# Patient Record
Sex: Female | Born: 1960 | Race: White | Hispanic: No | Marital: Married | Smoking: Never smoker
Health system: Southern US, Community
[De-identification: ages and names within clinical notes are randomized; demographics above are authoritative.]

## PROBLEM LIST (undated history)

## (undated) DIAGNOSIS — Z9889 Other specified postprocedural states: Secondary | ICD-10-CM

## (undated) DIAGNOSIS — M1990 Unspecified osteoarthritis, unspecified site: Secondary | ICD-10-CM

## (undated) DIAGNOSIS — E079 Disorder of thyroid, unspecified: Secondary | ICD-10-CM

## (undated) DIAGNOSIS — I10 Essential (primary) hypertension: Secondary | ICD-10-CM

## (undated) DIAGNOSIS — R51 Headache: Secondary | ICD-10-CM

## (undated) DIAGNOSIS — R112 Nausea with vomiting, unspecified: Secondary | ICD-10-CM

## (undated) DIAGNOSIS — R002 Palpitations: Secondary | ICD-10-CM

## (undated) HISTORY — PX: KNEE SURGERY: SHX244

## (undated) HISTORY — PX: COLONOSCOPY W/ POLYPECTOMY: SHX1380

## (undated) HISTORY — DX: Disorder of thyroid, unspecified: E07.9

## (undated) HISTORY — PX: APPENDECTOMY: SHX54

## (undated) HISTORY — PX: OTHER SURGICAL HISTORY: SHX169

---

## 2011-10-16 DIAGNOSIS — R3129 Other microscopic hematuria: Secondary | ICD-10-CM | POA: Insufficient documentation

## 2012-08-05 ENCOUNTER — Other Ambulatory Visit: Payer: Self-pay

## 2012-08-05 ENCOUNTER — Encounter (HOSPITAL_COMMUNITY): Payer: Self-pay | Admitting: Emergency Medicine

## 2012-08-05 ENCOUNTER — Encounter (HOSPITAL_COMMUNITY): Payer: Self-pay | Admitting: Anesthesiology

## 2012-08-05 ENCOUNTER — Emergency Department (HOSPITAL_COMMUNITY): Payer: Managed Care, Other (non HMO)

## 2012-08-05 ENCOUNTER — Encounter (HOSPITAL_COMMUNITY): Admission: EM | Disposition: A | Discharge status: Home / Self Care | Payer: Self-pay | Source: Home / Self Care

## 2012-08-05 ENCOUNTER — Inpatient Hospital Stay (HOSPITAL_COMMUNITY)
Admission: EM | Admit: 2012-08-05 | Discharge: 2012-08-08 | DRG: 340 | Disposition: A | Discharge status: Home / Self Care | Payer: Managed Care, Other (non HMO) | Attending: General Surgery | Admitting: General Surgery

## 2012-08-05 ENCOUNTER — Emergency Department (HOSPITAL_COMMUNITY): Payer: Managed Care, Other (non HMO) | Admitting: Anesthesiology

## 2012-08-05 DIAGNOSIS — I10 Essential (primary) hypertension: Secondary | ICD-10-CM | POA: Diagnosis present

## 2012-08-05 DIAGNOSIS — K3533 Acute appendicitis with perforation and localized peritonitis, with abscess: Principal | ICD-10-CM | POA: Diagnosis present

## 2012-08-05 DIAGNOSIS — K352 Acute appendicitis with generalized peritonitis, without abscess: Secondary | ICD-10-CM

## 2012-08-05 DIAGNOSIS — K3580 Unspecified acute appendicitis: Secondary | ICD-10-CM

## 2012-08-05 HISTORY — DX: Other specified postprocedural states: Z98.890

## 2012-08-05 HISTORY — PX: LAPAROSCOPIC APPENDECTOMY: SHX408

## 2012-08-05 HISTORY — DX: Essential (primary) hypertension: I10

## 2012-08-05 HISTORY — DX: Other specified postprocedural states: R11.2

## 2012-08-05 LAB — URINALYSIS, ROUTINE W REFLEX MICROSCOPIC
Glucose, UA: NEGATIVE mg/dL
Leukocytes, UA: NEGATIVE
Protein, ur: NEGATIVE mg/dL
Urobilinogen, UA: 0.2 mg/dL (ref 0.0–1.0)

## 2012-08-05 LAB — COMPREHENSIVE METABOLIC PANEL
ALT: 13 U/L (ref 0–35)
BUN: 9 mg/dL (ref 6–23)
CO2: 23 mEq/L (ref 19–32)
Calcium: 9.7 mg/dL (ref 8.4–10.5)
Creatinine, Ser: 0.64 mg/dL (ref 0.50–1.10)
GFR calc Af Amer: >90 mL/min (ref >90)
GFR calc non Af Amer: >90 mL/min (ref >90)
Glucose, Bld: 97 mg/dL (ref 70–99)

## 2012-08-05 LAB — CBC WITH DIFFERENTIAL/PLATELET
Basophils Absolute: 0 10*3/uL (ref 0.0–0.1)
Basophils Relative: 0 % (ref 0–1)
Hemoglobin: 14.6 g/dL (ref 12.0–15.0)
Lymphocytes Relative: 13 % (ref 12–46)
MCHC: 35.3 g/dL (ref 30.0–36.0)
Monocytes Relative: 7 % (ref 3–12)
Neutro Abs: 10.5 10*3/uL — ABNORMAL HIGH (ref 1.7–7.7)
Neutrophils Relative %: 79 % — ABNORMAL HIGH (ref 43–77)
RBC: 4.57 MIL/uL (ref 3.87–5.11)
WBC: 13.3 10*3/uL — ABNORMAL HIGH (ref 4.0–10.5)

## 2012-08-05 LAB — URINE MICROSCOPIC-ADD ON

## 2012-08-05 SURGERY — APPENDECTOMY, LAPAROSCOPIC
Anesthesia: General | Wound class: Clean Contaminated

## 2012-08-05 MED ORDER — CIPROFLOXACIN IN D5W 400 MG/200ML IV SOLN
400.0000 mg | Freq: Two times a day (BID) | INTRAVENOUS | Status: DC
  Administered 2012-08-06 – 2012-08-08 (×5): 400 mg via INTRAVENOUS
  Filled 2012-08-05 (×6): qty 200

## 2012-08-05 MED ORDER — ENOXAPARIN SODIUM 40 MG/0.4ML ~~LOC~~ SOLN
40.0000 mg | Freq: Every day | SUBCUTANEOUS | Status: DC
  Administered 2012-08-06 – 2012-08-08 (×3): 40 mg via SUBCUTANEOUS
  Filled 2012-08-05 (×3): qty 0.4

## 2012-08-05 MED ORDER — HYDROMORPHONE HCL PF 1 MG/ML IJ SOLN
1.0000 mg | INTRAMUSCULAR | Status: DC | PRN
  Administered 2012-08-05 – 2012-08-08 (×8): 1 mg via INTRAVENOUS
  Filled 2012-08-05 (×8): qty 1

## 2012-08-05 MED ORDER — METRONIDAZOLE IN NACL 5-0.79 MG/ML-% IV SOLN
500.0000 mg | Freq: Three times a day (TID) | INTRAVENOUS | Status: DC
  Administered 2012-08-06 (×2): 500 mg via INTRAVENOUS
  Filled 2012-08-05 (×3): qty 100

## 2012-08-05 MED ORDER — IOHEXOL 300 MG/ML  SOLN
50.0000 mL | Freq: Once | INTRAMUSCULAR | Status: AC | PRN
  Administered 2012-08-05: 50 mL via ORAL

## 2012-08-05 MED ORDER — LACTATED RINGERS IV SOLN: INTRAVENOUS | Status: DC

## 2012-08-05 MED ORDER — NEOSTIGMINE METHYLSULFATE 1 MG/ML IJ SOLN
INTRAMUSCULAR | Status: DC | PRN
  Administered 2012-08-05: 3 mg via INTRAVENOUS

## 2012-08-05 MED ORDER — LACTATED RINGERS IV SOLN
INTRAVENOUS | Status: DC | PRN
  Administered 2012-08-05: 1000 mL via INTRAVENOUS

## 2012-08-05 MED ORDER — CIPROFLOXACIN IN D5W 400 MG/200ML IV SOLN
INTRAVENOUS | Status: AC
  Filled 2012-08-05: qty 200

## 2012-08-05 MED ORDER — HYDROMORPHONE HCL PF 1 MG/ML IJ SOLN
INTRAMUSCULAR | Status: AC
  Filled 2012-08-05: qty 1

## 2012-08-05 MED ORDER — FENTANYL CITRATE 0.05 MG/ML IJ SOLN
INTRAMUSCULAR | Status: DC | PRN
  Administered 2012-08-05 (×2): 50 ug via INTRAVENOUS
  Administered 2012-08-05: 100 ug via INTRAVENOUS
  Administered 2012-08-05: 50 ug via INTRAVENOUS

## 2012-08-05 MED ORDER — METRONIDAZOLE IN NACL 5-0.79 MG/ML-% IV SOLN
500.0000 mg | Freq: Once | INTRAVENOUS | Status: AC
  Administered 2012-08-05: 500 mg via INTRAVENOUS
  Filled 2012-08-05: qty 100

## 2012-08-05 MED ORDER — BUPIVACAINE-EPINEPHRINE 0.25% -1:200000 IJ SOLN
INTRAMUSCULAR | Status: DC | PRN
  Administered 2012-08-05: 20 mL

## 2012-08-05 MED ORDER — FENTANYL CITRATE 0.05 MG/ML IJ SOLN: 100.0000 ug | Freq: Once | INTRAMUSCULAR | Status: DC

## 2012-08-05 MED ORDER — PROMETHAZINE HCL 25 MG/ML IJ SOLN
INTRAMUSCULAR | Status: AC
  Filled 2012-08-05: qty 1

## 2012-08-05 MED ORDER — 0.9 % SODIUM CHLORIDE (POUR BTL) OPTIME
TOPICAL | Status: DC | PRN
  Administered 2012-08-05: 1000 mL

## 2012-08-05 MED ORDER — HYDROMORPHONE HCL PF 1 MG/ML IJ SOLN
0.2500 mg | INTRAMUSCULAR | Status: DC | PRN
  Administered 2012-08-05 (×3): 0.25 mg via INTRAVENOUS

## 2012-08-05 MED ORDER — CIPROFLOXACIN IN D5W 400 MG/200ML IV SOLN
400.0000 mg | INTRAVENOUS | Status: AC
  Administered 2012-08-05: 400 mg via INTRAVENOUS

## 2012-08-05 MED ORDER — SODIUM CHLORIDE 0.9 % IV SOLN
INTRAVENOUS | Status: DC
  Administered 2012-08-05 – 2012-08-06 (×3): via INTRAVENOUS

## 2012-08-05 MED ORDER — DEXAMETHASONE SODIUM PHOSPHATE 4 MG/ML IJ SOLN
INTRAMUSCULAR | Status: DC | PRN
  Administered 2012-08-05: 8 mg via INTRAVENOUS

## 2012-08-05 MED ORDER — METOPROLOL SUCCINATE ER 25 MG PO TB24
25.0000 mg | ORAL_TABLET | Freq: Every morning | ORAL | Status: DC
  Administered 2012-08-06 – 2012-08-08 (×3): 25 mg via ORAL
  Filled 2012-08-05 (×3): qty 1

## 2012-08-05 MED ORDER — PROPOFOL 10 MG/ML IV BOLUS
INTRAVENOUS | Status: DC | PRN
  Administered 2012-08-05: 200 mg via INTRAVENOUS

## 2012-08-05 MED ORDER — ONDANSETRON HCL 4 MG/2ML IJ SOLN: 4.0000 mg | Freq: Four times a day (QID) | INTRAMUSCULAR | Status: DC | PRN

## 2012-08-05 MED ORDER — LIDOCAINE HCL (CARDIAC) 20 MG/ML IV SOLN
INTRAVENOUS | Status: DC | PRN
  Administered 2012-08-05: 80 mg via INTRAVENOUS

## 2012-08-05 MED ORDER — SUCCINYLCHOLINE CHLORIDE 20 MG/ML IJ SOLN
INTRAMUSCULAR | Status: DC | PRN
  Administered 2012-08-05: 120 mg via INTRAVENOUS

## 2012-08-05 MED ORDER — SODIUM CHLORIDE 0.9 % IV SOLN
INTRAVENOUS | Status: DC | PRN
  Administered 2012-08-05: 19:00:00 via INTRAVENOUS

## 2012-08-05 MED ORDER — ONDANSETRON HCL 4 MG PO TABS: 4.0000 mg | ORAL_TABLET | Freq: Four times a day (QID) | ORAL | Status: DC | PRN

## 2012-08-05 MED ORDER — ONDANSETRON HCL 4 MG/2ML IJ SOLN: 4.0000 mg | Freq: Once | INTRAMUSCULAR | Status: DC

## 2012-08-05 MED ORDER — OXYCODONE-ACETAMINOPHEN 5-325 MG PO TABS
1.0000 | ORAL_TABLET | ORAL | Status: DC | PRN
  Filled 2012-08-05: qty 1
  Filled 2012-08-05: qty 2

## 2012-08-05 MED ORDER — ACETAMINOPHEN 10 MG/ML IV SOLN
INTRAVENOUS | Status: AC
  Filled 2012-08-05: qty 100

## 2012-08-05 MED ORDER — ROCURONIUM BROMIDE 100 MG/10ML IV SOLN
INTRAVENOUS | Status: DC | PRN
  Administered 2012-08-05: 5 mg via INTRAVENOUS
  Administered 2012-08-05: 30 mg via INTRAVENOUS

## 2012-08-05 MED ORDER — GLYCOPYRROLATE 0.2 MG/ML IJ SOLN
INTRAMUSCULAR | Status: DC | PRN
  Administered 2012-08-05: 0.4 mg via INTRAVENOUS

## 2012-08-05 MED ORDER — BUPIVACAINE-EPINEPHRINE PF 0.25-1:200000 % IJ SOLN
INTRAMUSCULAR | Status: AC
  Filled 2012-08-05: qty 30

## 2012-08-05 MED ORDER — IOHEXOL 300 MG/ML  SOLN
100.0000 mL | Freq: Once | INTRAMUSCULAR | Status: AC | PRN
  Administered 2012-08-05: 100 mL via INTRAVENOUS

## 2012-08-05 MED ORDER — PROMETHAZINE HCL 25 MG/ML IJ SOLN
6.2500 mg | INTRAMUSCULAR | Status: DC | PRN
  Administered 2012-08-05: 12.5 mg via INTRAVENOUS

## 2012-08-05 MED ORDER — ACETAMINOPHEN 10 MG/ML IV SOLN
INTRAVENOUS | Status: DC | PRN
  Administered 2012-08-05: 1000 mg via INTRAVENOUS

## 2012-08-05 MED ORDER — ONDANSETRON HCL 4 MG/2ML IJ SOLN
INTRAMUSCULAR | Status: DC | PRN
  Administered 2012-08-05: 4 mg via INTRAVENOUS

## 2012-08-05 SURGICAL SUPPLY — 40 items
APPLIER CLIP ROT 10 11.4 M/L (STAPLE)
CANISTER SUCTION 2500CC (MISCELLANEOUS) ×2 IMPLANT
CLIP APPLIE ROT 10 11.4 M/L (STAPLE) IMPLANT
CLOTH BEACON ORANGE TIMEOUT ST (SAFETY) ×2 IMPLANT
CUTTER FLEX LINEAR 45M (STAPLE) ×2 IMPLANT
DECANTER SPIKE VIAL GLASS SM (MISCELLANEOUS) IMPLANT
DERMABOND ADVANCED (GAUZE/BANDAGES/DRESSINGS) ×1
DERMABOND ADVANCED .7 DNX12 (GAUZE/BANDAGES/DRESSINGS) ×1 IMPLANT
DRAIN CHANNEL 19F RND (DRAIN) ×2 IMPLANT
DRAPE LAPAROSCOPIC ABDOMINAL (DRAPES) ×2 IMPLANT
DRAPE WARM FLUID 44X44 (DRAPE) IMPLANT
ELECT REM PT RETURN 9FT ADLT (ELECTROSURGICAL) ×2
ELECTRODE REM PT RTRN 9FT ADLT (ELECTROSURGICAL) ×1 IMPLANT
ENDOLOOP SUT PDS II  0 18 (SUTURE)
ENDOLOOP SUT PDS II 0 18 (SUTURE) IMPLANT
EVACUATOR DRAINAGE 10X20 100CC (DRAIN) ×1 IMPLANT
EVACUATOR SILICONE 100CC (DRAIN) ×1
GLOVE BIOGEL PI IND STRL 7.0 (GLOVE) ×1 IMPLANT
GLOVE BIOGEL PI INDICATOR 7.0 (GLOVE) ×1
GLOVE INDICATOR 8.0 STRL GRN (GLOVE) ×4 IMPLANT
GLOVE SS BIOGEL STRL SZ 8 (GLOVE) ×1 IMPLANT
GLOVE SUPERSENSE BIOGEL SZ 8 (GLOVE) ×1
GOWN STRL NON-REIN LRG LVL3 (GOWN DISPOSABLE) ×2 IMPLANT
GOWN STRL REIN XL XLG (GOWN DISPOSABLE) ×4 IMPLANT
HAND ACTIVATED (MISCELLANEOUS) IMPLANT
KIT BASIN OR (CUSTOM PROCEDURE TRAY) ×2 IMPLANT
PENCIL BUTTON HOLSTER BLD 10FT (ELECTRODE) ×2 IMPLANT
POUCH SPECIMEN RETRIEVAL 10MM (ENDOMECHANICALS) ×2 IMPLANT
RELOAD 45 VASCULAR/THIN (ENDOMECHANICALS) IMPLANT
RELOAD STAPLE TA45 3.5 REG BLU (ENDOMECHANICALS) ×2 IMPLANT
SET IRRIG TUBING LAPAROSCOPIC (IRRIGATION / IRRIGATOR) ×2 IMPLANT
SUT ETHILON 2 0 PS N (SUTURE) ×2 IMPLANT
SUT MNCRL AB 4-0 PS2 18 (SUTURE) ×2 IMPLANT
TOWEL OR 17X26 10 PK STRL BLUE (TOWEL DISPOSABLE) ×2 IMPLANT
TRAY FOLEY CATH 14FRSI W/METER (CATHETERS) ×2 IMPLANT
TRAY LAP CHOLE (CUSTOM PROCEDURE TRAY) ×2 IMPLANT
TROCAR BLADELESS OPT 5 75 (ENDOMECHANICALS) ×2 IMPLANT
TROCAR XCEL 12X100 BLDLESS (ENDOMECHANICALS) ×2 IMPLANT
TROCAR XCEL BLUNT TIP 100MML (ENDOMECHANICALS) ×2 IMPLANT
TUBING INSUFFLATION 10FT LAP (TUBING) ×2 IMPLANT

## 2012-08-05 NOTE — ED Provider Notes (Signed)
History     CSN: [id number]  Arrival date & time 08/05/12  1453   First MD Initiated Contact with Patient 08/05/12 1512      Chief Complaint  Patient presents with  . Abdominal Pain     HPI Patient presents to the emergency department with right lower quadrant abdominal pain which started on Tuesday has progressively worsened.  Patient's had no documented fever.  Has had some anorexia but no nausea or vomiting.  Had a bowel movement this morning that was hard.  Patient was seen in urgent care and sent here for evaluation and workup for appendicitis.  No lab work was done.  Patient has no previous surgical history or significant medical history. Past Medical History  Diagnosis Date  . Hypertension   . PONV (postoperative nausea and vomiting)     Past Surgical History  Procedure Laterality Date  . Cesarean section    . Knee surgery      Left knee - meniscus tear  . Wisdom teeth    . Laparoscopic appendectomy N/A 08/05/2012    Procedure: APPENDECTOMY LAPAROSCOPIC;  Surgeon: Clovis Pu. Cornett, MD;  Location: [facility];  Service: General;  Laterality: N/A;    History reviewed. No pertinent family history.  History  Substance Use Topics  . Smoking status: Never Smoker   . Smokeless tobacco: Never Used  . Alcohol Use: Yes     Comment: occasional    OB History   Grav Para Term Preterm Abortions TAB SAB Ect Mult Living                  Review of Systems All other systems reviewed and are negative Allergies  Penicillins  Home Medications   Current Outpatient Rx  Name  Route  Sig  Dispense  Refill  . metoprolol succinate (TOPROL-XL) 25 MG 24 hr tablet   Oral   Take 25 mg by mouth every morning.         . Sennosides (EX-LAX) 15 MG CHEW   Oral   Chew 30 mg by mouth once.         Marland Kitchen acetaminophen (TYLENOL) 325 MG tablet   Oral   Take 1-2 tablets (325-650 mg total) by mouth every 6 (six) hours as needed.         Marland Kitchen HYDROcodone-acetaminophen (NORCO/VICODIN) 5-325  MG per tablet   Oral   Take 1-2 tablets by mouth every 4 (four) hours as needed.   30 tablet   0   . ondansetron (ZOFRAN) 4 MG tablet   Oral   Take 1 tablet (4 mg total) by mouth every 6 (six) hours as needed for nausea.   15 tablet   0     BP 115/75  Pulse 63  Temp(Src) 97.6 F (36.4 C) (Oral)  Resp 16  Ht 6' (1.829 m)  Wt 215 lb (97.523 kg)  BMI 29.15 kg/m2  SpO2 98%  Physical Exam  Nursing note and vitals reviewed. Constitutional: She is oriented to person, place, and time. She appears well-developed and well-nourished. No distress.  HENT:  Head: Normocephalic and atraumatic.  Eyes: Pupils are equal, round, and reactive to light.  Neck: Normal range of motion.  Cardiovascular: Normal rate and intact distal pulses.   Pulmonary/Chest: No respiratory distress.  Abdominal: Normal appearance. She exhibits no distension. There is tenderness in the right lower quadrant. There is no rebound and no guarding.  Musculoskeletal: Normal range of motion.  Neurological: She is alert and oriented to  person, place, and time. No cranial nerve deficit.  Skin: Skin is warm and dry. No rash noted.  Psychiatric: She has a normal mood and affect. Her behavior is normal.    ED Course  Procedures (including critical care time) Patient did not want any pain medication at the present time. Labs Reviewed    Results for orders placed during the hospital encounter of 08/05/12  COMPREHENSIVE METABOLIC PANEL      Result Value Range   Sodium 139  135 - 145 mEq/L   Potassium 3.5  3.5 - 5.1 mEq/L   Chloride 103  96 - 112 mEq/L   CO2 23  19 - 32 mEq/L   Glucose, Bld 97  70 - 99 mg/dL   BUN 9  6 - 23 mg/dL   Creatinine, Ser 5.78  0.50 - 1.10 mg/dL   Calcium 9.7  8.4 - 46.9 mg/dL   Total Protein 8.0  6.0 - 8.3 g/dL   Albumin 4.2  3.5 - 5.2 g/dL   AST 13  0 - 37 U/L   ALT 13  0 - 35 U/L   Alkaline Phosphatase 78  39 - 117 U/L   Total Bilirubin 0.5  0.3 - 1.2 mg/dL   GFR calc non Af Amer  >90  >90 mL/min   GFR calc Af Amer >90  >90 mL/min  CBC WITH DIFFERENTIAL      Result Value Range   WBC 13.3 (*) 4.0 - 10.5 K/uL   RBC 4.57  3.87 - 5.11 MIL/uL   Hemoglobin 14.6  12.0 - 15.0 g/dL   HCT 62.9  52.8 - 41.3 %   MCV 90.6  78.0 - 100.0 fL   MCH 31.9  26.0 - 34.0 pg   MCHC 35.3  30.0 - 36.0 g/dL   RDW 24.4  01.0 - 27.2 %   Platelets 323  150 - 400 K/uL   Neutrophils Relative % 79 (*) 43 - 77 %   Neutro Abs 10.5 (*) 1.7 - 7.7 K/uL   Lymphocytes Relative 13  12 - 46 %   Lymphs Abs 1.7  0.7 - 4.0 K/uL   Monocytes Relative 7  3 - 12 %   Monocytes Absolute 0.9  0.1 - 1.0 K/uL   Eosinophils Relative 1  0 - 5 %   Eosinophils Absolute 0.2  0.0 - 0.7 K/uL   Basophils Relative 0  0 - 1 %   Basophils Absolute 0.0  0.0 - 0.1 K/uL  LIPASE, BLOOD      Result Value Range   Lipase 32  11 - 59 U/L  URINALYSIS, ROUTINE W REFLEX MICROSCOPIC      Result Value Range   Color, Urine YELLOW  YELLOW   APPearance CLOUDY (*) CLEAR   Specific Gravity, Urine 1.016  1.005 - 1.030   pH 5.0  5.0 - 8.0   Glucose, UA NEGATIVE  NEGATIVE mg/dL   Hgb urine dipstick TRACE (*) NEGATIVE   Bilirubin Urine NEGATIVE  NEGATIVE   Ketones, ur NEGATIVE  NEGATIVE mg/dL   Protein, ur NEGATIVE  NEGATIVE mg/dL   Urobilinogen, UA 0.2  0.0 - 1.0 mg/dL   Nitrite NEGATIVE  NEGATIVE   Leukocytes, UA NEGATIVE  NEGATIVE                Ct Abdomen Pelvis W Contrast  08/05/2012   *RADIOLOGY REPORT*  Clinical Data: Right lower quadrant abdominal pain for 6 days.  The [facility]  CT ABDOMEN AND PELVIS WITH CONTRAST  Technique:  Multidetector CT imaging of the abdomen and pelvis was performed following the standard protocol during bolus administration of intravenous contrast.  Contrast: OMNIPAQUE IOHEXOL 300 MG/ML  SOLN Oral contrast was also administered.  Comparison: None.  Findings: Large appendicolith at the base of the appendix measuring approximately  10 mm.  This causes obstruction of the appendix, with appendiceal dilation up to approximately 15 mm.  Periappendiceal inflammation/edema.  No abnormal fluid collection to suggest abscess.  Normal appearing liver with anatomic variant of the left lobe extends well across midline in the left upper quadrant.  Normal spleen, pancreas, adrenal glands, and kidneys.  Calcified approximate 9 mm gallstone in the otherwise normal-appearing gallbladder.  No biliary ductal dilation.  Mild distal abdominal aortic atherosclerosis.  No significant lymphadenopathy.  Normal-appearing uterus.  Mild enlargement of the right ovary measuring approximately 4.8 x 3.6 cm without a discrete mass. Normal-appearing left ovary.  Several Nabothian cysts on the uterine cervix.  Phleboliths low both sides of the pelvis.  Urinary bladder unremarkable.  Bone window images demonstrate mild lower thoracic spondylosis and degenerative disc disease at L4-5 and L5-S1. Visualized lung bases clear apart from the expected dependent atelectasis posteriorly in the lower lobes and minimal lingular scar.  Heart size normal.  IMPRESSION:  1.  Acute appendicitis.  No evidence of periappendiceal abscess. Large appendicolith at the base of the appendix. 2.  Mildly enlarged right ovary without a discrete mass.  Non- emergent pelvic ultrasound after treatment of the acute appendicitis may be helpful in further evaluation, if the ovary cannot be adequately visualized at the time of surgery. 3.  Cholelithiasis without CT evidence for acute cholecystitis.  These results were called by telephone on 08/05/2012 at 1645 hours to Dr. Radford Pax of the emergency department, who verbally acknowledged these results.   Original Report Authenticated By: Hulan Saas, M.D.      1. Acute appendicitis       MDM  Case discussed with surgery who will come to ED and evaluate the patient.        Nelia Shi, MD 08/16/12 1100

## 2012-08-05 NOTE — ED Notes (Signed)
[id number]<[id number]> Expected date:<BR> Expected time:<BR> Means of arrival:<BR> Comments:<BR> Nicole Campos, sent from urgent care. 52 yo F, appendicitis. 4 days RLQ guarding

## 2012-08-05 NOTE — Anesthesia Preprocedure Evaluation (Addendum)
Anesthesia Evaluation  Patient identified by MRN, date of birth, ID band Patient awake    Reviewed: Allergy & Precautions, H&P , NPO status , Patient's Chart, lab work & pertinent test results  History of Anesthesia Complications (+) PONV  Airway Mallampati: II TM Distance: >3 FB Neck ROM: Full    Dental  (+) Teeth Intact and Dental Advisory Given   Pulmonary neg pulmonary ROS,  breath sounds clear to auscultation  Pulmonary exam normal       Cardiovascular hypertension, Pt. on medications negative cardio ROS  Rhythm:Regular Rate:Normal     Neuro/Psych negative neurological ROS  negative psych ROS   GI/Hepatic negative GI ROS, Neg liver ROS,   Endo/Other  negative endocrine ROS  Renal/GU negative Renal ROS  negative genitourinary   Musculoskeletal negative musculoskeletal ROS (+)   Abdominal   Peds  Hematology negative hematology ROS (+)   Anesthesia Other Findings   Reproductive/Obstetrics                           Anesthesia Physical Anesthesia Plan  ASA: II  Anesthesia Plan: General   Post-op Pain Management:    Induction: Intravenous, Rapid sequence and Cricoid pressure planned  Airway Management Planned: Oral ETT  Additional Equipment:   Intra-op Plan:   Post-operative Plan: Extubation in OR  Informed Consent: I have reviewed the patients History and Physical, chart, labs and discussed the procedure including the risks, benefits and alternatives for the proposed anesthesia with the patient or authorized representative who has indicated his/her understanding and acceptance.   Dental advisory given  Plan Discussed with: CRNA  Anesthesia Plan Comments:         Anesthesia Quick Evaluation

## 2012-08-05 NOTE — ED Notes (Signed)
Pt sent [facility] from [facility].  Pt told to come here for possible appendicitis. Pt states she has had pain since Tuesday. Pt states she has pain in RLQ.  Denies n/v/d.

## 2012-08-05 NOTE — Interval H&P Note (Signed)
History and Physical Interval Note:  08/05/2012 6:20 PM  Nicole Campos  has presented today for surgery, with the diagnosis of acute appendicitis  The various methods of treatment have been discussed with the patient and family. After consideration of risks, benefits and other options for treatment, the patient has consented to  Procedure(s): APPENDECTOMY LAPAROSCOPIC (N/A) as a surgical intervention .  The patient's history has been reviewed, patient examined, no change in status, stable for surgery.  I have reviewed the patient's chart and labs.  Questions were answered to the patient's satisfaction.     [name] A.

## 2012-08-05 NOTE — Op Note (Signed)
Appendectomy, Lap, Procedure Note  Indications: The patient presented with a history of right-sided abdominal pain. A CT revealed findings consistent with acute appendicitis. The procedure has been discussed with the patient.  Alternative therapies have been discussed with the patient.  Operative risks include bleeding,  Infection,  Organ injury,  Nerve injury,  Blood vessel injury,  DVT,  Pulmonary embolism,  Death,  And possible reoperation.  Medical management risks include worsening of present situation.  The success of the procedure is 50 -90 % at treating patients symptoms.  The patient understands and agrees to proceed.  Pre-operative Diagnosis: Acute appendicitis with peritoneal abscess  Post-operative Diagnosis: Acute appendicitis with peritoneal abscess  Surgeon: Harriette Bouillon A.   Assistants: OR  Anesthesia: General endotracheal anesthesia and Local anesthesia 0.25.% bupivacaine, with epinephrine  ASA Class: 2  Procedure Details  The patient was seen again in the Holding Room. The risks, benefits, complications, treatment options, and expected outcomes were discussed with the patient and/or family. The possibilities of reaction to medication, pulmonary aspiration, perforation of viscus, bleeding, recurrent infection, finding a normal appendix, the need for additional procedures, failure to diagnose a condition, and creating a complication requiring transfusion or operation were discussed. There was concurrence with the proposed plan and informed consent was obtained. The site of surgery was properly noted/marked. The patient was taken to Operating Room, identified as Nicole Campos and the procedure verified as Appendectomy. A Time Out was held and the above information confirmed.  The patient was placed in the supine position and general anesthesia was induced, along with placement of orogastric tube, Venodyne boots, and a Foley catheter. The abdomen was prepped and draped in a sterile  fashion. A one centimeter infraumbilical incision was made and the peritoneal cavity was accessed using the OPEN  technique. The pneumoperitoneum was then established to steady pressure of 12 mmHg. A 12 mm port was placed through the umbilical incision. Additional 5 mm cannulas then placed in the left lower quadrant of the abdomen and half way between the umbilicus and pubic symphysis under direct vision. A careful evaluation of the entire abdomen was carried out. The patient was placed in Trendelenburg and left lateral decubitus position. The small intestines were retracted in the cephalad and left lateral direction away from the pelvis and right lower quadrant. The patient was found to have an enlarged and inflamed appendix that was extending into the pelvis. There was  evidence of CONTAINED  Perforation with abscess..  The appendix was carefully dissected. A window was made in the mesoappendix at the base of the appendix. A harmonic scalpel was used across the mesoappendix. The appendix was divided at its base using an endo-GIA stapler. The stump was tenuous but held staples and did not leak. Minimal appendiceal stump was left in place. The appendix was the center of the abscess cavity.   There was no evidence of bleeding, leakage, or complication after division of the appendix.  19 F drain placed in the abscess cavity RLQ. Irrigation was also performed and irrigate suctioned from the abdomen as well.  The umbilical port site was closed using 0 vicryl pursestring sutures fashion at the level of the fascia. The trocar site skin wounds were closed using skin staples.  Instrument, sponge, and needle counts were correct at the conclusion of the case.   Findings: The appendix was found to be inflamed. There were signs of necrosis.  There was perforation. There was abscess formation.  Estimated Blood Loss:  less than 50  mL         Drains: 19 F         Total IV Fluids: 1200 mL         Specimens:  APPENDIX         Complications:  None; patient tolerated the procedure well.         Disposition: PACU - hemodynamically stable.         Condition: stable

## 2012-08-05 NOTE — H&P (Signed)
Nicole Campos is an 52 y.o. female.   Chief Complaint: abdominal pain HPI: 6 day hx of diffuse now RLQ abdominal pain 7/10 located RLQ made worse with movement.  CT shows acute appendicitis without rupture.  Gallstone   Noted without inflammation and right ovary enlarged.  Some dyspepsia.   Past Medical History  Diagnosis Date  . Hypertension   . PONV (postoperative nausea and vomiting)     Past Surgical History  Procedure Laterality Date  . Cesarean section    . Knee surgery      Left knee - meniscus tear  . Wisdom teeth      History reviewed. No pertinent family history. Social History:  reports that she has never smoked. She has never used smokeless tobacco. She reports that  drinks alcohol. She reports that she does not use illicit drugs.  Allergies:  Allergies  Allergen Reactions  . Penicillins Itching     (Not in a hospital admission)  Results for orders placed during the hospital encounter of 08/05/12 (from the past 48 hour(s))  COMPREHENSIVE METABOLIC PANEL     Status: None   Collection Time    08/05/12  3:20 PM      Result Value Range   Sodium 139  135 - 145 mEq/L   Potassium 3.5  3.5 - 5.1 mEq/L   Chloride 103  96 - 112 mEq/L   CO2 23  19 - 32 mEq/L   Glucose, Bld 97  70 - 99 mg/dL   BUN 9  6 - 23 mg/dL   Creatinine, Ser 1.61  0.50 - 1.10 mg/dL   Calcium 9.7  8.4 - 09.6 mg/dL   Total Protein 8.0  6.0 - 8.3 g/dL   Albumin 4.2  3.5 - 5.2 g/dL   AST 13  0 - 37 U/L   ALT 13  0 - 35 U/L   Alkaline Phosphatase 78  39 - 117 U/L   Total Bilirubin 0.5  0.3 - 1.2 mg/dL   GFR calc non Af Amer >90  >90 mL/min   GFR calc Af Amer >90  >90 mL/min   Comment:            The eGFR has been calculated     using the CKD EPI equation.     This calculation has not been     validated in all clinical     situations.     eGFR's persistently     <90 mL/min signify     possible Chronic Kidney Disease.  CBC WITH DIFFERENTIAL     Status: Abnormal   Collection Time    08/05/12   3:20 PM      Result Value Range   WBC 13.3 (*) 4.0 - 10.5 K/uL   RBC 4.57  3.87 - 5.11 MIL/uL   Hemoglobin 14.6  12.0 - 15.0 g/dL   HCT 04.5  40.9 - 81.1 %   MCV 90.6  78.0 - 100.0 fL   MCH 31.9  26.0 - 34.0 pg   MCHC 35.3  30.0 - 36.0 g/dL   RDW 91.4  78.2 - 95.6 %   Platelets 323  150 - 400 K/uL   Neutrophils Relative % 79 (*) 43 - 77 %   Neutro Abs 10.5 (*) 1.7 - 7.7 K/uL   Lymphocytes Relative 13  12 - 46 %   Lymphs Abs 1.7  0.7 - 4.0 K/uL   Monocytes Relative 7  3 - 12 %   Monocytes Absolute  0.9  0.1 - 1.0 K/uL   Eosinophils Relative 1  0 - 5 %   Eosinophils Absolute 0.2  0.0 - 0.7 K/uL   Basophils Relative 0  0 - 1 %   Basophils Absolute 0.0  0.0 - 0.1 K/uL  LIPASE, BLOOD     Status: None   Collection Time    08/05/12  3:20 PM      Result Value Range   Lipase 32  11 - 59 U/L  URINALYSIS, ROUTINE W REFLEX MICROSCOPIC     Status: Abnormal   Collection Time    08/05/12  4:18 PM      Result Value Range   Color, Urine YELLOW  YELLOW   APPearance CLOUDY (*) CLEAR   Specific Gravity, Urine 1.016  1.005 - 1.030   pH 5.0  5.0 - 8.0   Glucose, UA NEGATIVE  NEGATIVE mg/dL   Hgb urine dipstick TRACE (*) NEGATIVE   Bilirubin Urine NEGATIVE  NEGATIVE   Ketones, ur NEGATIVE  NEGATIVE mg/dL   Protein, ur NEGATIVE  NEGATIVE mg/dL   Urobilinogen, UA 0.2  0.0 - 1.0 mg/dL   Nitrite NEGATIVE  NEGATIVE   Leukocytes, UA NEGATIVE  NEGATIVE  URINE MICROSCOPIC-ADD ON     Status: None   Collection Time    08/05/12  4:18 PM      Result Value Range   WBC, UA 0-2  <3 WBC/hpf   Ct Abdomen Pelvis W Contrast  08/05/2012   *RADIOLOGY REPORT*  Clinical Data: Right lower quadrant abdominal pain for 6 days.  The [facility]  CT ABDOMEN AND PELVIS WITH CONTRAST  Technique:  Multidetector CT imaging of the abdomen and pelvis was performed following the standard protocol during bolus administration of intravenous contrast.  Contrast:  OMNIPAQUE IOHEXOL 300 MG/ML  SOLN Oral contrast was also administered.  Comparison: None.  Findings: Large appendicolith at the base of the appendix measuring approximately 10 mm.  This causes obstruction of the appendix, with appendiceal dilation up to approximately 15 mm.  Periappendiceal inflammation/edema.  No abnormal fluid collection to suggest abscess.  Normal appearing liver with anatomic variant of the left lobe extends well across midline in the left upper quadrant.  Normal spleen, pancreas, adrenal glands, and kidneys.  Calcified approximate 9 mm gallstone in the otherwise normal-appearing gallbladder.  No biliary ductal dilation.  Mild distal abdominal aortic atherosclerosis.  No significant lymphadenopathy.  Normal-appearing uterus.  Mild enlargement of the right ovary measuring approximately 4.8 x 3.6 cm without a discrete mass. Normal-appearing left ovary.  Several Nabothian cysts on the uterine cervix.  Phleboliths low both sides of the pelvis.  Urinary bladder unremarkable.  Bone window images demonstrate mild lower thoracic spondylosis and degenerative disc disease at L4-5 and L5-S1. Visualized lung bases clear apart from the expected dependent atelectasis posteriorly in the lower lobes and minimal lingular scar.  Heart size normal.  IMPRESSION:  1.  Acute appendicitis.  No evidence of periappendiceal abscess. Large appendicolith at the base of the appendix. 2.  Mildly enlarged right ovary without a discrete mass.  Non- emergent pelvic ultrasound after treatment of the acute appendicitis may be helpful in further evaluation, if the ovary cannot be adequately visualized at the time of surgery. 3.  Cholelithiasis without CT evidence for acute cholecystitis.  These results were called by telephone on 08/05/2012 at 1645 hours to Dr. Radford Pax of the emergency department, who verbally acknowledged these results.  Original Report Authenticated By: Hulan Saas, M.D.    Review of Systems   Constitutional: Negative for fever and chills.  HENT: Negative.   Eyes: Negative.   Respiratory: Negative.   Gastrointestinal: Positive for abdominal pain.  Genitourinary: Negative.   Musculoskeletal: Negative.   Skin: Negative.   Neurological: Negative.   Endo/Heme/Allergies: Negative.   Psychiatric/Behavioral: Negative.     Blood pressure 144/78, pulse 111, temperature 98.2 F (36.8 C), temperature source Oral, resp. rate 18, SpO2 96.00%. Physical Exam  Constitutional: She is oriented to person, place, and time. She appears well-developed and well-nourished.  HENT:  Head: Normocephalic and atraumatic.  Eyes: EOM are normal. Pupils are equal, round, and reactive to light.  Neck: Normal range of motion. Neck supple.  Cardiovascular: Normal rate and regular rhythm.   Respiratory: Effort normal and breath sounds normal.  GI: There is tenderness in the right lower quadrant. There is rebound and guarding. There is no rigidity.  Musculoskeletal: Normal range of motion.  Neurological: She is alert and oriented to person, place, and time.  Skin: Skin is warm and dry.  Psychiatric: She has a normal mood and affect. Her behavior is normal. Judgment and thought content normal.     Assessment/Plan Acute appendicitis Discussed the disease process,  Treatment options both operative and non operative,  Long term expectations and complications of each.  She would like to proceed with laparoscopic appendectomy.  Risks  Include but not exclusive of bleeding,  Infection,  Organ injury,  Open procedure,  Abscess formation wpound complications  and need for other procedures. She agrees to proceed.  Takiyah Bohnsack A. 08/05/2012, 5:56 PM

## 2012-08-05 NOTE — Transfer of Care (Signed)
Immediate Anesthesia Transfer of Care Note  Patient: Nicole Campos  Procedure(s) Performed: Procedure(s): APPENDECTOMY LAPAROSCOPIC (N/A)  Patient Location: PACU  Anesthesia Type:General  Level of Consciousness: awake, alert , oriented and patient cooperative  Airway & Oxygen Therapy: Patient Spontanous Breathing and Patient connected to face mask oxygen  Post-op Assessment: Report given to PACU RN, Post -op Vital signs reviewed and stable and Patient moving all extremities X 4  Post vital signs: Reviewed and stable  Complications: No apparent anesthesia complications

## 2012-08-05 NOTE — Preoperative (Signed)
Beta Blockers   Reason not to administer Beta Blockers:Not Applicable 

## 2012-08-05 NOTE — Anesthesia Postprocedure Evaluation (Signed)
  Anesthesia Post-op Note  Patient: Nicole Campos  Procedure(s) Performed: Procedure(s): APPENDECTOMY LAPAROSCOPIC (N/A)  Patient Location: PACU  Anesthesia Type:General  Level of Consciousness: awake, alert , oriented and patient cooperative  Airway and Oxygen Therapy: Patient Spontanous Breathing and Patient connected to face mask oxygen  Post-op Pain: mild  Post-op Assessment: Post-op Vital signs reviewed, Patient's Cardiovascular Status Stable, Respiratory Function Stable and Patent Airway  Post-op Vital Signs: Reviewed and stable  Complications: No apparent anesthesia complications

## 2012-08-06 ENCOUNTER — Encounter (HOSPITAL_COMMUNITY): Payer: Self-pay | Admitting: Surgery

## 2012-08-06 LAB — CBC
MCH: 31.7 pg (ref 26.0–34.0)
MCV: 92.1 fL (ref 78.0–100.0)
Platelets: 276 10*3/uL (ref 150–400)
RDW: 12.2 % (ref 11.5–15.5)

## 2012-08-06 LAB — BASIC METABOLIC PANEL
CO2: 27 mEq/L (ref 19–32)
Calcium: 9.1 mg/dL (ref 8.4–10.5)
Creatinine, Ser: 0.66 mg/dL (ref 0.50–1.10)
GFR calc Af Amer: >90 mL/min (ref >90)

## 2012-08-06 MED ORDER — CLINDAMYCIN PHOSPHATE 600 MG/50ML IV SOLN
600.0000 mg | Freq: Four times a day (QID) | INTRAVENOUS | Status: DC
  Administered 2012-08-06 – 2012-08-08 (×8): 600 mg via INTRAVENOUS
  Filled 2012-08-06 (×10): qty 50

## 2012-08-06 NOTE — Progress Notes (Signed)
General Surgery [facility]  Patient seen and examined.  Discussed with Dr. Luisa Hart.  Agree with above note.  Velora Heckler, MD, [facility] Office: [phone]

## 2012-08-06 NOTE — Progress Notes (Signed)
Patient ID: Nicole Campos, female   DOB: [birth year], 52 y.o.   MRN: [id number]  Subjective: Pt feeling a little better than yesterday, up in the bathroom washing up.  Start to pass gas, no bm.  Feels bloated and burping.  No dysuria.  Objective:  Vital signs:  Filed Vitals:   08/05/12 2328 08/06/12 0036 08/06/12 0130 08/06/12 0521  BP: 111/71 106/68 115/68 127/72  Pulse: 73 80 57 63  Temp: 98.1 F (36.7 C) 98 F (36.7 C) 98 F (36.7 C) 97.6 F (36.4 C)  TempSrc: Oral Oral Oral Oral  Resp: 14 16 16 16   Height: 6' (1.829 m)     Weight: 215 lb (97.523 kg)     SpO2: 92% 94% 96% 98%      Intake/Output   Yesterday:  05/26 0701 - 05/27 0700 In: 2762.1 [P.O.:360; I.V.:2302.1; IV Piggyback:100] Out: 925 [Urine:825; Drains:100] This shift:     Physical Exam:  General: Pt awake/alert/oriented x3 in no acute distress Chest: CTA No chest wall pain w good excursion CV:  Pulses intact.  Regular rhythm MS: Normal AROM mjr joints.  No obvious deformity Abdomen: Soft.  Nondistended.  Mildly tender at incisions only.  No evidence of peritonitis.  No incarcerated hernias. Ext:  SCDs BLE.  No mjr edema.  No cyanosis Skin: No petechiae / purpura   Problem List:   Active Problems:   Acute appendicitis with peritoneal abscess    Results:   Labs: Results for orders placed during the hospital encounter of 08/05/12 (from the past 48 hour(s))  COMPREHENSIVE METABOLIC PANEL     Status: None   Collection Time    08/05/12  3:20 PM      Result Value Range   Sodium 139  135 - 145 mEq/L   Potassium 3.5  3.5 - 5.1 mEq/L   Chloride 103  96 - 112 mEq/L   CO2 23  19 - 32 mEq/L   Glucose, Bld 97  70 - 99 mg/dL   BUN 9  6 - 23 mg/dL   Creatinine, Ser 4.09  0.50 - 1.10 mg/dL   Calcium 9.7  8.4 - 81.1 mg/dL   Total Protein 8.0  6.0 - 8.3 g/dL   Albumin 4.2  3.5 - 5.2 g/dL   AST 13  0 - 37 U/L   ALT 13  0 - 35 U/L   Alkaline Phosphatase 78  39 - 117 U/L   Total Bilirubin 0.5  0.3 - 1.2  mg/dL   GFR calc non Af Amer >90  >90 mL/min   GFR calc Af Amer >90  >90 mL/min   Comment:            The eGFR has been calculated     using the CKD EPI equation.     This calculation has not been     validated in all clinical     situations.     eGFR's persistently     <90 mL/min signify     possible Chronic Kidney Disease.  CBC WITH DIFFERENTIAL     Status: Abnormal   Collection Time    08/05/12  3:20 PM      Result Value Range   WBC 13.3 (*) 4.0 - 10.5 K/uL   RBC 4.57  3.87 - 5.11 MIL/uL   Hemoglobin 14.6  12.0 - 15.0 g/dL   HCT 91.4  78.2 - 95.6 %   MCV 90.6  78.0 - 100.0 fL   MCH 31.9  26.0 -  34.0 pg   MCHC 35.3  30.0 - 36.0 g/dL   RDW 16.1  09.6 - 04.5 %   Platelets 323  150 - 400 K/uL   Neutrophils Relative % 79 (*) 43 - 77 %   Neutro Abs 10.5 (*) 1.7 - 7.7 K/uL   Lymphocytes Relative 13  12 - 46 %   Lymphs Abs 1.7  0.7 - 4.0 K/uL   Monocytes Relative 7  3 - 12 %   Monocytes Absolute 0.9  0.1 - 1.0 K/uL   Eosinophils Relative 1  0 - 5 %   Eosinophils Absolute 0.2  0.0 - 0.7 K/uL   Basophils Relative 0  0 - 1 %   Basophils Absolute 0.0  0.0 - 0.1 K/uL  LIPASE, BLOOD     Status: None   Collection Time    08/05/12  3:20 PM      Result Value Range   Lipase 32  11 - 59 U/L  URINALYSIS, ROUTINE W REFLEX MICROSCOPIC     Status: Abnormal   Collection Time    08/05/12  4:18 PM      Result Value Range   Color, Urine YELLOW  YELLOW   APPearance CLOUDY (*) CLEAR   Specific Gravity, Urine 1.016  1.005 - 1.030   pH 5.0  5.0 - 8.0   Glucose, UA NEGATIVE  NEGATIVE mg/dL   Hgb urine dipstick TRACE (*) NEGATIVE   Bilirubin Urine NEGATIVE  NEGATIVE   Ketones, ur NEGATIVE  NEGATIVE mg/dL   Protein, ur NEGATIVE  NEGATIVE mg/dL   Urobilinogen, UA 0.2  0.0 - 1.0 mg/dL   Nitrite NEGATIVE  NEGATIVE   Leukocytes, UA NEGATIVE  NEGATIVE  URINE MICROSCOPIC-ADD ON     Status: None   Collection Time    08/05/12  4:18 PM      Result Value Range   WBC, UA 0-2  <3 WBC/hpf  CBC      Status: Abnormal   Collection Time    08/06/12  4:40 AM      Result Value Range   WBC 11.4 (*) 4.0 - 10.5 K/uL   RBC 4.17  3.87 - 5.11 MIL/uL   Hemoglobin 13.2  12.0 - 15.0 g/dL   HCT 40.9  81.1 - 91.4 %   MCV 92.1  78.0 - 100.0 fL   MCH 31.7  26.0 - 34.0 pg   MCHC 34.4  30.0 - 36.0 g/dL   RDW 78.2  95.6 - 21.3 %   Platelets 276  150 - 400 K/uL  BASIC METABOLIC PANEL     Status: Abnormal   Collection Time    08/06/12  4:40 AM      Result Value Range   Sodium 138  135 - 145 mEq/L   Potassium 4.0  3.5 - 5.1 mEq/L   Chloride 103  96 - 112 mEq/L   CO2 27  19 - 32 mEq/L   Glucose, Bld 162 (*) 70 - 99 mg/dL   BUN 9  6 - 23 mg/dL   Creatinine, Ser 0.86  0.50 - 1.10 mg/dL   Calcium 9.1  8.4 - 57.8 mg/dL   GFR calc non Af Amer >90  >90 mL/min   GFR calc Af Amer >90  >90 mL/min   Comment:            The eGFR has been calculated     using the CKD EPI equation.     This calculation has not been  validated in all clinical     situations.     eGFR's persistently     <90 mL/min signify     possible Chronic Kidney Disease.    Imaging / Studies: Ct Abdomen Pelvis W Contrast  08/05/2012   *RADIOLOGY REPORT*  Clinical Data: Right lower quadrant abdominal pain for 6 days.  The patient sent to the emergency department from an [facility].  CT ABDOMEN AND PELVIS WITH CONTRAST  Technique:  Multidetector CT imaging of the abdomen and pelvis was performed following the standard protocol during bolus administration of intravenous contrast.  Contrast: OMNIPAQUE IOHEXOL 300 MG/ML  SOLN Oral contrast was also administered.  Comparison: None.  Findings: Large appendicolith at the base of the appendix measuring approximately 10 mm.  This causes obstruction of the appendix, with appendiceal dilation up to approximately 15 mm.  Periappendiceal inflammation/edema.  No abnormal fluid collection to suggest abscess.  Normal appearing liver with anatomic variant of the left lobe extends well  across midline in the left upper quadrant.  Normal spleen, pancreas, adrenal glands, and kidneys.  Calcified approximate 9 mm gallstone in the otherwise normal-appearing gallbladder.  No biliary ductal dilation.  Mild distal abdominal aortic atherosclerosis.  No significant lymphadenopathy.  Normal-appearing uterus.  Mild enlargement of the right ovary measuring approximately 4.8 x 3.6 cm without a discrete mass. Normal-appearing left ovary.  Several Nabothian cysts on the uterine cervix.  Phleboliths low both sides of the pelvis.  Urinary bladder unremarkable.  Bone window images demonstrate mild lower thoracic spondylosis and degenerative disc disease at L4-5 and L5-S1. Visualized lung bases clear apart from the expected dependent atelectasis posteriorly in the lower lobes and minimal lingular scar.  Heart size normal.  IMPRESSION:  1.  Acute appendicitis.  No evidence of periappendiceal abscess. Large appendicolith at the base of the appendix. 2.  Mildly enlarged right ovary without a discrete mass.  Non- emergent pelvic ultrasound after treatment of the acute appendicitis may be helpful in further evaluation, if the ovary cannot be adequately visualized at the time of surgery. 3.  Cholelithiasis without CT evidence for acute cholecystitis.  These results were called by telephone on 08/05/2012 at 1645 hours to Dr. Radford Pax of the emergency department, who verbally acknowledged these results.   Original Report Authenticated By: Hulan Saas, M.D.    Medications / Allergies: per chart  Antibiotics: Anti-infectives   Start     Dose/Rate Route Frequency Ordered Stop   08/06/12 0800  ciprofloxacin (CIPRO) IVPB 400 mg     400 mg 200 mL/hr over 60 Minutes Intravenous Every 12 hours 08/05/12 2245     08/06/12 0600  ciprofloxacin (CIPRO) IVPB 400 mg     400 mg 200 mL/hr over 60 Minutes Intravenous On call to O.R. 08/05/12 1928 08/06/12 0451   08/06/12 0400  metroNIDAZOLE (FLAGYL) IVPB 500 mg     500  mg 100 mL/hr over 60 Minutes Intravenous Every 8 hours 08/05/12 2245     08/05/12 1815  [MAR Hold]  metroNIDAZOLE (FLAGYL) IVPB 500 mg     (On MAR Hold since 08/05/12 1938)   500 mg 100 mL/hr over 60 Minutes Intravenous  Once 08/05/12 1804 08/05/12 2011      Assessment/Plan Lenna Gilford  52 y.o. female with acute appendicitis with peritoneal abscess 1 Day Post-Op  Procedure(s): APPENDECTOMY LAPAROSCOPIC -Drain total out 5/27 -cipro/flagyl IV -IVF -full liquid diet -Pain control -IS -CBC in AM  VTE prophylaxis -ambulate, SCDs, lovenox   Khang Hannum  Simisola Sandles, [facility] Pager [phone] Office [phone]  08/06/2012

## 2012-08-06 NOTE — Care Management Note (Signed)
    Page 1 of 1   08/06/2012     4:30:45 PM   CARE MANAGEMENT NOTE 08/06/2012  Patient:  Nicole Campos, Nicole Campos   Account Number:  [id number]  Date Initiated:  08/06/2012  Documentation initiated by:  Colleen Can  Subjective/Objective Assessment:   dx acute appendicitis     Action/Plan:   Cm spoke with patient and family. Plans are for her to return to her home where spouse will be caregiver. States she is ambulatory.   Anticipated DC Date:  08/08/2012   Anticipated DC Plan:  HOME/SELF CARE         Choice offered to / List presented to:             Status of service:  In process, will continue to follow Medicare Important Message given?   (If response is "NO", the following Medicare IM given date fields will be blank) Date Medicare IM given:   Date Additional Medicare IM given:    Discharge Disposition:    Per UR Regulation:  Reviewed for med. necessity/level of care/duration of stay  If discussed at Long Length of Stay Meetings, dates discussed:    Comments:

## 2012-08-07 LAB — CBC
MCHC: 32.7 g/dL (ref 30.0–36.0)
MCV: 92.5 fL (ref 78.0–100.0)
Platelets: 259 10*3/uL (ref 150–400)
RDW: 12.5 % (ref 11.5–15.5)
WBC: 10.9 10*3/uL — ABNORMAL HIGH (ref 4.0–10.5)

## 2012-08-07 MED ORDER — ACETAMINOPHEN 325 MG PO TABS
325.0000 mg | ORAL_TABLET | Freq: Four times a day (QID) | ORAL | Status: DC | PRN
  Administered 2012-08-07 – 2012-08-08 (×2): 650 mg via ORAL
  Filled 2012-08-07 (×2): qty 2

## 2012-08-07 NOTE — Progress Notes (Signed)
Patient ID: Nicole Campos, female   DOB: 04/05/1960, 52 y.o.   MRN: [id number]  Subjective: Still feels bloated, but better than yesterday.  No bm +flatus.  Tolerating regular diet.  States she is not having n/v with pain medication and is tolerating fine.  She is walking in the hallways.  Objective:  Vital signs:  Filed Vitals:   08/06/12 1427 08/06/12 1800 08/06/12 2146 08/07/12 0611  BP: 111/66 112/70 113/73 100/65  Pulse: 69 70 62 67  Temp: 97.8 F (36.6 C) 98.1 F (36.7 C) 97.8 F (36.6 C) 98.3 F (36.8 C)  TempSrc: Oral Oral Oral Oral  Resp: 16 18 18 18   Height:      Weight:      SpO2: 98% 99% 98% 98%    Last BM Date: 08/05/12  Intake/Output   Yesterday:  05/27 0701 - 05/28 0700 In: 3453.3 [P.O.:960; I.V.:1943.3; IV Piggyback:550] Out: 2370 [Urine:2300; Drains:70] This shift:     Bowel function:  Drain: RLQ JP drain, draining serosanguinous output 118ml--->70ml   Physical Exam: General: Pt awake/alert/oriented x3 in no acute distress  Chest: CTA No chest wall pain w good excursion.  IS 2500 CV: S1S2 RR no murmurs, gallops or rubs.  +2 distal pulses, no edema or cyanosis MS: Normal AROM mjr joints. No obvious deformity  Abdomen: Soft. Mildly distended, +bs x4 quadrants. Mildly tender at incisions only. No evidence of peritonitis. No incarcerated hernias.  Ext: SCDs BLE. No mjr edema. No cyanosis  Skin: No petechiae / purpura   Problem List:   Active Problems:   Acute appendicitis with peritoneal abscess   Results:   Labs: Results for orders placed during the hospital encounter of 08/05/12 (from the past 48 hour(s))  COMPREHENSIVE METABOLIC PANEL     Status: None   Collection Time    08/05/12  3:20 PM      Result Value Range   Sodium 139  135 - 145 mEq/L   Potassium 3.5  3.5 - 5.1 mEq/L   Chloride 103  96 - 112 mEq/L   CO2 23  19 - 32 mEq/L   Glucose, Bld 97  70 - 99 mg/dL   BUN 9  6 - 23 mg/dL   Creatinine, Ser 4.09  0.50 - 1.10 mg/dL   Calcium  9.7  8.4 - 81.1 mg/dL   Total Protein 8.0  6.0 - 8.3 g/dL   Albumin 4.2  3.5 - 5.2 g/dL   AST 13  0 - 37 U/L   ALT 13  0 - 35 U/L   Alkaline Phosphatase 78  39 - 117 U/L   Total Bilirubin 0.5  0.3 - 1.2 mg/dL   GFR calc non Af Amer >90  >90 mL/min   GFR calc Af Amer >90  >90 mL/min   Comment:            The eGFR has been calculated     using the CKD EPI equation.     This calculation has not been     validated in all clinical     situations.     eGFR's persistently     <90 mL/min signify     possible Chronic Kidney Disease.  CBC WITH DIFFERENTIAL     Status: Abnormal   Collection Time    08/05/12  3:20 PM      Result Value Range   WBC 13.3 (*) 4.0 - 10.5 K/uL   RBC 4.57  3.87 - 5.11 MIL/uL   Hemoglobin 14.6  12.0 - 15.0 g/dL   HCT 82.9  56.2 - 13.0 %   MCV 90.6  78.0 - 100.0 fL   MCH 31.9  26.0 - 34.0 pg   MCHC 35.3  30.0 - 36.0 g/dL   RDW 86.5  78.4 - 69.6 %   Platelets 323  150 - 400 K/uL   Neutrophils Relative % 79 (*) 43 - 77 %   Neutro Abs 10.5 (*) 1.7 - 7.7 K/uL   Lymphocytes Relative 13  12 - 46 %   Lymphs Abs 1.7  0.7 - 4.0 K/uL   Monocytes Relative 7  3 - 12 %   Monocytes Absolute 0.9  0.1 - 1.0 K/uL   Eosinophils Relative 1  0 - 5 %   Eosinophils Absolute 0.2  0.0 - 0.7 K/uL   Basophils Relative 0  0 - 1 %   Basophils Absolute 0.0  0.0 - 0.1 K/uL  LIPASE, BLOOD     Status: None   Collection Time    08/05/12  3:20 PM      Result Value Range   Lipase 32  11 - 59 U/L  URINALYSIS, ROUTINE W REFLEX MICROSCOPIC     Status: Abnormal   Collection Time    08/05/12  4:18 PM      Result Value Range   Color, Urine YELLOW  YELLOW   APPearance CLOUDY (*) CLEAR   Specific Gravity, Urine 1.016  1.005 - 1.030   pH 5.0  5.0 - 8.0   Glucose, UA NEGATIVE  NEGATIVE mg/dL   Hgb urine dipstick TRACE (*) NEGATIVE   Bilirubin Urine NEGATIVE  NEGATIVE   Ketones, ur NEGATIVE  NEGATIVE mg/dL   Protein, ur NEGATIVE  NEGATIVE mg/dL   Urobilinogen, UA 0.2  0.0 - 1.0 mg/dL    Nitrite NEGATIVE  NEGATIVE   Leukocytes, UA NEGATIVE  NEGATIVE  URINE MICROSCOPIC-ADD ON     Status: None   Collection Time    08/05/12  4:18 PM      Result Value Range   WBC, UA 0-2  <3 WBC/hpf  CBC     Status: Abnormal   Collection Time    08/06/12  4:40 AM      Result Value Range   WBC 11.4 (*) 4.0 - 10.5 K/uL   RBC 4.17  3.87 - 5.11 MIL/uL   Hemoglobin 13.2  12.0 - 15.0 g/dL   HCT 29.5  28.4 - 13.2 %   MCV 92.1  78.0 - 100.0 fL   MCH 31.7  26.0 - 34.0 pg   MCHC 34.4  30.0 - 36.0 g/dL   RDW 44.0  10.2 - 72.5 %   Platelets 276  150 - 400 K/uL  BASIC METABOLIC PANEL     Status: Abnormal   Collection Time    08/06/12  4:40 AM      Result Value Range   Sodium 138  135 - 145 mEq/L   Potassium 4.0  3.5 - 5.1 mEq/L   Chloride 103  96 - 112 mEq/L   CO2 27  19 - 32 mEq/L   Glucose, Bld 162 (*) 70 - 99 mg/dL   BUN 9  6 - 23 mg/dL   Creatinine, Ser 3.66  0.50 - 1.10 mg/dL   Calcium 9.1  8.4 - 44.0 mg/dL   GFR calc non Af Amer >90  >90 mL/min   GFR calc Af Amer >90  >90 mL/min   Comment:  The eGFR has been calculated     using the CKD EPI equation.     This calculation has not been     validated in all clinical     situations.     eGFR's persistently     <90 mL/min signify     possible Chronic Kidney Disease.  CBC     Status: Abnormal   Collection Time    08/07/12  4:15 AM      Result Value Range   WBC 10.9 (*) 4.0 - 10.5 K/uL   RBC 3.87  3.87 - 5.11 MIL/uL   Hemoglobin 11.7 (*) 12.0 - 15.0 g/dL   HCT 57.8 (*) 46.9 - 62.9 %   MCV 92.5  78.0 - 100.0 fL   MCH 30.2  26.0 - 34.0 pg   MCHC 32.7  30.0 - 36.0 g/dL   RDW 52.8  41.3 - 24.4 %   Platelets 259  150 - 400 K/uL    Medications / Allergies: per chart  Antibiotics: Anti-infectives   Start     Dose/Rate Route Frequency Ordered Stop   08/06/12 1400  clindamycin (CLEOCIN) IVPB 600 mg     600 mg 100 mL/hr over 30 Minutes Intravenous Every 6 hours 08/06/12 1326     08/06/12 0800  ciprofloxacin (CIPRO)  IVPB 400 mg     400 mg 200 mL/hr over 60 Minutes Intravenous Every 12 hours 08/05/12 2245     08/06/12 0600  ciprofloxacin (CIPRO) IVPB 400 mg     400 mg 200 mL/hr over 60 Minutes Intravenous On call to O.R. 08/05/12 1928 08/06/12 0451   08/06/12 0400  metroNIDAZOLE (FLAGYL) IVPB 500 mg  Status:  Discontinued     500 mg 100 mL/hr over 60 Minutes Intravenous Every 8 hours 08/05/12 2245 08/06/12 1326   08/05/12 1815  [MAR Hold]  metroNIDAZOLE (FLAGYL) IVPB 500 mg     (On MAR Hold since 08/05/12 1938)   500 mg 100 mL/hr over 60 Minutes Intravenous  Once 08/05/12 1804 08/05/12 2011      Assessment/Plan  Nicole Campos 52 y.o. female with acute appendicitis with peritoneal abscess  1 Day Post-Op Procedure(s):  APPENDECTOMY LAPAROSCOPIC  -Drain 122ml--->70ml/24h serosanguinous output.  WBC trending down, overall pt is improving.  -cipro, flagyl changed to clindamycin due to national shortage.   -DC IVF as pt tolerating regular diet. -Pain control  -IS  -plan for discharge tomorrow or Friday   VTE prophylaxis  -ambulate, SCDs, lovenox  Ashok Norris, [facility] Pager [phone] Office [phone]  08/07/2012 8:42 AM

## 2012-08-07 NOTE — Progress Notes (Signed)
Pt states that Percocet makes her 'sick to her stomach'.  No oral pain medicine given.  Told pt to tell MD in the am.  Will continue to monitor.  Nicole Campos

## 2012-08-07 NOTE — Progress Notes (Signed)
Spoke to [place], NP for [facility], informed her patient request tylenol, states percocet too strong and makes her sick feeling.

## 2012-08-07 NOTE — Progress Notes (Signed)
General Surgery [facility]  Patient seen and examined.  Making good progress - tolerating regular diet.  Serosanguinous in JP drain.  Anticipate discharge tomorrow.  Velora Heckler, MD, [facility] Office: [phone]

## 2012-08-08 MED ORDER — HYDROCODONE-ACETAMINOPHEN 5-325 MG PO TABS: 1.0000 | ORAL_TABLET | ORAL | Status: DC | PRN

## 2012-08-08 MED ORDER — ACETAMINOPHEN 325 MG PO TABS: 325.0000 mg | ORAL_TABLET | Freq: Four times a day (QID) | ORAL | Status: DC | PRN

## 2012-08-08 MED ORDER — ONDANSETRON HCL 4 MG PO TABS: 4.0000 mg | ORAL_TABLET | Freq: Four times a day (QID) | ORAL | Status: DC | PRN

## 2012-08-08 MED ORDER — CLINDAMYCIN HCL 300 MG PO CAPS: 300.0000 mg | ORAL_CAPSULE | Freq: Three times a day (TID) | ORAL | Status: DC

## 2012-08-08 MED ORDER — CIPROFLOXACIN HCL 500 MG PO TABS: 500.0000 mg | ORAL_TABLET | Freq: Two times a day (BID) | ORAL | Status: AC

## 2012-08-08 MED ORDER — HYDROCODONE-ACETAMINOPHEN 5-325 MG PO TABS
1.0000 | ORAL_TABLET | ORAL | Status: DC | PRN
  Administered 2012-08-08: 2 via ORAL
  Filled 2012-08-08: qty 2

## 2012-08-08 MED ORDER — CLINDAMYCIN HCL 300 MG PO CAPS: 300.0000 mg | ORAL_CAPSULE | Freq: Three times a day (TID) | ORAL | Status: AC

## 2012-08-08 MED ORDER — CIPROFLOXACIN HCL 500 MG PO TABS: 500.0000 mg | ORAL_TABLET | Freq: Two times a day (BID) | ORAL | Status: DC

## 2012-08-08 NOTE — Discharge Summary (Signed)
General Surgery [facility]  Patient seen and examined.  Drain removed.  Continue po abx for 5 more days.  Follow up at [facility] office as scheduled.  Velora Heckler, MD, [facility] Office: [phone]

## 2012-08-08 NOTE — Progress Notes (Signed)
3 Days Post-Op  Subjective: Pt feeling much better today.  Had a bm, tolerating a regular diet.  She complains of nausea with percocet would like to try vicodin instead.  Objective: Vital signs in last 24 hours: Temp:  [97.6 F (36.4 C)-98 F (36.7 C)] 97.6 F (36.4 C) (05/29 0557) Pulse Rate:  [63-69] 63 (05/29 0557) Resp:  [16-20] 16 (05/29 0557) BP: (113-133)/(73-83) 115/75 mmHg (05/29 0557) SpO2:  [97 %-99 %] 98 % (05/29 0557) Last BM Date: 08/04/12  Intake/Output from previous day: 05/28 0701 - 05/29 0700 In: 1860 [P.O.:1560; IV Piggyback:300] Out: 2745 [Urine:2700; Drains:45] Intake/Output this shift:    General appearance: alert, cooperative and no distress Cardio: regular rate and rhythm, S1, S2 normal, no murmur, click, rub or gallop GI: soft round and mildly tender at incision sites.  +BS x4 quadrants.  No masses or hernias.  Incisons are clean dry and intact.  RLQ JP drain with serosanginous output.  Lab Results:   Recent Labs  08/06/12 0440 08/07/12 0415  WBC 11.4* 10.9*  HGB 13.2 11.7*  HCT 38.4 35.8*  PLT 276 259   BMET  Recent Labs  08/05/12 1520 08/06/12 0440  NA 139 138  K 3.5 4.0  CL 103 103  CO2 23 27  GLUCOSE 97 162*  BUN 9 9  CREATININE 0.64 0.66  CALCIUM 9.7 9.1    Anti-infectives: Anti-infectives   Start     Dose/Rate Route Frequency Ordered Stop   08/08/12 0000  ciprofloxacin (CIPRO) 500 MG tablet     500 mg Oral 2 times daily 08/08/12 0824     08/08/12 0000  clindamycin (CLEOCIN) 300 MG capsule     300 mg Oral 3 times daily 08/08/12 0824     08/06/12 1400  clindamycin (CLEOCIN) IVPB 600 mg     600 mg 100 mL/hr over 30 Minutes Intravenous Every 6 hours 08/06/12 1326     08/06/12 0800  ciprofloxacin (CIPRO) IVPB 400 mg     400 mg 200 mL/hr over 60 Minutes Intravenous Every 12 hours 08/05/12 2245     08/06/12 0600  ciprofloxacin (CIPRO) IVPB 400 mg     400 mg 200 mL/hr over 60 Minutes Intravenous On call to O.R. 08/05/12 1928  08/06/12 0451   08/06/12 0400  metroNIDAZOLE (FLAGYL) IVPB 500 mg  Status:  Discontinued     500 mg 100 mL/hr over 60 Minutes Intravenous Every 8 hours 08/05/12 2245 08/06/12 1326   08/05/12 1815  [MAR Hold]  metroNIDAZOLE (FLAGYL) IVPB 500 mg     (On MAR Hold since 08/05/12 1938)   500 mg 100 mL/hr over 60 Minutes Intravenous  Once 08/05/12 1804 08/05/12 2011      Assessment/Plan: Nicole Campos 52 y.o. female with acute appendicitis with peritoneal abscess  3 Day Post-Op Procedure(s):  APPENDECTOMY LAPAROSCOPIC  -33ml--->45ml/24h of serosanguinous.  Will likely need to go home with drain, but Dr. Gerrit Friends to make the final decision. -give norco instead of percocet, but she will likely have the same symptoms.  She will take tylenol and motrin along with ice packs to help with pain.  I will also rx zofran prn for nausea -cipro and clinda, she has received 2 days thus far, rx for PO -discharge summary to follow     LOS: 3 days    Bonner Puna [facility] ANP-BC Pager [phone]  08/08/2012 8:33 AM

## 2012-08-08 NOTE — Discharge Summary (Signed)
Physician Discharge Summary  Patient ID: Nicole Campos MRN: [id number] DOB/AGE: 1960-06-06 52 y.o.  Admit date: 08/05/2012 Discharge date: 08/08/2012  Admitting Diagnosis: Acute Appendicitis  Discharge Diagnosis Patient Active Problem List   Diagnosis Date Noted  . Acute appendicitis with peritoneal abscess 08/05/2012    Consultants none  Imaging: CT abdomen and Pelvis:  1. Acute appendicitis. No evidence of periappendiceal abscess.  Large appendicolith at the base of the appendix.  2. Mildly enlarged right ovary without a discrete mass. Non-  emergent pelvic ultrasound after treatment of the acute  appendicitis may be helpful in further evaluation, if the ovary  cannot be adequately visualized at the time of surgery.  3. Cholelithiasis without CT evidence for acute cholecystitis.   Procedures Laparoscopic Appendectomy with drain placement  Hospital Course:  [name] is a 52 year old caucasian female with a history of hypertension  who presented to [facility] with RLQ pain.  Workup showed appendicitis without rupture.  Patient was admitted and underwent a laparoscopic appendectomy a 33F drain was left at abscess site, there was no evidence of perforation.  She tolerated the procedure well and was transferred to the floor.  Diet was advanced as tolerated.  The drain continued to drain serosanguinous output without evidence of purulent drainage.  On POD #3 the patient was voiding well, tolerating diet, ambulating well, pain well controlled, vital signs stable, incisions c/d/i and felt stable for discharge home.  The percocet was changed to Vicodin due to nausea which she seemed to tolerate well.  She was given a prescription for Zofran PRN for nausea.  She will try to use OTC analgesics and ice over the area to help with pain as well.  She was also advised to take her medication with food.  The drain was removed prior to discharge which she tolerated well.  There was no bleeding, a 4x4 and  Tegaderm were applied.  Self care measures were reviewed with the patient.  A follow up appointment has been made on her behalf and she is aware of the time and date.  I encouraged her to call our clinic with any questions or concerns.  Lastly, she was given a prescription for clindamycin and ciprofloxacin for 5 days giving her a total of 7 days of antiviotic therapy.      Medication List    TAKE these medications       acetaminophen 325 MG tablet  Commonly known as:  TYLENOL  Take 1-2 tablets (325-650 mg total) by mouth every 6 (six) hours as needed.     ciprofloxacin 500 MG tablet  Commonly known as:  CIPRO  Take 1 tablet (500 mg total) by mouth 2 (two) times daily.     clindamycin 300 MG capsule  Commonly known as:  CLEOCIN  Take 1 capsule (300 mg total) by mouth 3 (three) times daily.     EX-LAX 15 MG Chew  Generic drug:  Sennosides  Chew 30 mg by mouth once.     HYDROcodone-acetaminophen 5-325 MG per tablet  Commonly known as:  NORCO/VICODIN  Take 1-2 tablets by mouth every 4 (four) hours as needed.     metoprolol succinate 25 MG 24 hr tablet  Commonly known as:  TOPROL-XL  Take 25 mg by mouth every morning.     ondansetron 4 MG tablet  Commonly known as:  ZOFRAN  Take 1 tablet (4 mg total) by mouth every 6 (six) hours as needed for nausea.  Follow-up Information   Follow up with [facility] [place] On 08/20/2012. (your appointment is scheduled at 1pm.  Be sure to arrive prior to appointment time.)    Contact information:   [address]   [place] [address] [phone]       Signed: Ashok Norris, [facility] Surgery Pager [phone] Office [phone]  08/08/2012, 10:04 AM

## 2012-08-08 NOTE — Progress Notes (Signed)
General Surgery [facility]  Patient seen and examined.  Ready for discharge home today.  Will remove drain prior to discharge - thin serosanguinous fluid, no evidence of fistula.  Will try Vicodin for pain, and will give Rx for Phenergan if needed.  Will keep on po Augmentin for 5 more days.  Follow up in [facility] office 7-10 days.  Velora Heckler, MD, [facility] Office: [phone]

## 2012-08-08 NOTE — Progress Notes (Signed)
Discharge summary sent to payer through [facility]  

## 2012-08-08 NOTE — Progress Notes (Signed)
Pt stable, scripts, d/c instructions given with no questions/concerns voiced by pt.  Pt transported via wheelchair to private vehicle with NT and husband.

## 2012-08-20 ENCOUNTER — Encounter (INDEPENDENT_AMBULATORY_CARE_PROVIDER_SITE_OTHER): Payer: Self-pay | Admitting: General Surgery

## 2012-08-20 ENCOUNTER — Telehealth (INDEPENDENT_AMBULATORY_CARE_PROVIDER_SITE_OTHER): Payer: Self-pay | Admitting: General Surgery

## 2012-08-20 ENCOUNTER — Ambulatory Visit (INDEPENDENT_AMBULATORY_CARE_PROVIDER_SITE_OTHER): Payer: Managed Care, Other (non HMO) | Admitting: Internal Medicine

## 2012-08-20 ENCOUNTER — Encounter (INDEPENDENT_AMBULATORY_CARE_PROVIDER_SITE_OTHER): Payer: Self-pay

## 2012-08-20 VITALS — BP 130/82 | HR 83 | Temp 97.3°F | Resp 18 | Ht 72.0 in | Wt 252.4 lb

## 2012-08-20 DIAGNOSIS — B370 Candidal stomatitis: Secondary | ICD-10-CM

## 2012-08-20 DIAGNOSIS — B3731 Acute candidiasis of vulva and vagina: Secondary | ICD-10-CM

## 2012-08-20 DIAGNOSIS — K3533 Acute appendicitis with perforation and localized peritonitis, with abscess: Secondary | ICD-10-CM

## 2012-08-20 DIAGNOSIS — B373 Candidiasis of vulva and vagina: Secondary | ICD-10-CM

## 2012-08-20 NOTE — Telephone Encounter (Signed)
Pharmacy called about Magic Mouthwash Rx given today in [place] not being covered by her Insurance. I paged M.Dort PA-C and obtained a Verbal Order to change prescription to Nystatin Mouthwash. Called [facility] back, spoke with Roslynn Amble and verified order.

## 2012-08-20 NOTE — Patient Instructions (Signed)
May resume regular activity without restrictions. Follow up as needed. Call with questions or concerns.  

## 2012-08-20 NOTE — Progress Notes (Signed)
  Subjective: Pt returns to the clinic today after undergoing laparoscopic appendectomy on 08/05/12 by Dr. Luisa Hart.  The patient is tolerating their diet well and is having no severe pain.  Bowel function is good.  No problems with the wounds.  She is having some vaginal itching and burning in her mouth.  Objective: Vital signs in last 24 hours: Reviewed  PE: Abd: soft, non-tender, +bs, incisions well healed except suprapubic incision had hospital dressing and underneath the skin had separated.  No infection  Lab Results:  No results found for this basename: WBC, HGB, HCT, PLT,  in the last 72 hours BMET No results found for this basename: NA, K, CL, CO2, GLUCOSE, BUN, CREATININE, CALCIUM,  in the last 72 hours PT/INR No results found for this basename: LABPROT, INR,  in the last 72 hours CMP     Component Value Date/Time   NA 138 08/06/2012 0440   K 4.0 08/06/2012 0440   CL 103 08/06/2012 0440   CO2 27 08/06/2012 0440   GLUCOSE 162* 08/06/2012 0440   BUN 9 08/06/2012 0440   CREATININE 0.66 08/06/2012 0440   CALCIUM 9.1 08/06/2012 0440   PROT 8.0 08/05/2012 1520   ALBUMIN 4.2 08/05/2012 1520   AST 13 08/05/2012 1520   ALT 13 08/05/2012 1520   ALKPHOS 78 08/05/2012 1520   BILITOT 0.5 08/05/2012 1520   GFRNONAA >90 08/06/2012 0440   GFRAA >90 08/06/2012 0440   Lipase     Component Value Date/Time   LIPASE 32 08/05/2012 1520       Studies/Results: No results found.  Anti-infectives: Anti-infectives   None       Assessment/Plan  1.  S/P Laparoscopic Appendectomy: doing well, may resume regular activity without restrictions, Pt will follow up with [place] PRN and knows to call with questions or concerns.   2. Vaginal yeast infection: clinical symptoms, diflucan 150mg  times once  3. Thrush: clinical symptoms  Mouth wash   Campos, Nicole 08/20/2012

## 2012-11-18 ENCOUNTER — Other Ambulatory Visit: Payer: Self-pay | Admitting: Gastroenterology

## 2012-12-28 ENCOUNTER — Encounter (HOSPITAL_COMMUNITY): Payer: Self-pay | Admitting: Emergency Medicine

## 2012-12-28 ENCOUNTER — Observation Stay (HOSPITAL_COMMUNITY): Payer: Managed Care, Other (non HMO)

## 2012-12-28 ENCOUNTER — Emergency Department (HOSPITAL_COMMUNITY): Payer: Managed Care, Other (non HMO)

## 2012-12-28 ENCOUNTER — Inpatient Hospital Stay (HOSPITAL_COMMUNITY)
Admission: EM | Admit: 2012-12-28 | Discharge: 2012-12-31 | DRG: 392 | Disposition: A | Discharge status: Home / Self Care | Payer: Managed Care, Other (non HMO) | Attending: Surgery | Admitting: Surgery

## 2012-12-28 DIAGNOSIS — Z880 Allergy status to penicillin: Secondary | ICD-10-CM

## 2012-12-28 DIAGNOSIS — R109 Unspecified abdominal pain: Secondary | ICD-10-CM

## 2012-12-28 DIAGNOSIS — K5730 Diverticulosis of large intestine without perforation or abscess without bleeding: Secondary | ICD-10-CM | POA: Diagnosis present

## 2012-12-28 DIAGNOSIS — K8020 Calculus of gallbladder without cholecystitis without obstruction: Secondary | ICD-10-CM

## 2012-12-28 DIAGNOSIS — K5732 Diverticulitis of large intestine without perforation or abscess without bleeding: Principal | ICD-10-CM | POA: Diagnosis present

## 2012-12-28 DIAGNOSIS — I10 Essential (primary) hypertension: Secondary | ICD-10-CM | POA: Diagnosis present

## 2012-12-28 DIAGNOSIS — R1013 Epigastric pain: Secondary | ICD-10-CM | POA: Diagnosis present

## 2012-12-28 LAB — URINALYSIS, ROUTINE W REFLEX MICROSCOPIC
Glucose, UA: NEGATIVE mg/dL
Ketones, ur: NEGATIVE mg/dL
Leukocytes, UA: NEGATIVE
Nitrite: NEGATIVE
Protein, ur: NEGATIVE mg/dL
Urobilinogen, UA: 0.2 mg/dL (ref 0.0–1.0)

## 2012-12-28 LAB — COMPREHENSIVE METABOLIC PANEL
ALT: 14 U/L (ref 0–35)
Albumin: 4 g/dL (ref 3.5–5.2)
Calcium: 9.4 mg/dL (ref 8.4–10.5)
GFR calc Af Amer: >90 mL/min (ref >90)
Glucose, Bld: 103 mg/dL — ABNORMAL HIGH (ref 70–99)
Potassium: 3.9 mEq/L (ref 3.5–5.1)
Sodium: 138 mEq/L (ref 135–145)
Total Protein: 7.1 g/dL (ref 6.0–8.3)

## 2012-12-28 LAB — PREGNANCY, URINE: Preg Test, Ur: NEGATIVE

## 2012-12-28 LAB — CBC
Hemoglobin: 14.1 g/dL (ref 12.0–15.0)
MCH: 32.4 pg (ref 26.0–34.0)
MCHC: 35.4 g/dL (ref 30.0–36.0)
RDW: 12.4 % (ref 11.5–15.5)

## 2012-12-28 LAB — LIPASE, BLOOD: Lipase: 43 U/L (ref 11–59)

## 2012-12-28 MED ORDER — PANTOPRAZOLE SODIUM 40 MG IV SOLR
40.0000 mg | Freq: Every day | INTRAVENOUS | Status: DC
  Administered 2012-12-29 – 2012-12-30 (×2): 40 mg via INTRAVENOUS
  Filled 2012-12-28 (×3): qty 40

## 2012-12-28 MED ORDER — HYDROMORPHONE HCL PF 1 MG/ML IJ SOLN
1.0000 mg | Freq: Once | INTRAMUSCULAR | Status: AC
  Administered 2012-12-28: 1 mg via INTRAVENOUS
  Filled 2012-12-28: qty 1

## 2012-12-28 MED ORDER — SODIUM CHLORIDE 0.9 % IV BOLUS (SEPSIS)
1000.0000 mL | Freq: Once | INTRAVENOUS | Status: AC
  Administered 2012-12-28: 1000 mL via INTRAVENOUS

## 2012-12-28 MED ORDER — PANTOPRAZOLE SODIUM 40 MG IV SOLR
40.0000 mg | Freq: Once | INTRAVENOUS | Status: AC
  Administered 2012-12-28: 40 mg via INTRAVENOUS
  Filled 2012-12-28: qty 40

## 2012-12-28 MED ORDER — HYDROMORPHONE HCL PF 1 MG/ML IJ SOLN
1.0000 mg | INTRAMUSCULAR | Status: DC | PRN
  Administered 2012-12-28 – 2012-12-29 (×4): 1 mg via INTRAVENOUS
  Filled 2012-12-28 (×5): qty 1

## 2012-12-28 MED ORDER — ONDANSETRON HCL 4 MG/2ML IJ SOLN
4.0000 mg | Freq: Four times a day (QID) | INTRAMUSCULAR | Status: DC | PRN
  Administered 2012-12-28 – 2012-12-29 (×3): 4 mg via INTRAVENOUS
  Filled 2012-12-28 (×4): qty 2

## 2012-12-28 MED ORDER — ONDANSETRON HCL 4 MG/2ML IJ SOLN
4.0000 mg | Freq: Once | INTRAMUSCULAR | Status: AC
  Administered 2012-12-28: 4 mg via INTRAVENOUS
  Filled 2012-12-28: qty 2

## 2012-12-28 MED ORDER — LORAZEPAM 2 MG/ML IJ SOLN
1.0000 mg | Freq: Once | INTRAMUSCULAR | Status: AC
  Administered 2012-12-28: 1 mg via INTRAVENOUS
  Filled 2012-12-28: qty 1

## 2012-12-28 MED ORDER — ACETAMINOPHEN 325 MG PO TABS
650.0000 mg | ORAL_TABLET | Freq: Four times a day (QID) | ORAL | Status: DC | PRN
  Administered 2012-12-29 – 2012-12-30 (×2): 650 mg via ORAL
  Filled 2012-12-28 (×2): qty 2

## 2012-12-28 MED ORDER — HYDROCODONE-ACETAMINOPHEN 5-325 MG PO TABS
1.0000 | ORAL_TABLET | ORAL | Status: DC | PRN
  Administered 2012-12-28 (×2): 2 via ORAL
  Filled 2012-12-28 (×3): qty 2

## 2012-12-28 MED ORDER — KCL IN DEXTROSE-NACL 20-5-0.45 MEQ/L-%-% IV SOLN
INTRAVENOUS | Status: DC
  Administered 2012-12-28: 75 mL/h via INTRAVENOUS
  Administered 2012-12-29: 03:00:00 via INTRAVENOUS
  Administered 2012-12-29 – 2012-12-30 (×2): 75 mL/h via INTRAVENOUS
  Administered 2012-12-30: 06:00:00 via INTRAVENOUS
  Filled 2012-12-28 (×6): qty 1000

## 2012-12-28 MED ORDER — ACETAMINOPHEN 650 MG RE SUPP: 650.0000 mg | Freq: Four times a day (QID) | RECTAL | Status: DC | PRN

## 2012-12-28 NOTE — H&P (Signed)
Nicole Campos is an 52 y.o. female.    General Surgery [facility]  Chief Complaint: abdominal pain, nausea  HPI: the patient is a 52 year old female known to our surgical service from laparoscopic appendectomy in May 2014. Patient was in her normal state of good health until this morning. Following a normal bowel movement she developed upper abdominal pain. This persisted and was associated with nausea but no emesis. Patient has had no further bowel movements. She denies diarrhea or bleeding per rectum. Patient presented to the emergency department. Evaluation included an ultrasound of the which shows a solitary gallstone and sludge in the gallbladder. There are no acute inflammatory signs. Laboratory studies are essentially normal. Patient has had persistent pain and nausea despite repeated administration of narcotics and antiemetics.  The surgical service is asked to evaluate and manage.  Prior abdominal surgery including laparoscopic appendectomy in May 2014. No prior episodes of abdominal pain or episodes of biliary colic. Denies jaundice. Denies acholic stools.  Past Medical History  Diagnosis Date  . Hypertension   . PONV (postoperative nausea and vomiting)     Past Surgical History  Procedure Laterality Date  . Cesarean section    . Knee surgery      Left knee - meniscus tear  . Wisdom teeth    . Laparoscopic appendectomy N/A 08/05/2012    Procedure: APPENDECTOMY LAPAROSCOPIC;  Surgeon: Clovis Pu. Cornett, MD;  Location: [facility];  Service: General;  Laterality: N/A;    History reviewed. No pertinent family history. Social History:  reports that she has never smoked. She has never used smokeless tobacco. She reports that she drinks alcohol. She reports that she does not use illicit drugs.  Allergies:  Allergies  Allergen Reactions  . Penicillins Itching     (Not in a hospital admission)  Results for orders placed during the hospital encounter of  12/28/12 (from the past 48 hour(s))  CBC     Status: None   Collection Time    12/28/12  8:45 AM      Result Value Range   WBC 4.7  4.0 - 10.5 K/uL   RBC 4.35  3.87 - 5.11 MIL/uL   Hemoglobin 14.1  12.0 - 15.0 g/dL   HCT 16.1  09.6 - 04.5 %   MCV 91.5  78.0 - 100.0 fL   MCH 32.4  26.0 - 34.0 pg   MCHC 35.4  30.0 - 36.0 g/dL   RDW 40.9  81.1 - 91.4 %   Platelets 282  150 - 400 K/uL  COMPREHENSIVE METABOLIC PANEL     Status: Abnormal   Collection Time    12/28/12  8:45 AM      Result Value Range   Sodium 138  135 - 145 mEq/L   Potassium 3.9  3.5 - 5.1 mEq/L   Chloride 104  96 - 112 mEq/L   CO2 25  19 - 32 mEq/L   Glucose, Bld 103 (*) 70 - 99 mg/dL   BUN 19  6 - 23 mg/dL   Creatinine, Ser 7.82  0.50 - 1.10 mg/dL   Calcium 9.4  8.4 - 95.6 mg/dL   Total Protein 7.1  6.0 - 8.3 g/dL   Albumin 4.0  3.5 - 5.2 g/dL   AST 14  0 - 37 U/L   ALT 14  0 - 35 U/L   Alkaline Phosphatase 56  39 - 117 U/L   Total Bilirubin 0.4  0.3 - 1.2 mg/dL  GFR calc non Af Amer >90  >90 mL/min   GFR calc Af Amer >90  >90 mL/min   Comment: (NOTE)     The eGFR has been calculated using the CKD EPI equation.     This calculation has not been validated in all clinical situations.     eGFR's persistently <90 mL/min signify possible Chronic Kidney     Disease.  LIPASE, BLOOD     Status: None   Collection Time    12/28/12  8:45 AM      Result Value Range   Lipase 43  11 - 59 U/L  URINALYSIS, ROUTINE W REFLEX MICROSCOPIC     Status: None   Collection Time    12/28/12 10:35 AM      Result Value Range   Color, Urine YELLOW  YELLOW   APPearance CLEAR  CLEAR   Specific Gravity, Urine 1.024  1.005 - 1.030   pH 7.0  5.0 - 8.0   Glucose, UA NEGATIVE  NEGATIVE mg/dL   Hgb urine dipstick NEGATIVE  NEGATIVE   Bilirubin Urine NEGATIVE  NEGATIVE   Ketones, ur NEGATIVE  NEGATIVE mg/dL   Protein, ur NEGATIVE  NEGATIVE mg/dL   Urobilinogen, UA 0.2  0.0 - 1.0 mg/dL   Nitrite NEGATIVE  NEGATIVE   Leukocytes, UA  NEGATIVE  NEGATIVE   Comment: MICROSCOPIC NOT DONE ON URINES WITH NEGATIVE PROTEIN, BLOOD, LEUKOCYTES, NITRITE, OR GLUCOSE <1000 mg/dL.  PREGNANCY, URINE     Status: None   Collection Time    12/28/12 10:35 AM      Result Value Range   Preg Test, Ur NEGATIVE  NEGATIVE   Comment:            THE SENSITIVITY OF THIS     METHODOLOGY IS >20 mIU/mL.   US Abdomen Complete  12/28/2012   CLINICAL DATA:  Abdominal pain.  EXAM: ULTRASOUND ABDOMEN COMPLETE  COMPARISON:  CT, 08/05/2012  FINDINGS: Gallbladder  Single gallstone. A small amount of sludge. No wall thickening. No pericholecystic fluid. No evidence of acute cholecystitis.  Common bile duct  Diameter: Normal in caliber with no stones. Duct measures 4.2 mm.  Liver  No focal lesion identified. Within normal limits in parenchymal echogenicity.  IVC  No abnormality visualized.  Pancreas  Visualized portion unremarkable.  Spleen  Size and appearance within normal limits.  Right Kidney  Length: 12.6 cm. Echogenicity within normal limits. No mass or hydronephrosis visualized.  Left Kidney  Length: 12.8 cm. Echogenicity within normal limits. No mass or hydronephrosis visualized.  Abdominal aorta  No aneurysm visualized.  IMPRESSION: 1. Cholelithiasis without evidence of acute cholecystitis. 2. No other abnormalities.   Electronically Signed   By: Amie Portland M.D.   On: 12/28/2012 10:48    Review of Systems  Constitutional: Negative for fever, chills, weight loss, malaise/fatigue and diaphoresis.  HENT: Negative.   Eyes: Negative.   Respiratory: Negative.   Cardiovascular: Negative.   Gastrointestinal: Positive for nausea and abdominal pain. Negative for heartburn, vomiting, diarrhea, constipation, blood in stool and melena.  Genitourinary: Negative.   Musculoskeletal: Negative.   Skin: Negative.   Neurological: Positive for weakness.  Endo/Heme/Allergies: Negative.   Psychiatric/Behavioral: Negative.     Blood pressure 112/75, pulse 80,  temperature 98.1 F (36.7 C), temperature source Oral, resp. rate 16, SpO2 98.00%. Physical Exam  Constitutional: She is oriented to person, place, and time. She appears well-developed and well-nourished. No distress.  HENT:  Head: Normocephalic and atraumatic.  Right Ear:  External ear normal.  Left Ear: External ear normal.  Mouth/Throat: No oropharyngeal exudate.  Eyes: Conjunctivae are normal. Pupils are equal, round, and reactive to light. No scleral icterus.  Neck: Normal range of motion. Neck supple. No thyromegaly present.  Cardiovascular: Normal rate, regular rhythm and normal heart sounds.   No murmur heard. Respiratory: Effort normal and breath sounds normal. She has no wheezes.  GI: Soft. She exhibits no distension and no mass. There is tenderness (peri-umbilical tenderness; no RUQ tenderness). There is no rebound and no guarding.  Musculoskeletal: Normal range of motion. She exhibits no edema.  Lymphadenopathy:    She has no cervical adenopathy.  Neurological: She is oriented to person, place, and time.  Skin: Skin is warm and dry.  Psychiatric: She has a normal mood and affect. Her behavior is normal.     Assessment/Plan #1 abdominal pain #2 cholelithiasis without evidence of cholecystitis #3 history of laparoscopic appendectomy  Patient will be admitted to the general surgical service for evaluation. We will treat her pain and nausea. She will receive intravenous fluids. She wishes to try clear liquids. I will obtain a 2 view abdominal x-ray. We will repeat her laboratory studies on the morning of 12/29/2012.  If symptoms persist and are consistent with biliary colic, she may require cholecystectomy during this admission. Otherwise if symptoms improve, I think she can have further evaluation as an outpatient.  Patient be admitted to the surgical service for observation.  Velora Heckler, MD, [facility] Office: [phone]    Aino Heckert  M 12/28/2012, 12:52 PM

## 2012-12-28 NOTE — ED Notes (Signed)
She c/o very sudden onset of severe, central abd. Pain which began 1 hour p.t.a.

## 2012-12-28 NOTE — ED Provider Notes (Signed)
CSN: [id number]     Arrival date & time 12/28/12  4098 History   First MD Initiated Contact with Patient 12/28/12 [phone]     Chief Complaint  Patient presents with  . Abdominal Pain   (Consider location/radiation/quality/duration/timing/severity/associated sxs/prior Treatment) Patient is a 52 y.o. female presenting with abdominal pain. The history is provided by the patient and the spouse.  Abdominal Pain Associated symptoms: nausea   Associated symptoms: no chest pain, no chills, no fever and no shortness of breath   pt c/o mid to upper abd pain onset this am, approximately 1-2 hours ago. Had normal bm this morning. Then onset abd pain and cramping. Dull, constant, non radiating, without specific exacerbating or alleviating factors. Nausea. No vomiting. No diarrhea. ?mild constipation in past week, but was able to have bms, including today. Denies hx same pain. No hx chronic bowel disease or symptoms. Prior abd surgery including remote hx csection and appendectomy 5/14. No hx pud or gallstones. No hx pancreatitis. Denies abd distension. No gu c/o. No fever or chills. No back or flank pain. No cp or sob. No cough or uri c/o.     Past Medical History  Diagnosis Date  . Hypertension   . PONV (postoperative nausea and vomiting)    Past Surgical History  Procedure Laterality Date  . Cesarean section    . Knee surgery      Left knee - meniscus tear  . Wisdom teeth    . Laparoscopic appendectomy N/A 08/05/2012    Procedure: APPENDECTOMY LAPAROSCOPIC;  Surgeon: Clovis Pu. Cornett, MD;  Location: [facility];  Service: General;  Laterality: N/A;   History reviewed. No pertinent family history. History  Substance Use Topics  . Smoking status: Never Smoker   . Smokeless tobacco: Never Used  . Alcohol Use: Yes     Comment: occasional   OB History   Grav Para Term Preterm Abortions TAB SAB Ect Mult Living                 Review of Systems  Constitutional: Negative for fever and chills.   Eyes: Negative for redness.  Respiratory: Negative for shortness of breath.   Cardiovascular: Negative for chest pain.  Gastrointestinal: Positive for nausea and abdominal pain.  Genitourinary: Negative for flank pain.  Musculoskeletal: Negative for back pain and neck pain.  Skin: Negative for rash.  Neurological: Negative for headaches.  Hematological: Does not bruise/bleed easily.  Psychiatric/Behavioral: Negative for confusion.    Allergies  Penicillins  Home Medications   Current Outpatient Rx  Name  Route  Sig  Dispense  Refill  . metoprolol succinate (TOPROL-XL) 25 MG 24 hr tablet   Oral   Take 25 mg by mouth every morning.          There were no vitals taken for this visit. Physical Exam  Nursing note and vitals reviewed. Constitutional: She appears well-developed and well-nourished. No distress.  HENT:  Mouth/Throat: Oropharynx is clear and moist.  Eyes: Conjunctivae are normal. No scleral icterus.  Neck: Neck supple. No tracheal deviation present.  Cardiovascular: Normal rate, regular rhythm, normal heart sounds and intact distal pulses.   Pulmonary/Chest: Effort normal and breath sounds normal. No respiratory distress.  Abdominal: Soft. Normal appearance and bowel sounds are normal. She exhibits no distension and no mass. There is tenderness. There is no rebound and no guarding.  Mid to upper abd tenderness.  Genitourinary:  No cva tenderness  Musculoskeletal: She exhibits no edema.  Neurological: She is  alert.  Skin: Skin is warm and dry. No rash noted.  Psychiatric: She has a normal mood and affect.    ED Course  Procedures (including critical care time)  Results for orders placed during the hospital encounter of 12/28/12  URINALYSIS, ROUTINE W REFLEX MICROSCOPIC      Result Value Range   Color, Urine YELLOW  YELLOW   APPearance CLEAR  CLEAR   Specific Gravity, Urine 1.024  1.005 - 1.030   pH 7.0  5.0 - 8.0   Glucose, UA NEGATIVE  NEGATIVE mg/dL    Hgb urine dipstick NEGATIVE  NEGATIVE   Bilirubin Urine NEGATIVE  NEGATIVE   Ketones, ur NEGATIVE  NEGATIVE mg/dL   Protein, ur NEGATIVE  NEGATIVE mg/dL   Urobilinogen, UA 0.2  0.0 - 1.0 mg/dL   Nitrite NEGATIVE  NEGATIVE   Leukocytes, UA NEGATIVE  NEGATIVE  PREGNANCY, URINE      Result Value Range   Preg Test, Ur NEGATIVE  NEGATIVE  CBC      Result Value Range   WBC 4.7  4.0 - 10.5 K/uL   RBC 4.35  3.87 - 5.11 MIL/uL   Hemoglobin 14.1  12.0 - 15.0 g/dL   HCT 21.3  08.6 - 57.8 %   MCV 91.5  78.0 - 100.0 fL   MCH 32.4  26.0 - 34.0 pg   MCHC 35.4  30.0 - 36.0 g/dL   RDW 46.9  62.9 - 52.8 %   Platelets 282  150 - 400 K/uL  COMPREHENSIVE METABOLIC PANEL      Result Value Range   Sodium 138  135 - 145 mEq/L   Potassium 3.9  3.5 - 5.1 mEq/L   Chloride 104  96 - 112 mEq/L   CO2 25  19 - 32 mEq/L   Glucose, Bld 103 (*) 70 - 99 mg/dL   BUN 19  6 - 23 mg/dL   Creatinine, Ser 4.13  0.50 - 1.10 mg/dL   Calcium 9.4  8.4 - 24.4 mg/dL   Total Protein 7.1  6.0 - 8.3 g/dL   Albumin 4.0  3.5 - 5.2 g/dL   AST 14  0 - 37 U/L   ALT 14  0 - 35 U/L   Alkaline Phosphatase 56  39 - 117 U/L   Total Bilirubin 0.4  0.3 - 1.2 mg/dL   GFR calc non Af Amer >90  >90 mL/min   GFR calc Af Amer >90  >90 mL/min  LIPASE, BLOOD      Result Value Range   Lipase 43  11 - 59 U/L   US Abdomen Complete  12/28/2012   CLINICAL DATA:  Abdominal pain.  EXAM: ULTRASOUND ABDOMEN COMPLETE  COMPARISON:  CT, 08/05/2012  FINDINGS: Gallbladder  Single gallstone. A small amount of sludge. No wall thickening. No pericholecystic fluid. No evidence of acute cholecystitis.  Common bile duct  Diameter: Normal in caliber with no stones. Duct measures 4.2 mm.  Liver  No focal lesion identified. Within normal limits in parenchymal echogenicity.  IVC  No abnormality visualized.  Pancreas  Visualized portion unremarkable.  Spleen  Size and appearance within normal limits.  Right Kidney  Length: 12.6 cm. Echogenicity within normal  limits. No mass or hydronephrosis visualized.  Left Kidney  Length: 12.8 cm. Echogenicity within normal limits. No mass or hydronephrosis visualized.  Abdominal aorta  No aneurysm visualized.  IMPRESSION: 1. Cholelithiasis without evidence of acute cholecystitis. 2. No other abnormalities.   Electronically Signed   By:  Amie Portland M.D.   On: 12/28/2012 10:48     EKG Interpretation   None       MDM  Iv ns bolus. zofran iv .dilaudid 1 mg iv. protonix iv. Labs. Xr.  Reviewed nursing notes and prior charts for additional history.   Pt w persistent pain, also anxious w the pain. Dilaudid iv. zofran iv.   Recheck, pain returns. Recheck abd exam, soft nt.   Given gallstone, persistent pain despite iv meds, will discuss w gen surgery.   Surgery to see in ed/admit.   Suzi Roots, MD 12/30/12 [phone]

## 2012-12-28 NOTE — ED Notes (Signed)
Pt states abd pain increasing. Will notify MD

## 2012-12-28 NOTE — ED Notes (Signed)
US at bedside

## 2012-12-28 NOTE — ED Notes (Signed)
Pt ambulated to restroom. States pain increased with ambulation

## 2012-12-28 NOTE — ED Notes (Signed)
Pt reports she felt fine this morning, had regular BM, then developed severe onset of mid abd pain/cramping. Thought she may have diarrhea, tried to move bowels again, pain became worse. Hx of appendectomy 5/26

## 2012-12-29 ENCOUNTER — Observation Stay (HOSPITAL_COMMUNITY): Payer: Managed Care, Other (non HMO)

## 2012-12-29 DIAGNOSIS — R1013 Epigastric pain: Secondary | ICD-10-CM | POA: Diagnosis present

## 2012-12-29 LAB — CBC WITH DIFFERENTIAL/PLATELET
Basophils Absolute: 0 10*3/uL (ref 0.0–0.1)
Basophils Relative: 0 % (ref 0–1)
Eosinophils Absolute: 0.2 10*3/uL (ref 0.0–0.7)
Hemoglobin: 12.6 g/dL (ref 12.0–15.0)
MCH: 31.9 pg (ref 26.0–34.0)
MCHC: 34.6 g/dL (ref 30.0–36.0)
Monocytes Absolute: 0.5 10*3/uL (ref 0.1–1.0)
Monocytes Relative: 6 % (ref 3–12)
Neutrophils Relative %: 70 % (ref 43–77)
RDW: 12.6 % (ref 11.5–15.5)

## 2012-12-29 LAB — AMYLASE: Amylase: 34 U/L (ref 0–105)

## 2012-12-29 LAB — COMPREHENSIVE METABOLIC PANEL
Albumin: 3.6 g/dL (ref 3.5–5.2)
BUN: 15 mg/dL (ref 6–23)
Chloride: 103 mEq/L (ref 96–112)
Creatinine, Ser: 0.64 mg/dL (ref 0.50–1.10)
GFR calc non Af Amer: >90 mL/min (ref >90)
Potassium: 3.5 mEq/L (ref 3.5–5.1)
Sodium: 136 mEq/L (ref 135–145)
Total Bilirubin: 0.4 mg/dL (ref 0.3–1.2)
Total Protein: 6.4 g/dL (ref 6.0–8.3)

## 2012-12-29 LAB — LIPASE, BLOOD: Lipase: 24 U/L (ref 11–59)

## 2012-12-29 MED ORDER — HYDROMORPHONE HCL PF 1 MG/ML IJ SOLN
1.0000 mg | INTRAMUSCULAR | Status: DC | PRN
  Administered 2012-12-29: 1 mg via INTRAVENOUS
  Filled 2012-12-29: qty 1

## 2012-12-29 MED ORDER — IOHEXOL 300 MG/ML  SOLN
100.0000 mL | Freq: Once | INTRAMUSCULAR | Status: AC | PRN
  Administered 2012-12-29: 100 mL via INTRAVENOUS

## 2012-12-29 MED ORDER — IOHEXOL 300 MG/ML  SOLN
50.0000 mL | Freq: Once | INTRAMUSCULAR | Status: AC | PRN
  Administered 2012-12-29: 50 mL via ORAL

## 2012-12-29 MED ORDER — PROMETHAZINE HCL 25 MG/ML IJ SOLN
12.5000 mg | Freq: Four times a day (QID) | INTRAMUSCULAR | Status: DC | PRN
  Administered 2012-12-29: 12.5 mg via INTRAVENOUS
  Filled 2012-12-29: qty 1

## 2012-12-29 MED ORDER — PIPERACILLIN-TAZOBACTAM 3.375 G IVPB
3.3750 g | Freq: Three times a day (TID) | INTRAVENOUS | Status: DC
  Administered 2012-12-29 – 2012-12-31 (×6): 3.375 g via INTRAVENOUS
  Filled 2012-12-29 (×7): qty 50

## 2012-12-29 NOTE — Progress Notes (Addendum)
In to give patient medication, patient up to restroom and vomited 400 cc pink fluid.  Patient instructed to not take anything else by mouth at this time and given IV Zofran.  Pain after vomiting decreased at this time. Rating pain from 7 to a 5. Describes pain when 7 like a knife stabbing in her epigastric area.  States it comes in waves of pain.  Dr Gerrit Friends paged to discuss patient current pain medication that is ordered. Medications modified and labs ordered.

## 2012-12-29 NOTE — Progress Notes (Addendum)
Patient ID: Nicole Campos, female   DOB: 21-Jan-1961, 52 y.o.   MRN: [id number] [facility] Surgery Progress Note:   * No surgery found *  Subjective: Mental status is clear; first pain relief after a bad night.   Objective: Vital signs in last 24 hours: Temp:  [97.4 F (36.3 C)-98.2 F (36.8 C)] 97.6 F (36.4 C) (10/19 0550) Pulse Rate:  [53-80] 53 (10/19 0550) Resp:  [14-18] 16 (10/19 0550) BP: (112-149)/(55-92) 117/72 mmHg (10/19 0550) SpO2:  [91 %-100 %] 97 % (10/19 0550) Weight:  [240 lb (108.863 kg)] 240 lb (108.863 kg) (10/18 1437)  Intake/Output from previous day: 10/18 0701 - 10/19 0700 In: 1240 [P.O.:240; I.V.:1000] Out: 1250 [Urine:850; Emesis/NG output:400] Intake/Output this shift:    Physical Exam: Work of breathing is normal.  No rebound or guarding.  Pain described a sharp and not radiating into the back.  Lab Results:  Results for orders placed during the hospital encounter of 12/28/12 (from the past 48 hour(s))  CBC     Status: None   Collection Time    12/28/12  8:45 AM      Result Value Range   WBC 4.7  4.0 - 10.5 K/uL   RBC 4.35  3.87 - 5.11 MIL/uL   Hemoglobin 14.1  12.0 - 15.0 g/dL   HCT 40.9  81.1 - 91.4 %   MCV 91.5  78.0 - 100.0 fL   MCH 32.4  26.0 - 34.0 pg   MCHC 35.4  30.0 - 36.0 g/dL   RDW 78.2  95.6 - 21.3 %   Platelets 282  150 - 400 K/uL  COMPREHENSIVE METABOLIC PANEL     Status: Abnormal   Collection Time    12/28/12  8:45 AM      Result Value Range   Sodium 138  135 - 145 mEq/L   Potassium 3.9  3.5 - 5.1 mEq/L   Chloride 104  96 - 112 mEq/L   CO2 25  19 - 32 mEq/L   Glucose, Bld 103 (*) 70 - 99 mg/dL   BUN 19  6 - 23 mg/dL   Creatinine, Ser 0.86  0.50 - 1.10 mg/dL   Calcium 9.4  8.4 - 57.8 mg/dL   Total Protein 7.1  6.0 - 8.3 g/dL   Albumin 4.0  3.5 - 5.2 g/dL   AST 14  0 - 37 U/L   ALT 14  0 - 35 U/L   Alkaline Phosphatase 56  39 - 117 U/L   Total Bilirubin 0.4  0.3 - 1.2 mg/dL   GFR calc non Af Amer >90  >90 mL/min    GFR calc Af Amer >90  >90 mL/min   Comment: (NOTE)     The eGFR has been calculated using the CKD EPI equation.     This calculation has not been validated in all clinical situations.     eGFR's persistently <90 mL/min signify possible Chronic Kidney     Disease.  LIPASE, BLOOD     Status: None   Collection Time    12/28/12  8:45 AM      Result Value Range   Lipase 43  11 - 59 U/L  URINALYSIS, ROUTINE W REFLEX MICROSCOPIC     Status: None   Collection Time    12/28/12 10:35 AM      Result Value Range   Color, Urine YELLOW  YELLOW   APPearance CLEAR  CLEAR   Specific Gravity, Urine 1.024  1.005 -  1.030   pH 7.0  5.0 - 8.0   Glucose, UA NEGATIVE  NEGATIVE mg/dL   Hgb urine dipstick NEGATIVE  NEGATIVE   Bilirubin Urine NEGATIVE  NEGATIVE   Ketones, ur NEGATIVE  NEGATIVE mg/dL   Protein, ur NEGATIVE  NEGATIVE mg/dL   Urobilinogen, UA 0.2  0.0 - 1.0 mg/dL   Nitrite NEGATIVE  NEGATIVE   Leukocytes, UA NEGATIVE  NEGATIVE   Comment: MICROSCOPIC NOT DONE ON URINES WITH NEGATIVE PROTEIN, BLOOD, LEUKOCYTES, NITRITE, OR GLUCOSE <1000 mg/dL.  PREGNANCY, URINE     Status: None   Collection Time    12/28/12 10:35 AM      Result Value Range   Preg Test, Ur NEGATIVE  NEGATIVE   Comment:            THE SENSITIVITY OF THIS     METHODOLOGY IS >20 mIU/mL.  CBC WITH DIFFERENTIAL     Status: None   Collection Time    12/29/12  3:56 AM      Result Value Range   WBC 7.6  4.0 - 10.5 K/uL   RBC 3.95  3.87 - 5.11 MIL/uL   Hemoglobin 12.6  12.0 - 15.0 g/dL   HCT 91.4  78.2 - 95.6 %   MCV 92.2  78.0 - 100.0 fL   MCH 31.9  26.0 - 34.0 pg   MCHC 34.6  30.0 - 36.0 g/dL   RDW 21.3  08.6 - 57.8 %   Platelets 251  150 - 400 K/uL   Neutrophils Relative % 70  43 - 77 %   Neutro Abs 5.4  1.7 - 7.7 K/uL   Lymphocytes Relative 21  12 - 46 %   Lymphs Abs 1.6  0.7 - 4.0 K/uL   Monocytes Relative 6  3 - 12 %   Monocytes Absolute 0.5  0.1 - 1.0 K/uL   Eosinophils Relative 2  0 - 5 %   Eosinophils Absolute  0.2  0.0 - 0.7 K/uL   Basophils Relative 0  0 - 1 %   Basophils Absolute 0.0  0.0 - 0.1 K/uL  COMPREHENSIVE METABOLIC PANEL     Status: Abnormal   Collection Time    12/29/12  3:56 AM      Result Value Range   Sodium 136  135 - 145 mEq/L   Potassium 3.5  3.5 - 5.1 mEq/L   Chloride 103  96 - 112 mEq/L   CO2 25  19 - 32 mEq/L   Glucose, Bld 125 (*) 70 - 99 mg/dL   BUN 15  6 - 23 mg/dL   Creatinine, Ser 4.69  0.50 - 1.10 mg/dL   Calcium 9.3  8.4 - 62.9 mg/dL   Total Protein 6.4  6.0 - 8.3 g/dL   Albumin 3.6  3.5 - 5.2 g/dL   AST 11  0 - 37 U/L   ALT 12  0 - 35 U/L   Alkaline Phosphatase 48  39 - 117 U/L   Total Bilirubin 0.4  0.3 - 1.2 mg/dL   GFR calc non Af Amer >90  >90 mL/min   GFR calc Af Amer >90  >90 mL/min   Comment: (NOTE)     The eGFR has been calculated using the CKD EPI equation.     This calculation has not been validated in all clinical situations.     eGFR's persistently <90 mL/min signify possible Chronic Kidney     Disease.  AMYLASE     Status: None  Collection Time    12/29/12  3:56 AM      Result Value Range   Amylase 34  0 - 105 U/L  LIPASE, BLOOD     Status: None   Collection Time    12/29/12  3:56 AM      Result Value Range   Lipase 24  11 - 59 U/L    Radiology/Results: US Abdomen Complete  12/28/2012   CLINICAL DATA:  Abdominal pain.  EXAM: ULTRASOUND ABDOMEN COMPLETE  COMPARISON:  CT, 08/05/2012  FINDINGS: Gallbladder  Single gallstone. A small amount of sludge. No wall thickening. No pericholecystic fluid. No evidence of acute cholecystitis.  Common bile duct  Diameter: Normal in caliber with no stones. Duct measures 4.2 mm.  Liver  No focal lesion identified. Within normal limits in parenchymal echogenicity.  IVC  No abnormality visualized.  Pancreas  Visualized portion unremarkable.  Spleen  Size and appearance within normal limits.  Right Kidney  Length: 12.6 cm. Echogenicity within normal limits. No mass or hydronephrosis visualized.  Left Kidney   Length: 12.8 cm. Echogenicity within normal limits. No mass or hydronephrosis visualized.  Abdominal aorta  No aneurysm visualized.  IMPRESSION: 1. Cholelithiasis without evidence of acute cholecystitis. 2. No other abnormalities.   Electronically Signed   By: Amie Portland M.D.   On: 12/28/2012 10:48   Dg Abd 2 Views  12/28/2012   CLINICAL DATA:  Mid abdominal pain and nausea.  EXAM: ABDOMEN - 2 VIEW  COMPARISON:  CT scan thigh 11/05/2012.  FINDINGS: Of the bowel gas pattern is unremarkable. No findings for obstruction or perforation. The soft tissue shadows are grossly maintained. The lung bases are clear. The bony structures are intact.  IMPRESSION: Unremarkable abdominal radiographs.   Electronically Signed   By: Loralie Champagne M.D.   On: 12/28/2012 13:42    Anti-infectives: Anti-infectives   None      Assessment/Plan: Problem List: Patient Active Problem List   Diagnosis Date Noted  . Acute appendicitis with peritoneal abscess 08/05/2012    Will get CT abdomen this am.  Pain does not sound like biliary pain.  * No surgery found *    LOS: 1 day   Matt B. Daphine Deutscher, MD, [facility] [phone] beeper [phone]  12/29/2012 8:16 AM CT scan reviewed.  Patient reports that the pain began after straining with a BM.  Felt like sharp knives in midabdomen.  CT shows some free fluid near the splenic flexure.  ? Diverticulitis?  Will treat with Zosyn (not penicillin allergic as she developed thrush after taking it long ago.).  If no better tomorrow will get Dr. Dulce Sellar (her GI) to see.

## 2012-12-30 MED ORDER — HEPARIN SODIUM (PORCINE) 5000 UNIT/ML IJ SOLN
5000.0000 [IU] | Freq: Three times a day (TID) | INTRAMUSCULAR | Status: DC
  Administered 2012-12-30 – 2012-12-31 (×3): 5000 [IU] via SUBCUTANEOUS
  Filled 2012-12-30 (×6): qty 1

## 2012-12-30 NOTE — Progress Notes (Signed)
General surgery attending note:  I have interviewed and examined this patient this morning. I agree with the assessment and treatment plan outlined by Mr. Marlyne Beards, [place].  She is passing flatus but has had no stool. Nausea has resolved.The patient's abdominal exam is essentially benign. I explained to her that we are theorizing  that this may have been an episode of diverticulitis, considering the fact that we know she has diverticula and the historical onset of the pain is not inconsistent with a small microperforation. Rupture of her right ovarian cyst seems less likely, since she is postmenopausal.  If she continues to do well, consider discharge on Cipro and Flagyl for a total of 14 days and followup with her gastroenterologist.  She does have gallstones, but this does not present like an acute cholecystitis, and I do not think she should be treated as such at this point in time. She is aware that she has gallstones.  Angelia Mould. Derrell Lolling, M.D., [facility] General and Minimally invasive Surgery Breast and Colorectal Surgery Office:   [phone] Pager:   [phone]

## 2012-12-30 NOTE — Progress Notes (Signed)
Subjective: Pt on clears, but not really interested in them.  She is not having pain, better since Sunday.  No nausea, no vomiting;   no BM since admission.  Objective: Vital signs in last 24 hours: Temp:  [97.3 F (36.3 C)-98.6 F (37 C)] 97.8 F (36.6 C) (10/20 0510) Pulse Rate:  [56-70] 56 (10/20 0510) Resp:  [16-18] 18 (10/20 0510) BP: (118-134)/(64-73) 122/71 mmHg (10/20 0510) SpO2:  [96 %-100 %] 96 % (10/20 0510) Last BM Date: 12/28/12 Diet: clears I/O nothing recorded PO Afebrile, VSS Lab OK yesterday Intake/Output from previous day: 10/19 0701 - 10/20 0700 In: 2046.3 [I.V.:1896.3; IV Piggyback:150] Out: 2300 [Urine:2300] Intake/Output this shift:    General appearance: alert, cooperative and no distress GI: soft, non-tender; bowel sounds normal; no masses,  no organomegaly  Lab Results:   Recent Labs  12/28/12 0845 12/29/12 0356  WBC 4.7 7.6  HGB 14.1 12.6  HCT 39.8 36.4  PLT 282 251    BMET  Recent Labs  12/28/12 0845 12/29/12 0356  NA 138 136  K 3.9 3.5  CL 104 103  CO2 25 25  GLUCOSE 103* 125*  BUN 19 15  CREATININE 0.68 0.64  CALCIUM 9.4 9.3   PT/INR No results found for this basename: LABPROT, INR,  in the last 72 hours   Recent Labs Lab 12/28/12 0845 12/29/12 0356  AST 14 11  ALT 14 12  ALKPHOS 56 48  BILITOT 0.4 0.4  PROT 7.1 6.4  ALBUMIN 4.0 3.6     Lipase     Component Value Date/Time   LIPASE 24 12/29/2012 0356     Studies/Results: US Abdomen Complete  12/28/2012   CLINICAL DATA:  Abdominal pain.  EXAM: ULTRASOUND ABDOMEN COMPLETE  COMPARISON:  CT, 08/05/2012  FINDINGS: Gallbladder  Single gallstone. A small amount of sludge. No wall thickening. No pericholecystic fluid. No evidence of acute cholecystitis.  Common bile duct  Diameter: Normal in caliber with no stones. Duct measures 4.2 mm.  Liver  No focal lesion identified. Within normal limits in parenchymal echogenicity.  IVC  No abnormality visualized.  Pancreas   Visualized portion unremarkable.  Spleen  Size and appearance within normal limits.  Right Kidney  Length: 12.6 cm. Echogenicity within normal limits. No mass or hydronephrosis visualized.  Left Kidney  Length: 12.8 cm. Echogenicity within normal limits. No mass or hydronephrosis visualized.  Abdominal aorta  No aneurysm visualized.  IMPRESSION: 1. Cholelithiasis without evidence of acute cholecystitis. 2. No other abnormalities.   Electronically Signed   By: Amie Portland M.D.   On: 12/28/2012 10:48   Ct Abdomen Pelvis W Contrast  12/29/2012   CLINICAL DATA:  Sharp epigastric pain, nausea and vomiting  EXAM: CT ABDOMEN AND PELVIS WITH CONTRAST  TECHNIQUE: Multidetector CT imaging of the abdomen and pelvis was performed using the standard protocol following bolus administration of intravenous contrast.  CONTRAST:  50mL OMNIPAQUE IOHEXOL 300 MG/ML SOLN, OMNIPAQUE IOHEXOL 300 MG/ML SOLN  COMPARISON:  Prior CT abdomen/ pelvis 08/05/2012  FINDINGS: Lower Chest: Stable 9 mm pulmonary nodule versus nodular pleural parenchymal scarring in the inferior lingula. Scarring is favored. Dependent atelectasis is noted in the lower lobes. The visualized cardiac structures are within normal limits for size. No pericardial effusion. Unremarkable visualized distal thoracic esophagus.  Abdomen: Unremarkable CT appearance of the stomach, duodenum, spleen, adrenal glands and pancreas. Normal hepatic contours and morphology. No focal lesion. Cholelithiasis again and noted without gallbladder wall thickening or pericholecystic fluid.  Unremarkable  appearance of the bilateral kidneys. No focal solid lesion, hydronephrosis or nephrolithiasis.  Colonic diverticular disease without CT evidence of active inflammation. There is a small amount of low attenuation free fluid in the left upper quadrant in the upper pericolic gutter adjacent to the anterior inferior tip of the spleen, the splenic flexure of the colon and of the pancreatic  tail. No definite focal wall thickening or abnormality in the adjacent colon, no abnormality in the adjacent spleen, and the pancreatic tail also appears unremarkable with a preserved but thin fat plane between the tail and the fluid. Surgical changes of cholecystectomy. No evidence of postoperative complication.  Pelvis: Normal bladder. Probable partially exophytic subserosal fibroid originating from the right aspect of the uterine fundus. The left adnexa is unremarkable. Small amount of free fluid in anatomic pelvis may be physiologic versus rupture of a cystic structure affiliated with the right ovary. No suspicious adenopathy.  Bones/Soft Tissues: No acute fracture or aggressive appearing lytic or blastic osseous lesion. L5-S1 degenerative disc disease with significant loss of disc space height and endplate sclerosis.  Vascular: Trace atherosclerotic vascular calcifications without aneurysmal dilatation. Incidental note is made of a circumaortic left renal vein.  IMPRESSION: 1. Nonspecific small free fluid in the left upper quadrant adjacent to the splenic flexure of the colon. Although there is no definite associated colonic wall thickening or inflammation, a focal colitis is not excluded entirely. 2. Small amount of free fluid in the pelvis may be related to the process in the left upper quadrant, or reflect rupture of a right ovarian cyst. 3. Probable exophytic uterine fibroid from the right uterine fundus. Alternately, this could represent a mass in the region of the right adnexa. Overall, findings are similar compared to 08/05/2012. Again, further evaluation with transvaginal ultrasound may be helpful to further evaluate the anatomy. 4. Cholelithiasis. 5. Colonic diverticular disease without CT evidence of active inflammation.   Electronically Signed   By: Malachy Moan M.D.   On: 12/29/2012 11:29   Dg Abd 2 Views  12/28/2012   CLINICAL DATA:  Mid abdominal pain and nausea.  EXAM: ABDOMEN - 2 VIEW   COMPARISON:  CT scan thigh 11/05/2012.  FINDINGS: Of the bowel gas pattern is unremarkable. No findings for obstruction or perforation. The soft tissue shadows are grossly maintained. The lung bases are clear. The bony structures are intact.  IMPRESSION: Unremarkable abdominal radiographs.   Electronically Signed   By: Loralie Champagne M.D.   On: 12/28/2012 13:42    Medications: . pantoprazole (PROTONIX) IV  40 mg Intravenous Daily  . piperacillin-tazobactam (ZOSYN)  IV  3.375 g Intravenous Q8H    Assessment/Plan Abdominal pain and nausea of uncertain etiology, normal labs Non specific colitis (On day 2 of Zosyn) Cholelithiasis Diverticular disease without diverticulitis Hx of Hypertension and Lap Appy 08/05/12 DVT__start heparin this AM    Plan: Advance to full liquids, mobilize, continue antibiotics, and recheck labs in AM. If she does well advance diet to regular in AM and possible home tomorrow.  LOS: 2 days    [place] Urieta 12/30/2012

## 2012-12-31 ENCOUNTER — Telehealth (INDEPENDENT_AMBULATORY_CARE_PROVIDER_SITE_OTHER): Payer: Self-pay

## 2012-12-31 LAB — CBC
HCT: 38.4 % (ref 36.0–46.0)
Hemoglobin: 13.3 g/dL (ref 12.0–15.0)
MCHC: 34.6 g/dL (ref 30.0–36.0)
MCV: 92.8 fL (ref 78.0–100.0)
RBC: 4.14 MIL/uL (ref 3.87–5.11)

## 2012-12-31 LAB — BASIC METABOLIC PANEL
BUN: 9 mg/dL (ref 6–23)
Chloride: 102 mEq/L (ref 96–112)
Creatinine, Ser: 0.89 mg/dL (ref 0.50–1.10)
GFR calc non Af Amer: 72 mL/min — ABNORMAL LOW (ref >90)
Glucose, Bld: 93 mg/dL (ref 70–99)
Potassium: 3.5 mEq/L (ref 3.5–5.1)

## 2012-12-31 MED ORDER — CIPROFLOXACIN HCL 500 MG PO TABS: 500.0000 mg | ORAL_TABLET | Freq: Two times a day (BID) | ORAL | Status: DC

## 2012-12-31 MED ORDER — HYDROCODONE-ACETAMINOPHEN 5-325 MG PO TABS: 1.0000 | ORAL_TABLET | ORAL | Status: DC | PRN

## 2012-12-31 MED ORDER — METRONIDAZOLE 500 MG PO TABS: 500.0000 mg | ORAL_TABLET | Freq: Three times a day (TID) | ORAL | Status: DC

## 2012-12-31 NOTE — Discharge Summary (Signed)
General surgery attending note:   I have interviewed and examined this patient this morning. I agree with the assessment and discharge planning outlined by Ms. Dort, PA.  She is asymptomatic. She will be treated empirically for diverticulitis. I have asked her to followup with her gastroenterologist in about one month to see if any further dilation was in order. There does not appear to be any acute surgical needed this point.   Nicole Campos. Derrell Lolling, M.D., [facility] General and Minimally invasive Surgery Breast and Colorectal Surgery Office:   [phone] Pager:   [phone]

## 2012-12-31 NOTE — Telephone Encounter (Signed)
LMOM that identified as Nicole Campos. Per Dr Gerrit Friends pt does not need to make appt with Dr Gerrit Friends or Dr Derrell Lolling if she is improving. Pt needs to f/u with Dr Dulce Sellar in 2-3 weeks. Dr Gerrit Friends states he will gladly see pt is she has concerns or wants to make appt with him otherwise f/u with GI.

## 2012-12-31 NOTE — Discharge Summary (Signed)
Physician Discharge Summary  Patient ID: Nicole Campos MRN: [id number] DOB/AGE: June 04, 1960 52 y.o.  Admit date: 12/28/2012 Discharge date: 12/31/2012  Admitting Diagnosis: Epigastric abdominal pain Nausea  Discharge Diagnosis Patient Active Problem List   Diagnosis Date Noted  . Abdominal pain, epigastric 12/29/2012  . Acute appendicitis with peritoneal abscess 08/05/2012    Consultants None  Imaging: Ct Abdomen Pelvis W Contrast  12/29/2012   CLINICAL DATA:  Sharp epigastric pain, nausea and vomiting  EXAM: CT ABDOMEN AND PELVIS WITH CONTRAST  TECHNIQUE: Multidetector CT imaging of the abdomen and pelvis was performed using the standard protocol following bolus administration of intravenous contrast.  CONTRAST:  50mL OMNIPAQUE IOHEXOL 300 MG/ML SOLN, OMNIPAQUE IOHEXOL 300 MG/ML SOLN  COMPARISON:  Prior CT abdomen/ pelvis 08/05/2012  FINDINGS: Lower Chest: Stable 9 mm pulmonary nodule versus nodular pleural parenchymal scarring in the inferior lingula. Scarring is favored. Dependent atelectasis is noted in the lower lobes. The visualized cardiac structures are within normal limits for size. No pericardial effusion. Unremarkable visualized distal thoracic esophagus.  Abdomen: Unremarkable CT appearance of the stomach, duodenum, spleen, adrenal glands and pancreas. Normal hepatic contours and morphology. No focal lesion. Cholelithiasis again and noted without gallbladder wall thickening or pericholecystic fluid.  Unremarkable appearance of the bilateral kidneys. No focal solid lesion, hydronephrosis or nephrolithiasis.  Colonic diverticular disease without CT evidence of active inflammation. There is a small amount of low attenuation free fluid in the left upper quadrant in the upper pericolic gutter adjacent to the anterior inferior tip of the spleen, the splenic flexure of the colon and of the pancreatic tail. No definite focal wall thickening or abnormality in the adjacent colon, no  abnormality in the adjacent spleen, and the pancreatic tail also appears unremarkable with a preserved but thin fat plane between the tail and the fluid. Surgical changes of cholecystectomy. No evidence of postoperative complication.  Pelvis: Normal bladder. Probable partially exophytic subserosal fibroid originating from the right aspect of the uterine fundus. The left adnexa is unremarkable. Small amount of free fluid in anatomic pelvis may be physiologic versus rupture of a cystic structure affiliated with the right ovary. No suspicious adenopathy.  Bones/Soft Tissues: No acute fracture or aggressive appearing lytic or blastic osseous lesion. L5-S1 degenerative disc disease with significant loss of disc space height and endplate sclerosis.  Vascular: Trace atherosclerotic vascular calcifications without aneurysmal dilatation. Incidental note is made of a circumaortic left renal vein.  IMPRESSION: 1. Nonspecific small free fluid in the left upper quadrant adjacent to the splenic flexure of the colon. Although there is no definite associated colonic wall thickening or inflammation, a focal colitis is not excluded entirely. 2. Small amount of free fluid in the pelvis may be related to the process in the left upper quadrant, or reflect rupture of a right ovarian cyst. 3. Probable exophytic uterine fibroid from the right uterine fundus. Alternately, this could represent a mass in the region of the right adnexa. Overall, findings are similar compared to 08/05/2012. Again, further evaluation with transvaginal ultrasound may be helpful to further evaluate the anatomy. 4. Cholelithiasis. 5. Colonic diverticular disease without CT evidence of active inflammation.   Electronically Signed   By: Malachy Moan M.D.   On: 12/29/2012 11:29    Procedures None  Hospital Course:  52 year old female known to our surgical service from laparoscopic appendectomy in May 2014. Patient was in her normal state of good health  until this morning. Following a normal bowel movement she developed upper  abdominal pain. This persisted and was associated with nausea but no emesis. Patient has had no further bowel movements. She denies diarrhea or bleeding per rectum. Patient presented to [facility]. Evaluation included an ultrasound of the which shows a solitary gallstone and sludge in the gallbladder. There are no acute inflammatory signs. Laboratory studies are essentially normal. Patient has had persistent pain and nausea despite repeated administration of narcotics and antiemetics. The surgical service is asked to evaluate and manage.  Prior abdominal surgery including laparoscopic appendectomy in May 2014. No prior episodes of abdominal pain or episodes of biliary colic. Denies jaundice. Denies acholic stools.  Patient was serial exams, pain control, and bowel rest.  She was treated conservatively and her symptoms improved quickly.  Diet was advanced as tolerated.  On HD #4, the patient was voiding well, tolerating diet, ambulating well, pain well controlled, vital signs stable, and felt stable for discharge home.  Patient will follow up in our office as needed and knows to call with questions or concerns.  She should follow up with her GI doctor.    Physical Exam: General:  Alert, NAD, pleasant, comfortable Abd:  Soft, ND, no abdominal tenderness, +BS, no HSM    Medication List         ADVIL PO  Take 3 tablets by mouth 2 (two) times daily as needed (pain).     ciprofloxacin 500 MG tablet  Commonly known as:  CIPRO  Take 1 tablet (500 mg total) by mouth 2 (two) times daily.     HYDROcodone-acetaminophen 5-325 MG per tablet  Commonly known as:  NORCO/VICODIN  Take 1-2 tablets by mouth every 4 (four) hours as needed.     metoprolol succinate 25 MG 24 hr tablet  Commonly known as:  TOPROL-XL  Take 25 mg by mouth every morning.     metroNIDAZOLE 500 MG tablet  Commonly known as:  FLAGYL  Take 1 tablet (500 mg total) by  mouth 3 (three) times daily.         Follow-up Information   Follow up with Velora Heckler, MD. Schedule an appointment as soon as possible for a visit in 2 weeks. (The office should call you with an appointment time)    Specialty:  General Surgery   Contact information:   [address] [place] [address] [phone]       Follow up with Freddy Jaksch, MD. Schedule an appointment as soon as possible for a visit in 2 weeks. (Call for an appointment)    Specialty:  Gastroenterology   Contact information:   [address], [address] [place] [address] [phone]       Signed: Candiss Norse [facility] [phone]  12/31/2012, 9:13 AM

## 2012-12-31 NOTE — Progress Notes (Signed)
Discharge instructions reviewed with patient, vital signs are stable, patient is tolerating diet without complaints of nausea or vomiting, patient to follow up with doctor upon discharge Stanford Breed RN 12-31-2012 10:15am

## 2013-01-16 ENCOUNTER — Other Ambulatory Visit: Payer: Self-pay

## 2013-01-23 ENCOUNTER — Other Ambulatory Visit (HOSPITAL_COMMUNITY): Payer: Self-pay | Admitting: Gastroenterology

## 2013-01-23 DIAGNOSIS — R109 Unspecified abdominal pain: Secondary | ICD-10-CM

## 2013-01-30 ENCOUNTER — Ambulatory Visit (HOSPITAL_COMMUNITY)
Admission: RE | Admit: 2013-01-30 | Discharge: 2013-01-30 | Disposition: A | Discharge status: Home / Self Care | Payer: Managed Care, Other (non HMO) | Source: Ambulatory Visit | Attending: Gastroenterology | Admitting: Gastroenterology

## 2013-01-30 DIAGNOSIS — R109 Unspecified abdominal pain: Secondary | ICD-10-CM | POA: Insufficient documentation

## 2013-01-30 MED ORDER — TECHNETIUM TC 99M MEBROFENIN IV KIT
5.0000 | PACK | Freq: Once | INTRAVENOUS | Status: AC | PRN
  Administered 2013-01-30: 5 via INTRAVENOUS

## 2013-01-30 MED ORDER — STERILE WATER FOR INJECTION IJ SOLN
INTRAMUSCULAR | Status: AC
  Administered 2013-01-30: 10 mL via INTRAVENOUS
  Filled 2013-01-30: qty 10

## 2013-01-30 MED ORDER — SINCALIDE 5 MCG IJ SOLR
INTRAMUSCULAR | Status: AC
  Administered 2013-01-30: 2.08 ug via INTRAVENOUS
  Filled 2013-01-30: qty 10

## 2013-02-19 ENCOUNTER — Ambulatory Visit (INDEPENDENT_AMBULATORY_CARE_PROVIDER_SITE_OTHER): Payer: Managed Care, Other (non HMO) | Admitting: General Surgery

## 2013-02-19 ENCOUNTER — Encounter (INDEPENDENT_AMBULATORY_CARE_PROVIDER_SITE_OTHER): Payer: Self-pay | Admitting: General Surgery

## 2013-02-19 VITALS — BP 138/80 | HR 72 | Resp 16 | Ht 72.0 in | Wt 230.0 lb

## 2013-02-19 DIAGNOSIS — K828 Other specified diseases of gallbladder: Secondary | ICD-10-CM

## 2013-02-19 NOTE — Progress Notes (Signed)
Patient ID: Nicole Campos, female   DOB: 06/16/1960, 52 y.o.   MRN: [id number]  Chief Complaint  Patient presents with  . Abdominal Pain    HPI Nicole Campos is a 52 y.o. female.  The patient is a 52-year-old female who was previously seen in the hospital secondary to a diagnosis of diverticulitis. Patient subsequently was worked up by Dr. Outlaw for evaluation with an EGD as well as a HIDA scan. HIDA scan resulted in an ejection fraction of 9%. The patient has continuous abdominal pain she notices more recurrent with spicy/fatty foods. HPI  Past Medical History  Diagnosis Date  . Hypertension   . PONV (postoperative nausea and vomiting)     Past Surgical History  Procedure Laterality Date  . Cesarean section    . Knee surgery      Left knee - meniscus tear  . Wisdom teeth    . Laparoscopic appendectomy N/A 08/05/2012    Procedure: APPENDECTOMY LAPAROSCOPIC;  Surgeon: Thomas A. Cornett, MD;  Location: [facility];  Service: General;  Laterality: N/A;    History reviewed. No pertinent family history.  Social History History  Substance Use Topics  . Smoking status: Never Smoker   . Smokeless tobacco: Never Used  . Alcohol Use: Yes     Comment: occasional    Allergies  Allergen Reactions  . Penicillins Itching    Current Outpatient Prescriptions  Medication Sig Dispense Refill  . ciprofloxacin (CIPRO) 500 MG tablet Take 1 tablet (500 mg total) by mouth 2 (two) times daily.  28 tablet  0  . HYDROcodone-acetaminophen (NORCO/VICODIN) 5-325 MG per tablet Take 1-2 tablets by mouth every 4 (four) hours as needed.  30 tablet  0  . Ibuprofen (ADVIL PO) Take 3 tablets by mouth 2 (two) times daily as needed (pain).      . metoprolol succinate (TOPROL-XL) 25 MG 24 hr tablet Take 25 mg by mouth every morning.      . metroNIDAZOLE (FLAGYL) 500 MG tablet Take 1 tablet (500 mg total) by mouth 3 (three) times daily.  42 tablet  0   No current facility-administered medications for this  visit.    Review of Systems Review of Systems  Constitutional: Negative.   HENT: Negative.   Respiratory: Negative.   Cardiovascular: Negative.   Gastrointestinal: Positive for nausea and abdominal pain.  Neurological: Negative.   All other systems reviewed and are negative.    Blood pressure 138/80, pulse 72, resp. rate 16, height 6' (1.829 m), weight 230 lb (104.327 kg).  Physical Exam Physical Exam  Constitutional: She is oriented to person, place, and time. She appears well-developed and well-nourished.  HENT:  Head: Normocephalic and atraumatic.  Eyes: Conjunctivae and EOM are normal. Pupils are equal, round, and reactive to light.  Neck: Normal range of motion. Neck supple.  Cardiovascular: Normal rate, regular rhythm and normal heart sounds.   Pulmonary/Chest: Effort normal and breath sounds normal.  Abdominal: Soft. There is no tenderness. There is no rebound and no guarding.  Musculoskeletal: Normal range of motion.  Neurological: She is alert and oriented to person, place, and time.  Skin: Skin is warm and dry.  Psychiatric: She has a normal mood and affect.    Data Reviewed HIDA scan was 9%  Assessment    52-year-old female with biliary dyskinesia as well as cholelithiasis     Plan    1. We'll proceed to the operating room for laparoscopic cholecystectomy 2. All risks and benefits were   discussed with the patient to generally include: infection, bleeding, possible need for post op ERCP, damage to the bile ducts, and bile leak. Alternatives were offered and described.  All questions were answered and the patient voiced understanding of the procedure and wishes to proceed at this point with a laparoscopic cholecystectomy         Aretha Levi Jr., Janeane Cozart 02/19/2013, 4:48 PM    

## 2013-02-21 ENCOUNTER — Encounter (HOSPITAL_COMMUNITY): Payer: Self-pay | Admitting: Pharmacy Technician

## 2013-02-25 ENCOUNTER — Encounter (HOSPITAL_COMMUNITY)
Admission: RE | Admit: 2013-02-25 | Discharge: 2013-02-25 | Disposition: A | Discharge status: Home / Self Care | Payer: Managed Care, Other (non HMO) | Source: Ambulatory Visit | Attending: General Surgery | Admitting: General Surgery

## 2013-02-25 ENCOUNTER — Ambulatory Visit (HOSPITAL_COMMUNITY)
Admission: RE | Admit: 2013-02-25 | Discharge: 2013-02-25 | Disposition: A | Discharge status: Home / Self Care | Payer: Managed Care, Other (non HMO) | Source: Ambulatory Visit | Attending: Anesthesiology | Admitting: Anesthesiology

## 2013-02-25 ENCOUNTER — Encounter (INDEPENDENT_AMBULATORY_CARE_PROVIDER_SITE_OTHER): Payer: Self-pay

## 2013-02-25 ENCOUNTER — Encounter (HOSPITAL_COMMUNITY): Payer: Self-pay

## 2013-02-25 DIAGNOSIS — I10 Essential (primary) hypertension: Secondary | ICD-10-CM | POA: Insufficient documentation

## 2013-02-25 HISTORY — DX: Palpitations: R00.2

## 2013-02-25 HISTORY — DX: Headache: R51

## 2013-02-25 HISTORY — DX: Unspecified osteoarthritis, unspecified site: M19.90

## 2013-02-25 LAB — CBC
HCT: 39.2 % (ref 36.0–46.0)
Hemoglobin: 13.8 g/dL (ref 12.0–15.0)
MCH: 33.2 pg (ref 26.0–34.0)
MCHC: 35.2 g/dL (ref 30.0–36.0)
Platelets: 297 10*3/uL (ref 150–400)

## 2013-02-25 LAB — BASIC METABOLIC PANEL
BUN: 13 mg/dL (ref 6–23)
Calcium: 9.6 mg/dL (ref 8.4–10.5)
GFR calc non Af Amer: >90 mL/min (ref >90)
Glucose, Bld: 86 mg/dL (ref 70–99)
Potassium: 4.1 mEq/L (ref 3.5–5.1)
Sodium: 139 mEq/L (ref 135–145)

## 2013-02-25 NOTE — Pre-Procedure Instructions (Signed)
Nicole Campos  02/25/2013   Your procedure is scheduled on:  Friday, February 28, 2013 at 3:05 PM  Report to [facility] Short Stay (use Main Entrance "A'') at 1:00 PM  Call this number if you have problems the morning of surgery: [phone]   Remember:   Do not eat food or drink liquids after midnight.   Take these medicines the morning of surgery with A SIP OF WATER: metoprolol succinate (TOPROL-XL) 25 MG 24 hr tablet Stop taking Aspirin, vitamins and herbal medications. Do not take any NSAIDs ie: Ibuprofen, Advil, Naproxen or any medication containing Aspirin.  Do not wear jewelry, make-up or nail polish.  Do not wear lotions, powders, or perfumes. You may wear deodorant.  Do not shave 48 hours prior to surgery.   Do not bring valuables to the hospital.  [facility] is not responsible for any belongings or valuables.               Contacts, dentures or bridgework may not be worn into surgery.  Leave suitcase in the car. After surgery it may be brought to your room.  For patients admitted to the hospital, discharge time is determined by your  treatment team.               Patients discharged the day of surgery will not be allowed to drive home.  Name and phone number of your driver:  Special Instructions: Shower using CHG 2 nights before surgery and the night before surgery.  If you shower the day of surgery use CHG.  Use special wash - you have one bottle of CHG for all showers.  You should use approximately 1/3 of the bottle for each shower.   Please read over the following fact sheets that you were given: Pain Booklet, Coughing and Deep Breathing and Surgical Site Infection Prevention

## 2013-02-27 MED ORDER — CIPROFLOXACIN IN D5W 400 MG/200ML IV SOLN
400.0000 mg | INTRAVENOUS | Status: AC
  Administered 2013-02-28: 400 mg via INTRAVENOUS
  Filled 2013-02-27: qty 200

## 2013-02-27 MED ORDER — CHLORHEXIDINE GLUCONATE 4 % EX LIQD: 1.0000 "application " | Freq: Once | CUTANEOUS | Status: DC

## 2013-02-28 ENCOUNTER — Encounter (HOSPITAL_COMMUNITY)
Admission: RE | Disposition: A | Discharge status: Home / Self Care | Payer: Self-pay | Source: Ambulatory Visit | Attending: General Surgery

## 2013-02-28 ENCOUNTER — Encounter (HOSPITAL_COMMUNITY): Payer: Self-pay | Admitting: Anesthesiology

## 2013-02-28 ENCOUNTER — Ambulatory Visit (HOSPITAL_COMMUNITY)
Admission: RE | Admit: 2013-02-28 | Discharge: 2013-02-28 | Disposition: A | Discharge status: Home / Self Care | Payer: Managed Care, Other (non HMO) | Source: Ambulatory Visit | Attending: General Surgery | Admitting: General Surgery

## 2013-02-28 ENCOUNTER — Encounter (HOSPITAL_COMMUNITY): Payer: Managed Care, Other (non HMO) | Admitting: Certified Registered"

## 2013-02-28 ENCOUNTER — Ambulatory Visit (HOSPITAL_COMMUNITY): Payer: Managed Care, Other (non HMO) | Admitting: Certified Registered"

## 2013-02-28 DIAGNOSIS — I10 Essential (primary) hypertension: Secondary | ICD-10-CM | POA: Insufficient documentation

## 2013-02-28 DIAGNOSIS — K8010 Calculus of gallbladder with chronic cholecystitis without obstruction: Secondary | ICD-10-CM

## 2013-02-28 DIAGNOSIS — K824 Cholesterolosis of gallbladder: Secondary | ICD-10-CM | POA: Insufficient documentation

## 2013-02-28 DIAGNOSIS — K828 Other specified diseases of gallbladder: Secondary | ICD-10-CM | POA: Insufficient documentation

## 2013-02-28 HISTORY — PX: CHOLECYSTECTOMY: SHX55

## 2013-02-28 SURGERY — LAPAROSCOPIC CHOLECYSTECTOMY
Anesthesia: General | Site: Abdomen

## 2013-02-28 MED ORDER — OXYCODONE HCL 5 MG PO TABS
5.0000 mg | ORAL_TABLET | Freq: Once | ORAL | Status: AC | PRN
  Administered 2013-02-28: 5 mg via ORAL

## 2013-02-28 MED ORDER — OXYCODONE HCL 5 MG/5ML PO SOLN: 5.0000 mg | Freq: Once | ORAL | Status: AC | PRN

## 2013-02-28 MED ORDER — HYDROMORPHONE HCL PF 1 MG/ML IJ SOLN
INTRAMUSCULAR | Status: AC
  Filled 2013-02-28: qty 1

## 2013-02-28 MED ORDER — METOCLOPRAMIDE HCL 5 MG/ML IJ SOLN
INTRAMUSCULAR | Status: AC
  Filled 2013-02-28: qty 2

## 2013-02-28 MED ORDER — LACTATED RINGERS IV SOLN
INTRAVENOUS | Status: DC
  Administered 2013-02-28 (×2): via INTRAVENOUS

## 2013-02-28 MED ORDER — GLYCOPYRROLATE 0.2 MG/ML IJ SOLN
INTRAMUSCULAR | Status: DC | PRN
  Administered 2013-02-28: 0.4 mg via INTRAVENOUS
  Administered 2013-02-28: 0.2 mg via INTRAVENOUS

## 2013-02-28 MED ORDER — BUPIVACAINE HCL (PF) 0.25 % IJ SOLN
INTRAMUSCULAR | Status: AC
  Filled 2013-02-28: qty 30

## 2013-02-28 MED ORDER — FENTANYL CITRATE 0.05 MG/ML IJ SOLN
INTRAMUSCULAR | Status: DC | PRN
  Administered 2013-02-28: 50 ug via INTRAVENOUS
  Administered 2013-02-28 (×2): 100 ug via INTRAVENOUS

## 2013-02-28 MED ORDER — MIDAZOLAM HCL 5 MG/5ML IJ SOLN
INTRAMUSCULAR | Status: DC | PRN
  Administered 2013-02-28: 2 mg via INTRAVENOUS

## 2013-02-28 MED ORDER — HYDROCODONE-IBUPROFEN 7.5-200 MG PO TABS: 1.0000 | ORAL_TABLET | Freq: Three times a day (TID) | ORAL | Status: DC | PRN

## 2013-02-28 MED ORDER — LIDOCAINE HCL (CARDIAC) 20 MG/ML IV SOLN
INTRAVENOUS | Status: DC | PRN
  Administered 2013-02-28: 100 mg via INTRAVENOUS

## 2013-02-28 MED ORDER — 0.9 % SODIUM CHLORIDE (POUR BTL) OPTIME
TOPICAL | Status: DC | PRN
  Administered 2013-02-28: 1000 mL

## 2013-02-28 MED ORDER — HYDROMORPHONE HCL PF 1 MG/ML IJ SOLN
0.2500 mg | INTRAMUSCULAR | Status: DC | PRN
  Administered 2013-02-28 (×2): 0.25 mg via INTRAVENOUS
  Administered 2013-02-28: 0.5 mg via INTRAVENOUS
  Administered 2013-02-28: 0.25 mg via INTRAVENOUS
  Administered 2013-02-28: 0.5 mg via INTRAVENOUS
  Administered 2013-02-28: 0.25 mg via INTRAVENOUS

## 2013-02-28 MED ORDER — BUPIVACAINE HCL 0.25 % IJ SOLN
INTRAMUSCULAR | Status: DC | PRN
  Administered 2013-02-28: 12 mL

## 2013-02-28 MED ORDER — DEXAMETHASONE SODIUM PHOSPHATE 10 MG/ML IJ SOLN
INTRAMUSCULAR | Status: DC | PRN
  Administered 2013-02-28: 8 mg via INTRAVENOUS

## 2013-02-28 MED ORDER — SODIUM CHLORIDE 0.9 % IR SOLN
Status: DC | PRN
  Administered 2013-02-28: 1000 mL

## 2013-02-28 MED ORDER — METOCLOPRAMIDE HCL 5 MG/ML IJ SOLN
10.0000 mg | Freq: Once | INTRAMUSCULAR | Status: AC | PRN
  Administered 2013-02-28: 10 mg via INTRAVENOUS

## 2013-02-28 MED ORDER — OXYCODONE HCL 5 MG PO TABS
ORAL_TABLET | ORAL | Status: AC
  Filled 2013-02-28: qty 1

## 2013-02-28 MED ORDER — ONDANSETRON HCL 4 MG/2ML IJ SOLN
INTRAMUSCULAR | Status: DC | PRN
  Administered 2013-02-28: 4 mg via INTRAVENOUS

## 2013-02-28 MED ORDER — ROCURONIUM BROMIDE 100 MG/10ML IV SOLN
INTRAVENOUS | Status: DC | PRN
  Administered 2013-02-28: 40 mg via INTRAVENOUS

## 2013-02-28 MED ORDER — PROPOFOL 10 MG/ML IV BOLUS
INTRAVENOUS | Status: DC | PRN
  Administered 2013-02-28: 30 mg via INTRAVENOUS
  Administered 2013-02-28: 200 mg via INTRAVENOUS

## 2013-02-28 MED ORDER — NEOSTIGMINE METHYLSULFATE 1 MG/ML IJ SOLN
INTRAMUSCULAR | Status: DC | PRN
  Administered 2013-02-28: 3 mg via INTRAVENOUS

## 2013-02-28 SURGICAL SUPPLY — 43 items
BENZOIN TINCTURE PRP APPL 2/3 (GAUZE/BANDAGES/DRESSINGS) ×2 IMPLANT
CANISTER SUCTION 2500CC (MISCELLANEOUS) ×2 IMPLANT
CHLORAPREP W/TINT 26ML (MISCELLANEOUS) ×2 IMPLANT
CLIP LIGATING HEMO O LOK GREEN (MISCELLANEOUS) ×4 IMPLANT
COVER MAYO STAND STRL (DRAPES) IMPLANT
COVER SURGICAL LIGHT HANDLE (MISCELLANEOUS) ×2 IMPLANT
COVER TRANSDUCER ULTRASND (DRAPES) ×2 IMPLANT
DEVICE TROCAR PUNCTURE CLOSURE (ENDOMECHANICALS) ×2 IMPLANT
DRAPE C-ARM 42X72 X-RAY (DRAPES) IMPLANT
DRAPE UTILITY 15X26 W/TAPE STR (DRAPE) ×4 IMPLANT
ELECT REM PT RETURN 9FT ADLT (ELECTROSURGICAL) ×2
ELECTRODE REM PT RTRN 9FT ADLT (ELECTROSURGICAL) ×1 IMPLANT
GAUZE SPONGE 2X2 8PLY STRL LF (GAUZE/BANDAGES/DRESSINGS) ×1 IMPLANT
GLOVE BIO SURGEON STRL SZ7.5 (GLOVE) ×4 IMPLANT
GLOVE BIOGEL PI IND STRL 7.0 (GLOVE) ×2 IMPLANT
GLOVE BIOGEL PI IND STRL 7.5 (GLOVE) ×1 IMPLANT
GLOVE BIOGEL PI INDICATOR 7.0 (GLOVE) ×2
GLOVE BIOGEL PI INDICATOR 7.5 (GLOVE) ×1
GLOVE SURG SS PI 7.0 STRL IVOR (GLOVE) ×2 IMPLANT
GOWN STRL NON-REIN LRG LVL3 (GOWN DISPOSABLE) ×4 IMPLANT
GOWN STRL REIN XL XLG (GOWN DISPOSABLE) ×2 IMPLANT
IV CATH 14GX2 1/4 (CATHETERS) IMPLANT
KIT BASIN OR (CUSTOM PROCEDURE TRAY) ×2 IMPLANT
KIT ROOM TURNOVER OR (KITS) ×2 IMPLANT
NEEDLE INSUFFLATION 14GA 120MM (NEEDLE) ×2 IMPLANT
NS IRRIG 1000ML POUR BTL (IV SOLUTION) ×2 IMPLANT
PAD ARMBOARD 7.5X6 YLW CONV (MISCELLANEOUS) ×4 IMPLANT
POUCH SPECIMEN RETRIEVAL 10MM (ENDOMECHANICALS) IMPLANT
SCISSORS LAP 5X35 DISP (ENDOMECHANICALS) ×2 IMPLANT
SET CHOLANGIOGRAPHY FRANKLIN (SET/KITS/TRAYS/PACK) IMPLANT
SET IRRIG TUBING LAPAROSCOPIC (IRRIGATION / IRRIGATOR) ×2 IMPLANT
SLEEVE ENDOPATH XCEL 5M (ENDOMECHANICALS) ×2 IMPLANT
SPECIMEN JAR SMALL (MISCELLANEOUS) ×2 IMPLANT
SPONGE GAUZE 2X2 STER 10/PKG (GAUZE/BANDAGES/DRESSINGS) ×1
STRIP CLOSURE SKIN 1/2X4 (GAUZE/BANDAGES/DRESSINGS) IMPLANT
SUT MNCRL AB 3-0 PS2 18 (SUTURE) ×2 IMPLANT
TAPE CLOTH SURG 4X10 WHT LF (GAUZE/BANDAGES/DRESSINGS) ×2 IMPLANT
TOWEL OR 17X24 6PK STRL BLUE (TOWEL DISPOSABLE) ×2 IMPLANT
TOWEL OR 17X26 10 PK STRL BLUE (TOWEL DISPOSABLE) ×2 IMPLANT
TOWEL OR NON WOVEN STRL DISP B (DISPOSABLE) ×2 IMPLANT
TRAY LAPAROSCOPIC (CUSTOM PROCEDURE TRAY) ×2 IMPLANT
TROCAR XCEL NON-BLD 11X100MML (ENDOMECHANICALS) ×2 IMPLANT
TROCAR XCEL NON-BLD 5MMX100MML (ENDOMECHANICALS) ×2 IMPLANT

## 2013-02-28 NOTE — Anesthesia Preprocedure Evaluation (Signed)
Anesthesia Evaluation  Patient identified by MRN, date of birth, ID band Patient awake    Reviewed: Allergy & Precautions, H&P , NPO status , Patient's Chart, lab work & pertinent test results, reviewed documented beta blocker date and time   History of Anesthesia Complications (+) PONV and history of anesthetic complications  Airway Mallampati: II TM Distance: >3 FB Neck ROM: full    Dental   Pulmonary neg pulmonary ROS,  breath sounds clear to auscultation        Cardiovascular hypertension, On Medications Rhythm:regular     Neuro/Psych  Headaches, negative psych ROS   GI/Hepatic negative GI ROS, Neg liver ROS,   Endo/Other  negative endocrine ROS  Renal/GU negative Renal ROS  negative genitourinary   Musculoskeletal   Abdominal   Peds  Hematology negative hematology ROS (+)   Anesthesia Other Findings See surgeon's H&P   Reproductive/Obstetrics negative OB ROS                           Anesthesia Physical Anesthesia Plan  ASA: II  Anesthesia Plan: General   Post-op Pain Management:    Induction: Intravenous  Airway Management Planned: Oral ETT  Additional Equipment:   Intra-op Plan:   Post-operative Plan:   Informed Consent: I have reviewed the patients History and Physical, chart, labs and discussed the procedure including the risks, benefits and alternatives for the proposed anesthesia with the patient or authorized representative who has indicated his/her understanding and acceptance.   Dental Advisory Given  Plan Discussed with: CRNA and Surgeon  Anesthesia Plan Comments:         Anesthesia Quick Evaluation

## 2013-02-28 NOTE — Interval H&P Note (Signed)
History and Physical Interval Note:  02/28/2013 1:37 PM  Nicole Campos  has presented today for surgery, with the diagnosis of gallbladder  The various methods of treatment have been discussed with the patient and family. After consideration of risks, benefits and other options for treatment, the patient has consented to  Procedure(s): LAPAROSCOPIC CHOLECYSTECTOMY (N/A) as a surgical intervention .  The patient's history has been reviewed, patient examined, no change in status, stable for surgery.  I have reviewed the patient's chart and labs.  Questions were answered to the patient's satisfaction.     Marigene Ehlers., Jed Limerick

## 2013-02-28 NOTE — Transfer of Care (Signed)
Immediate Anesthesia Transfer of Care Note  Patient: Nicole Campos  Procedure(s) Performed: Procedure(s): LAPAROSCOPIC CHOLECYSTECTOMY (N/A)  Patient Location: PACU  Anesthesia Type:General  Level of Consciousness: awake, oriented, patient cooperative and responds to stimulation  Airway & Oxygen Therapy: Patient Spontanous Breathing and Patient connected to nasal cannula oxygen  Post-op Assessment: Report given to PACU RN, Post -op Vital signs reviewed and stable and Patient moving all extremities X 4  Post vital signs: Reviewed and stable  Complications: No apparent anesthesia complications

## 2013-02-28 NOTE — H&P (View-Only) (Signed)
Patient ID: Nicole Campos, female   DOB: [birth year], 52 y.o.   MRN: [id number]  Chief Complaint  Patient presents with  . Abdominal Pain    HPI Nicole Campos is a 52 y.o. female.  The patient is a 52 year old female who was previously seen in the hospital secondary to a diagnosis of diverticulitis. Patient subsequently was worked up by Dr. Dulce Sellar for evaluation with an EGD as well as a HIDA scan. HIDA scan resulted in an ejection fraction of 9%. The patient has continuous abdominal pain she notices more recurrent with spicy/fatty foods. HPI  Past Medical History  Diagnosis Date  . Hypertension   . PONV (postoperative nausea and vomiting)     Past Surgical History  Procedure Laterality Date  . Cesarean section    . Knee surgery      Left knee - meniscus tear  . Wisdom teeth    . Laparoscopic appendectomy N/A 08/05/2012    Procedure: APPENDECTOMY LAPAROSCOPIC;  Surgeon: Clovis Pu. Cornett, MD;  Location: [facility];  Service: General;  Laterality: N/A;    History reviewed. No pertinent family history.  Social History History  Substance Use Topics  . Smoking status: Never Smoker   . Smokeless tobacco: Never Used  . Alcohol Use: Yes     Comment: occasional    Allergies  Allergen Reactions  . Penicillins Itching    Current Outpatient Prescriptions  Medication Sig Dispense Refill  . ciprofloxacin (CIPRO) 500 MG tablet Take 1 tablet (500 mg total) by mouth 2 (two) times daily.  28 tablet  0  . HYDROcodone-acetaminophen (NORCO/VICODIN) 5-325 MG per tablet Take 1-2 tablets by mouth every 4 (four) hours as needed.  30 tablet  0  . Ibuprofen (ADVIL PO) Take 3 tablets by mouth 2 (two) times daily as needed (pain).      . metoprolol succinate (TOPROL-XL) 25 MG 24 hr tablet Take 25 mg by mouth every morning.      . metroNIDAZOLE (FLAGYL) 500 MG tablet Take 1 tablet (500 mg total) by mouth 3 (three) times daily.  42 tablet  0   No current facility-administered medications for this  visit.    Review of Systems Review of Systems  Constitutional: Negative.   HENT: Negative.   Respiratory: Negative.   Cardiovascular: Negative.   Gastrointestinal: Positive for nausea and abdominal pain.  Neurological: Negative.   All other systems reviewed and are negative.    Blood pressure 138/80, pulse 72, resp. rate 16, height 6' (1.829 m), weight 230 lb (104.327 kg).  Physical Exam Physical Exam  Constitutional: She is oriented to person, place, and time. She appears well-developed and well-nourished.  HENT:  Head: Normocephalic and atraumatic.  Eyes: Conjunctivae and EOM are normal. Pupils are equal, round, and reactive to light.  Neck: Normal range of motion. Neck supple.  Cardiovascular: Normal rate, regular rhythm and normal heart sounds.   Pulmonary/Chest: Effort normal and breath sounds normal.  Abdominal: Soft. There is no tenderness. There is no rebound and no guarding.  Musculoskeletal: Normal range of motion.  Neurological: She is alert and oriented to person, place, and time.  Skin: Skin is warm and dry.  Psychiatric: She has a normal mood and affect.    Data Reviewed HIDA scan was 9%  Assessment    52 year old female with biliary dyskinesia as well as cholelithiasis     Plan    1. We'll proceed to the operating room for laparoscopic cholecystectomy 2. All risks and benefits were  discussed with the patient to generally include: infection, bleeding, possible need for post op ERCP, damage to the bile ducts, and bile leak. Alternatives were offered and described.  All questions were answered and the patient voiced understanding of the procedure and wishes to proceed at this point with a laparoscopic cholecystectomy         Marigene Ehlers., [facility] 02/19/2013, 4:48 PM

## 2013-02-28 NOTE — Anesthesia Postprocedure Evaluation (Signed)
Anesthesia Post Note  Patient: Nicole Campos  Procedure(s) Performed: Procedure(s) (LRB): LAPAROSCOPIC CHOLECYSTECTOMY (N/A)  Anesthesia type: General  Patient location: PACU  Post pain: Pain level controlled  Post assessment: Patient's Cardiovascular Status Stable  Last Vitals:  Filed Vitals:   02/28/13 1619  BP: 119/67  Pulse: 51  Temp:   Resp: 13    Post vital signs: Reviewed and stable  Level of consciousness: alert  Complications: No apparent anesthesia complications

## 2013-02-28 NOTE — Op Note (Signed)
02/28/2013  2:52 PM  PATIENT:  Nicole Campos  52 y.o. female  PRE-OPERATIVE DIAGNOSIS:  biliary dyskinesia  POST-OPERATIVE DIAGNOSIS:  biliary dyskinesia  PROCEDURE:  Procedure(s): LAPAROSCOPIC CHOLECYSTECTOMY (N/A)  SURGEON:  Surgeon(s) and Role:    * Axel Filler, MD - Primary  ASSISTANTS: none   ANESTHESIA:   general  EBL:  Total I/O In: 1000 [I.V.:1000] Out: -   BLOOD ADMINISTERED:none  DRAINS: none   LOCAL MEDICATIONS USED:  MARCAINE     SPECIMEN:  Source of Specimen:  Gallbladder  DISPOSITION OF SPECIMEN:  PATHOLOGY  COUNTS:  YES  TOURNIQUET:  * No tourniquets in log *  DICTATION: .Dragon Dictation  Findings:chronically inflammed gallbladder  Indications for procedure: Pt is a 52 y/o F with RUQ pain and seen to have a low EF on HIDA scan.  Details of the procedure: The patient was taken to the operating and placed in the supine position with bilateral SCDs in place. A time out was called and all facts were verified. A pneumoperitoneum was obtained via A Veress needle technique to a pressure of 14mm of mercury. A 5mm trochar was then placed in the right upper quadrant under visualization, and there were no injuries to any abdominal organs. A 11 mm port was then placed in the umbilical region after infiltrating with local anesthesia under direct visualization. A second epigastric port was placed under direct visualization. The gallbladder was identified and retracted, the peritoneum was then sharply dissected from the gallbladder and this dissection was carried down to Calot's triangle. The cystic duct was identified and stripped away circumferentially and seen going into the gallbladder 360, the critical angle was obtained. It was noted to be very dilated and large. 2 clips were placed proximally one distally and the cystic duct transected. The cystic artery was identified and 2 clips placed proximally and one distally and transected. We then proceeded to remove  the gallbladder off the hepatic fossa with Bovie cautery. A retrieval bag was then placed in the abdomen and gallbladder placed in the bag. The hepatic fossa was then reexamined and hemostasis was achieved with Bovie cautery and was excellent at this portion of the case. The subhepatic fossa and perihepatic fossa was then irrigated until the effluent was clear. The 11 mm trocar fascia was reapproximated with the Endo Close #1 Vicryl x2. The pneumoperitoneum was evacuated and all trochars removed under direct visulalization. The skin was then closed with 4-0 Monocryl and the skin dressed with Steri-Strips, gauze, and tape. The patient was awaken from general anesthesia and taken to the recovery room in stable condition.    PLAN OF CARE: Discharge to home after PACU  PATIENT DISPOSITION:  PACU - hemodynamically stable.   Delay start of Pharmacological VTE agent (>24hrs) due to surgical blood loss or risk of bleeding: not applicable 4

## 2013-03-04 ENCOUNTER — Encounter (HOSPITAL_COMMUNITY): Payer: Self-pay | Admitting: General Surgery

## 2013-03-07 ENCOUNTER — Other Ambulatory Visit (INDEPENDENT_AMBULATORY_CARE_PROVIDER_SITE_OTHER): Payer: Self-pay | Admitting: Surgery

## 2013-03-07 ENCOUNTER — Telehealth (INDEPENDENT_AMBULATORY_CARE_PROVIDER_SITE_OTHER): Payer: Self-pay

## 2013-03-07 DIAGNOSIS — B370 Candidal stomatitis: Secondary | ICD-10-CM

## 2013-03-07 MED ORDER — MAGIC MOUTHWASH: 15.0000 mL | Freq: Four times a day (QID) | ORAL | Status: DC

## 2013-03-07 NOTE — Telephone Encounter (Signed)
Pt is s/p lap cholecystectomy on 02/28/13 by Dr. Derrell Lolling.  She calls today complaining of a coating on her tongue.  She says the inside of her mouth is sore.  She has a hx of thrush after taking antibiotics and/or pain medication.  She also says she is having some pain with her bowel movements.  I told her I was not sure that was related, but she seemed to think it was.  She denies internal hemorrhoids.  I told her I would consult our afternoon physician and call her back after 2:30 today with his recommendation.  She agreed with this plan.

## 2013-03-07 NOTE — Telephone Encounter (Signed)
Spoke with patient and informed her that we sent in a RX for magic mouthwash.

## 2013-03-10 ENCOUNTER — Telehealth (INDEPENDENT_AMBULATORY_CARE_PROVIDER_SITE_OTHER): Payer: Self-pay

## 2013-03-10 MED ORDER — NYSTATIN 500000 UNITS PO CAPS: 400000.0000 [IU] | ORAL_CAPSULE | Freq: Four times a day (QID) | ORAL | Status: DC

## 2013-03-10 NOTE — Telephone Encounter (Signed)
[facility] pharmacy calling to see if patient Magic Mouth Wash could be changed to Nystatin.  Reviewed with Dr. Gerrit Friends and rec'd verbal order to change to Nystatin IIU, take po, qid for 7 days, 0 refills.  Pharmacist changed RX.

## 2013-03-13 LAB — HM COLONOSCOPY

## 2013-03-21 ENCOUNTER — Ambulatory Visit (INDEPENDENT_AMBULATORY_CARE_PROVIDER_SITE_OTHER): Payer: Managed Care, Other (non HMO) | Admitting: General Surgery

## 2013-03-21 ENCOUNTER — Encounter (INDEPENDENT_AMBULATORY_CARE_PROVIDER_SITE_OTHER): Payer: Self-pay | Admitting: General Surgery

## 2013-03-21 VITALS — BP 121/75 | HR 70 | Temp 98.1°F | Resp 16 | Ht 72.0 in | Wt 228.6 lb

## 2013-03-21 DIAGNOSIS — Z9889 Other specified postprocedural states: Secondary | ICD-10-CM

## 2013-03-21 NOTE — Progress Notes (Signed)
Patient ID: Nicole HoyleSusan L Campos, female   DOB: Nov 14, 1960, 53 y.o.   MRN: [id number] Post op course Patient then did well postoperatively. She's had no further pain. She is tolerating regular diet and having normal bowel function  On Exam: Wounds are clean dry and intact  Pathology:   Chronic cholecystitis, cholesterolosis, and cholelithiasis.  This was discussed with the patient.  Assessment and Plan 53 year old female status post laparoscopic cholecystectomy 1. We discussed a low-fat diet 2. Patient to follow up as needed   Axel FillerArmando Keithon Mccoin, MD [facility] General & Minimally Invasive Surgery Trauma & Emergency Surgery

## 2014-02-18 IMAGING — NM NM HEPATO W/GB/PHARM/[PERSON_NAME]
2 series · 12 of 12 positions shown · non-contrast
Comparison: CT 12/29/2012.

RADIOPHARMACEUTICALS:  [id number] Choletec

CLINICAL DATA: History of mid abdominal pain. History of
cholelithiasis.

EXAM:
NUCLEAR MEDICINE HEPATOBILIARY IMAGING WITH GALLBLADDER EF
TECHNIQUE: Sequential images of the abdomen were obtained [DATE] minutes
following intravenous administration of radiopharmaceutical. After
slow intravenous infusion of 3.3micrograms Cholecystokinin,
gallbladder ejection fraction was determined. At 30 min, normal
ejection fraction is greater than 30%.

[Series 0: hepatobiliary · 3.20mm/px · 6 of 60 frames shown (1 of 2)]
[frame 6/60]
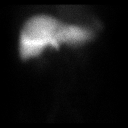
[frame 16/60]
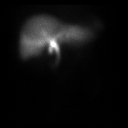
[frame 26/60]
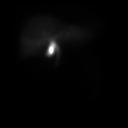
[frame 36/60]
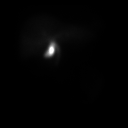
[frame 46/60]
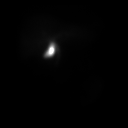
[frame 56/60]
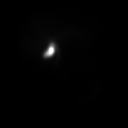

[Series 0: hepatobiliary · 3.20mm/px · 6 of 30 frames shown (2 of 2)]
[frame 3/30]
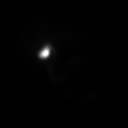
[frame 8/30]
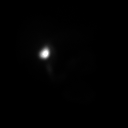
[frame 13/30]
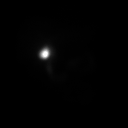
[frame 18/30]
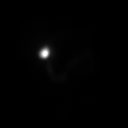
[frame 23/30]
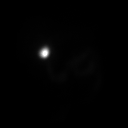
[frame 28/30]
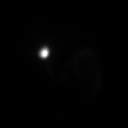

[12 of 12 positions shown; findings below may reference images not displayed]

FINDINGS: There was prompt visualization of hepatic activity. There was prompt
visualization of the gallbladder. This indicates patency of the
cystic duct with no evidence of cholecystitis. Subsequently
intestinal activity was seen indicating patency of the common bile
duct.

At 30 min the gallbladder had contracted 9.2%. This is abnormal.At
30 min, normal ejection fraction is greater than 30%.

The patient reported experiencing no symptoms during CCK infusion.
IMPRESSION: Demonstration of patency of the cystic duct and common bile duct.

No evidence of cholecystitis.

At 30 min the gallbladder had contracted 9.2%. This is abnormal.At
30 min, normal ejection fraction is greater than 30%.

The patient reported experiencing no symptoms during CCK infusion.

## 2014-02-24 ENCOUNTER — Other Ambulatory Visit: Payer: Self-pay | Admitting: Obstetrics and Gynecology

## 2014-02-24 LAB — HM PAP SMEAR

## 2014-02-26 LAB — CYTOLOGY - PAP

## 2014-03-16 IMAGING — CR DG CHEST 2V
2 series · 2 of 2 positions shown · non-contrast
Comparison: None.

CLINICAL DATA: Hypertension.

EXAM:
CHEST  2 VIEW

[w chest pa]
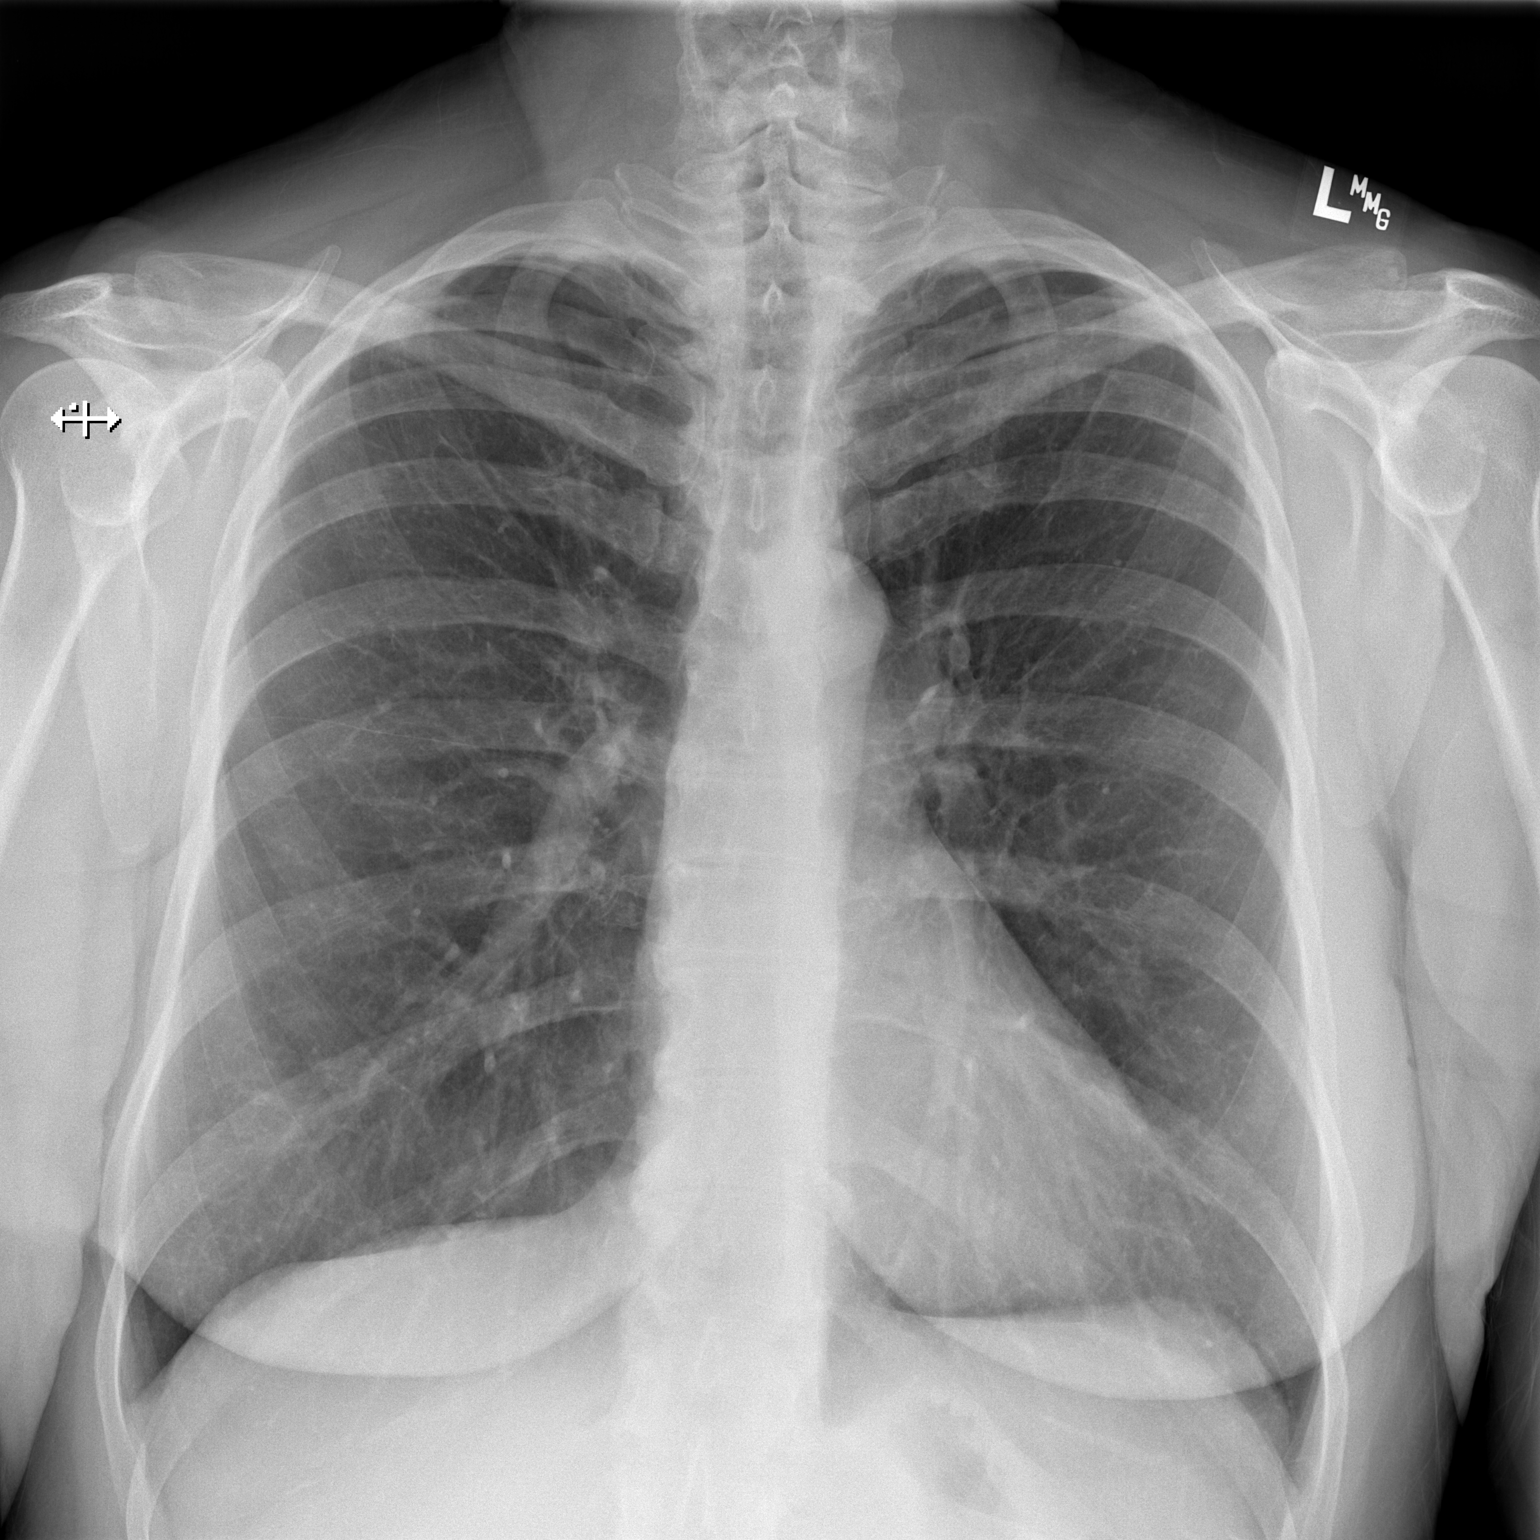

[w chest lat]
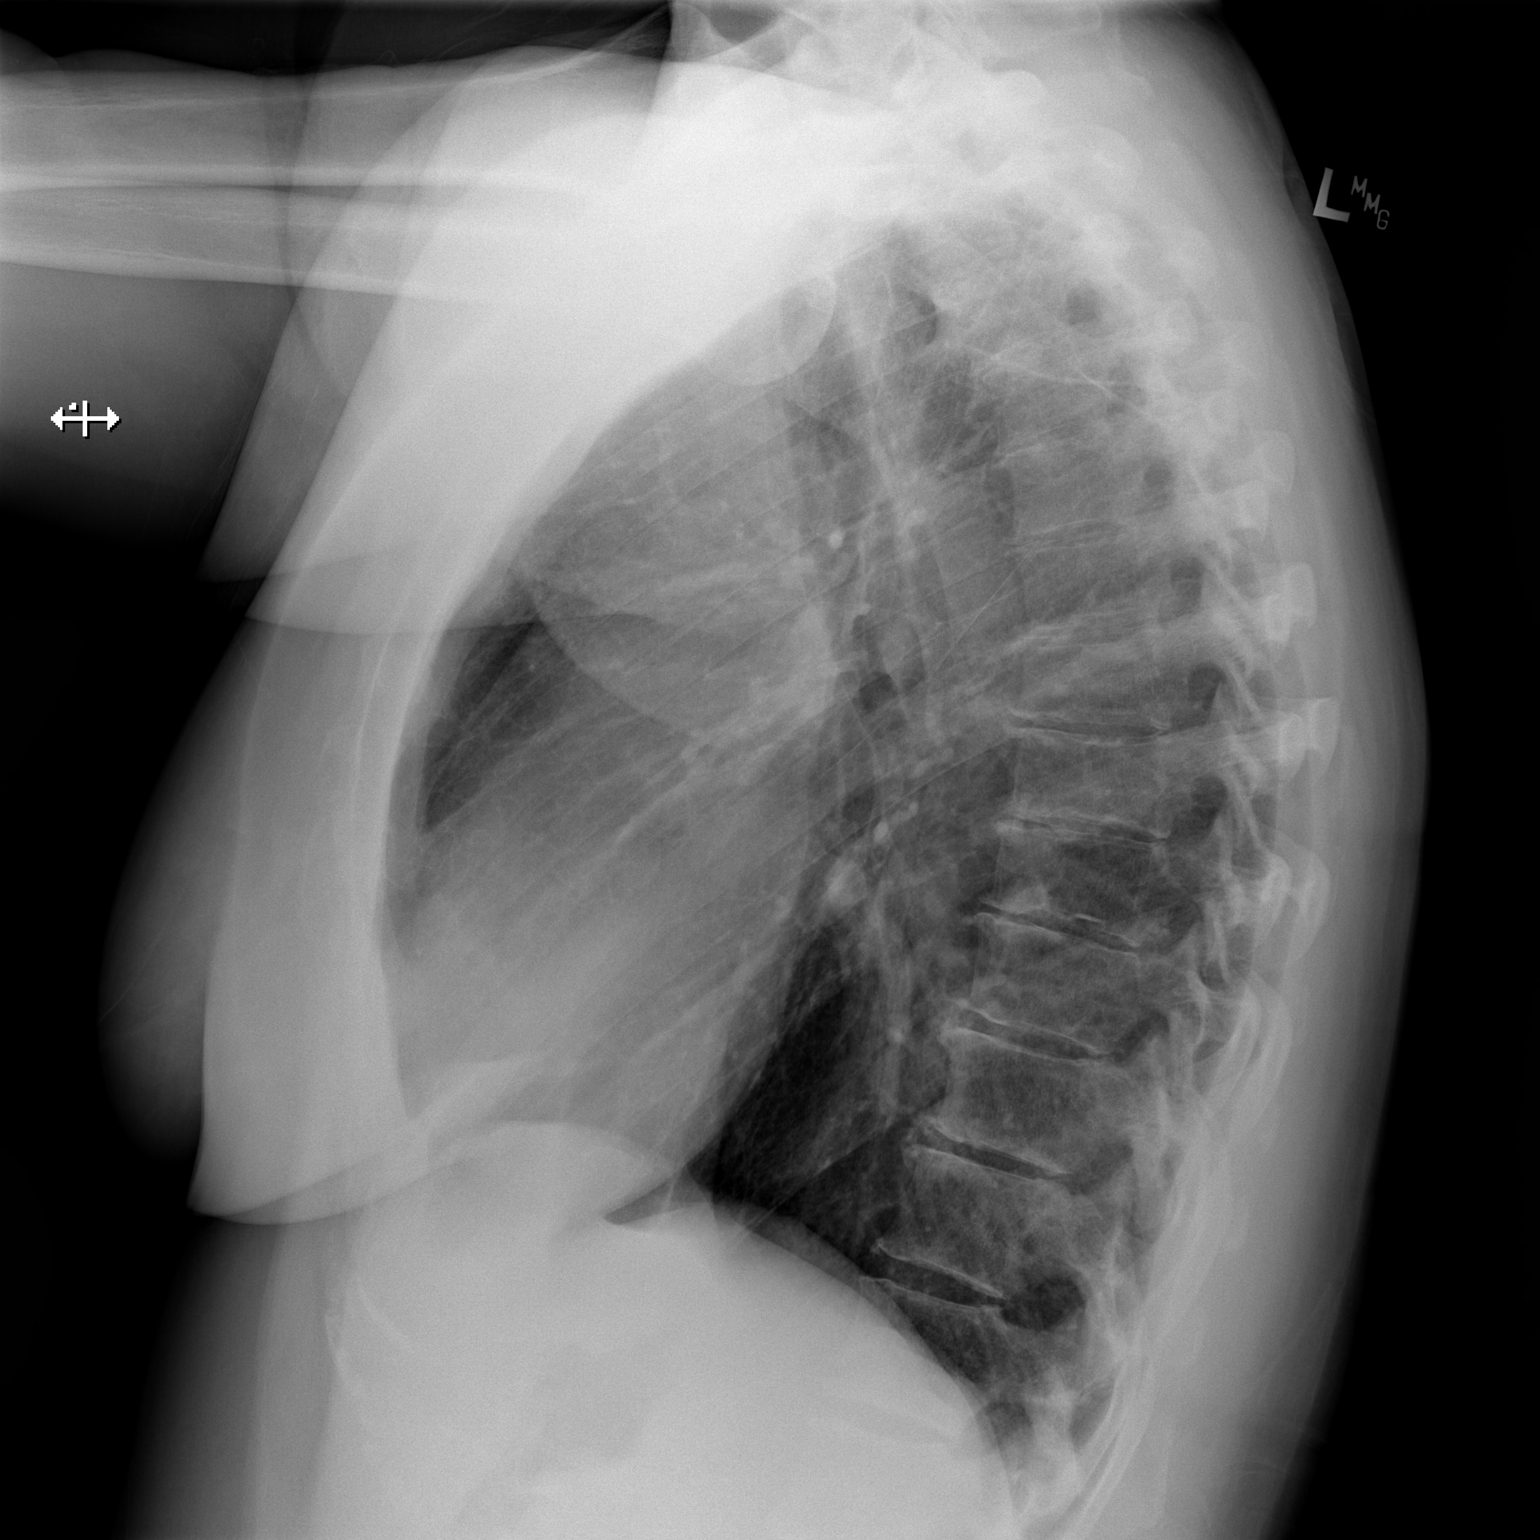

[2 of 2 positions shown; findings below may reference images not displayed]

FINDINGS: Mediastinum and hilar structures are normal. The lungs are clear. No
pleural effusion or pneumothorax. Mild apical pleural thickening
noted consistent with scarring. Heart size and pulmonary vascularity
normal. Degenerative changes thoracic spine.
IMPRESSION: No active cardiopulmonary disease.

## 2015-06-30 LAB — HM MAMMOGRAPHY

## 2016-03-31 ENCOUNTER — Encounter: Payer: Self-pay | Admitting: Family Medicine

## 2016-05-02 ENCOUNTER — Ambulatory Visit (INDEPENDENT_AMBULATORY_CARE_PROVIDER_SITE_OTHER): Payer: Commercial Managed Care - PPO | Admitting: Family Medicine

## 2016-05-02 ENCOUNTER — Encounter: Payer: Self-pay | Admitting: Family Medicine

## 2016-05-02 VITALS — BP 124/76 | HR 69 | Temp 98.4°F | Ht 70.5 in | Wt 215.0 lb

## 2016-05-02 DIAGNOSIS — Z1283 Encounter for screening for malignant neoplasm of skin: Secondary | ICD-10-CM

## 2016-05-02 DIAGNOSIS — Z23 Encounter for immunization: Secondary | ICD-10-CM

## 2016-05-02 DIAGNOSIS — I10 Essential (primary) hypertension: Secondary | ICD-10-CM | POA: Diagnosis not present

## 2016-05-02 DIAGNOSIS — R52 Pain, unspecified: Secondary | ICD-10-CM

## 2016-05-02 DIAGNOSIS — M1990 Unspecified osteoarthritis, unspecified site: Secondary | ICD-10-CM | POA: Diagnosis not present

## 2016-05-02 DIAGNOSIS — Z800 Family history of malignant neoplasm of digestive organs: Secondary | ICD-10-CM

## 2016-05-02 MED ORDER — METOPROLOL SUCCINATE ER 50 MG PO TB24: 50.0000 mg | ORAL_TABLET | Freq: Every day | ORAL | 3 refills | Status: DC

## 2016-05-02 NOTE — Assessment & Plan Note (Signed)
S: controlled on Metoprolol XL 50mg . Also helped with palpitations. Several years. Eats well, exercises regularly per her report. .  BP Readings from Last 3 Encounters:  05/02/16 124/76  03/21/13 121/75  02/28/13 130/86  A/P:Continue current meds:  Filled under my name today

## 2016-05-02 NOTE — Patient Instructions (Addendum)
Flu shot today  We will call you within a week or two about your referral to dermatology. If you do not hear within 3 weeks, give us a call.   You get a physical with gynecology but insurance typically covers with primary care as well if you wanted to do this in next 6-12 months with labs a week before  Health Maintenance Due  Topic Date Due  . Hepatitis C Screening - 1x lifetime screen 21-Jan-1961  . HIV Screening -1x lifetime screen 04/23/1973  . TETANUS/TDAP - have you had this? 04/23/1977      ______________________________________________________________________  Starting October 1st 2018, I will be transferring to our new location: [place] [facility] [place] [address]- [place] [id number] Phone: [phone]  I would love to have you remain my patient at this new location as long as it remains convenient for you. I am excited about the opportunity to have x-ray and sports medicine in the new building but will really miss the awesome staff and physicians at [facility]. [facility] to [facility] and we will automatically transfer them to the [facility] [place] location starting October 1st.

## 2016-05-02 NOTE — Assessment & Plan Note (Signed)
Brother with colon cancer. She sees Dr. Dulce Sellarutlaw with last colonoscopy 2015- with planned 5 year repeat

## 2016-05-02 NOTE — Progress Notes (Signed)
Phone: [phone]  Subjective:  Patient presents today to establish care.  Prior patient of Nemiah Commander (clinic closed). Chief complaint-noted.   See problem oriented charting  The following were reviewed and entered/updated in epic: Past Medical History:  Diagnosis Date  . Arthritis    cervical, lumbar, knees, feet. mild hands  . Headache(784.0)    Hx: of occasinal Migraine  . Heart palpitations    Hx: of  . Hypertension   . PONV (postoperative nausea and vomiting)    Patient Active Problem List   Diagnosis Date Noted  . Hypertension 05/02/2016    Priority: Medium  . Diffuse pain 05/02/2016  . Arthritis    Past Surgical History:  Procedure Laterality Date  . APPENDECTOMY    . CESAREAN SECTION  1990  . CHOLECYSTECTOMY N/A 02/28/2013   Procedure: LAPAROSCOPIC CHOLECYSTECTOMY;  Surgeon: Axel Filler, MD;  Location: [facility];  Service: General;  Laterality: N/A;  . COLONOSCOPY W/ POLYPECTOMY     Hx: of  . KNEE SURGERY     Left knee - meniscus tear  . LAPAROSCOPIC APPENDECTOMY N/A 08/05/2012   Procedure: APPENDECTOMY LAPAROSCOPIC;  Surgeon: Clovis Pu. Cornett, MD;  Location: [facility];  Service: General;  Laterality: N/A;  . wisdom teeth      Family History  Problem Relation Age of Onset  . Uterine cancer Mother   . Hypertension Mother   . Pancreatic cancer Father   . Cancer - Colon Brother   . Stroke Maternal Grandmother      late 52s  . Lung cancer Maternal Grandfather   . Heart attack Paternal Grandmother     late 43s  . Heart attack Paternal Grandfather     died 71    Medications- reviewed and updated Current Outpatient Prescriptions  Medication Sig Dispense Refill  . ibuprofen (ADVIL,MOTRIN) 200 MG tablet Take 400 mg by mouth 2 (two) times daily as needed for mild pain.    . metoprolol succinate (TOPROL-XL) 50 MG 24 hr tablet Take 1 tablet (50 mg total) by mouth daily. Take with or immediately following a meal. 90 tablet 3   No current  facility-administered medications for this visit.     Allergies-reviewed and updated Allergies  Allergen Reactions  . Penicillins Itching    Pt states this may be an error and that she "was given a derivative" of PCN with no reactions at all.    Social History   Social History  . Marital status: Married    Spouse name: N/A  . Number of children: N/A  . Years of education: N/A   Social History Main Topics  . Smoking status: Never Smoker  . Smokeless tobacco: Never Used  . Alcohol use 1.8 oz/week    3 Standard drinks or equivalent per week     Comment: occasional  . Drug use: No  . Sexual activity: Not Asked   Other Topics Concern  . None   Social History Narrative   Married 1980. [name] is husband Marketing executive).3 kids Lavon Paganini. Works at [employer] lives with parents, Wandalee Ferdinand single living with parents works at [employer], Psychologist, counselling works at [employer] and in school in [place] living with parents.       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  HENT: Negative for hearing loss and tinnitus.   Eyes: Negative for blurred vision and double vision.  Respiratory: Negative for cough and shortness of breath.   Cardiovascular: Negative for chest pain and palpitations.  Gastrointestinal: Negative for heartburn and nausea.  Genitourinary: Negative for dysuria and urgency.  Musculoskeletal: Positive for back pain, joint pain, myalgias and neck pain. Negative for falls.  Skin: Negative for itching and rash.  Neurological: Negative for dizziness and headaches.  Endo/Heme/Allergies: Negative for polydipsia. Does not bruise/bleed easily.  Psychiatric/Behavioral: Negative for hallucinations and substance abuse.   Objective: BP 124/76  (BP Location: Left Arm, Patient Position: Sitting, Cuff Size: Large)   Pulse 69   Temp 98.4 F (36.9 C) (Oral)   Ht 5' 10.5" (1.791 m)   Wt 215 lb (97.5 kg)   SpO2 96%   BMI 30.41 kg/m  Gen: NAD, resting comfortably, tall HEENT: Mucous membranes are moist. Oropharynx normal. TM normal. Eyes: sclera and lids normal, PERRLA Neck: no thyromegaly, no cervical lymphadenopathy CV: RRR no murmurs rubs or gallops Lungs: CTAB no crackles, wheeze, rhonchi Abdomen: soft/nontender/nondistended/normal bowel sounds. obese  Ext: no edema Skin: warm, dry Neuro: 5/5 strength in upper and lower extremities, normal gait, normal reflexes  Assessment/Plan:  Hypertension S: controlled on Metoprolol XL 50mg . Also helped with palpitations. Several years. Eats well, exercises regularly per her report. .  BP Readings from Last 3 Encounters:  05/02/16 124/76  03/21/13 121/75  02/28/13 130/86  A/P:Continue current meds:  Filled under my name today   Diffuse pain S:Pretty much all the time a part of her body hurts. Some days feels somewhat dragged down by it but would rather not do pain medication. Eventually calms back down. Mild level of chronic fatigue as well with this. She asks about fibromyalgia though states shw eould not take medicine for this. She walks 10k steps a day which helps her feel better. Would consider yoga or pilates. Does seem to be worse with stress. She has a generalized sensitivity to touch as well.  A/P: we reviewed the [place] [employer] preliminary diagnostic cretiria for fibromyalgia. On the WPI she scored a 7 which could be in diagnostic range 9>7 and SS over 5 or WPI 3-6 and SS >8). She only scored a 4 on symptom severity. We discussed likely in this spectrum and may eventually meet criteria- could readdress again in future. Arthritis likely contributes- sparing ibuprofen also helps  We did discuss trial of yoga or pilates  Family history of colon cancer Brother  with colon cancer. She sees Dr. Dulce Sellar with last colonoscopy 2015- with planned 5 year repeat  6-12 month CPE. May drop off biometric screening from work.    Skin cancer screening- requests derm referral Orders Placed This Encounter  Procedures  . Flu Vaccine QUAD 36+ mos IM  . Tdap vaccine greater than or equal to 7yo IM  . Ambulatory referral to Dermatology    Referral Priority:   Routine    Referral Type:   Consultation    Referral Reason:   Specialty Services Required    Requested Specialty:   Dermatology    Number of Visits Requested:   1    Meds ordered this encounter  Medications  . DISCONTD: metoprolol succinate (TOPROL-XL) 50 MG 24 hr tablet    Sig: Take 50 mg by mouth daily. Take with or immediately following a meal.  . metoprolol succinate (TOPROL-XL) 50 MG 24 hr tablet    Sig: Take 1 tablet (50  mg total) by mouth daily. Take with or immediately following a meal.    Dispense:  90 tablet    Refill:  3   The duration of face-to-face time during this visit was greater than 30 minutes. Greater than 50% of this time was spent in counseling, explanation of diagnosis, planning of further management, and/or coordination of care including about potential fibromyalgia, vaccinations needed, dealing with chronic pain, reasoning behind follow up colonoscopy timing of 5 years.   Declines HCV and HIV testing for now.   Return precautions advised.  Tana ConchStephen Tyreisha Ungar, MD

## 2016-05-02 NOTE — Progress Notes (Signed)
Pre visit review using our clinic review tool, if applicable. No additional management support is needed unless otherwise documented below in the visit note. 

## 2016-05-02 NOTE — Assessment & Plan Note (Addendum)
S:Pretty much all the time a part of her body hurts. Some days feels somewhat dragged down by it but would rather not do pain medication. Eventually calms back down. Mild level of chronic fatigue as well with this. She asks about fibromyalgia though states shw eould not take medicine for this. She walks 10k steps a day which helps her feel better. Would consider yoga or pilates. Does seem to be worse with stress. She has a generalized sensitivity to touch as well.  A/P: we reviewed the [place] [employer] preliminary diagnostic cretiria for fibromyalgia. On the WPI she scored a 7 which could be in diagnostic range 9>7 and SS over 5 or WPI 3-6 and SS >8). She only scored a 4 on symptom severity. We discussed likely in this spectrum and may eventually meet criteria- could readdress again in future. Arthritis likely contributes- sparing ibuprofen also helps  We did discuss trial of yoga or pilates

## 2017-01-30 ENCOUNTER — Ambulatory Visit: Payer: Self-pay

## 2017-01-30 ENCOUNTER — Ambulatory Visit: Payer: Commercial Managed Care - PPO | Admitting: Sports Medicine

## 2017-01-30 ENCOUNTER — Encounter: Payer: Self-pay | Admitting: Sports Medicine

## 2017-01-30 VITALS — BP 130/82 | HR 81 | Ht 71.0 in | Wt 223.6 lb

## 2017-01-30 DIAGNOSIS — M25512 Pain in left shoulder: Secondary | ICD-10-CM | POA: Diagnosis not present

## 2017-01-30 DIAGNOSIS — M7502 Adhesive capsulitis of left shoulder: Secondary | ICD-10-CM | POA: Diagnosis not present

## 2017-01-30 MED ORDER — AMITRIPTYLINE HCL 25 MG PO TABS: 25.0000 mg | ORAL_TABLET | Freq: Every day | ORAL | 3 refills | Status: DC

## 2017-01-30 MED ORDER — DIPHENHYDRAMINE HCL 12.5 MG/5ML PO ELIX
25.0000 mg | ORAL_SOLUTION | Freq: Once | ORAL | Status: AC
  Administered 2017-01-30: 25 mg via ORAL

## 2017-01-30 NOTE — Patient Instructions (Signed)

## 2017-01-30 NOTE — Progress Notes (Signed)
OFFICE VISIT NOTE Nicole Campos D. Nicole Shinerigby, DO  [facility] [place] [phone]  Nicole Campos - 56 y.Campos. female MRN [id number]  Date of birth: [birth year]  Visit Date: 01/30/2017  PCP: Nicole Campos, Nicole O, MD   Referred by: Nicole Campos, Nicole O, MD  Nicole Campos PT, LAT, ATC acting as scribe for Nicole Campos.  SUBJECTIVE:   Chief Complaint  Patient presents with  . New Patient (Initial Visit)    L shoulder pain   HPI: As below and per problem based documentation when appropriate.  Nicole Campos is a new pt presenting today w/ c/Campos L shoulder pain.  She was referred to Nicole Campos by Nicole Campos w/ [facility].  Pt states that she was diagnosed w/ frozen shoulder and has recently had an MRI.  She states that she fell and hit the edge of a storage bin in Feb. 2018.  She states that this happened in the middle of the night, went back to bed and then woke up and her whole L upper arm was bruised.  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  She reports that the pain seems to have transitioned from her neck to her L shoulder.  Pt states that she saw Nicole Campos this morning and had a pizo treatment and notes L ant, post shoulder pain as well as pain in a strip in her L upper arm in the same exact location where she initially hit her arm.  Pt states that her chiropractic care has increased her L shoulder ROM but doesn't really note a change in her pain.  Pt states that her pain is most severe at night and causes her to have difficulty falling an staying asleep.  Pt has tried all the OTC pain meds/anti-inflammatories and has tried Tramadol and doesn't feel like any of those help her pain.  Pt states that her L shoulder and L upper arm pain are a 5-6/10 and she describes the pain as a "twingy, ache."  She also notes that the pain increases to a 10/10 at it's worst.      Review  of Systems  Constitutional: Negative for chills, fever and weight loss.  HENT: Negative.   Eyes: Negative.   Respiratory: Negative for cough, shortness of breath and wheezing.   Cardiovascular: Negative for chest pain and palpitations.  Gastrointestinal: Negative for abdominal pain, heartburn, nausea and vomiting.  Musculoskeletal: Positive for joint pain and neck pain. Negative for falls.  Neurological: Positive for tingling. Negative for dizziness and headaches.  Endo/Heme/Allergies: Does not bruise/bleed easily.  Psychiatric/Behavioral: Negative for depression. The patient has insomnia. The patient is not nervous/anxious.     Otherwise per HPI.   HISTORY & PERTINENT PRIOR DATA:  Prior History reviewed and updated per electronic medical record. Significant history, findings, studies and interim changes include: No additional findings.  reports that  has never smoked. she has never used smokeless tobacco. No results for input(s): HGBA1C, LABURIC, CREATINE in the last 8760 hours. No problems updated.  OBJECTIVE:  VS:  HT:5\' 11"  (180.3 cm)   WT:223 lb 9.6 oz (101.4 kg)  BMI:31.2    BP:130/82  HR:81bpm  TEMP: ( )  RESP:98 %  PHYSICAL EXAM: Constitutional: WDWN, Non-toxic appearing. Psychiatric: Alert & appropriately interactive. Not depressed or anxious appearing. Respiratory: No increased work of breathing. Trachea Midline Eyes: Pupils are equal. EOM intact without  nystagmus. No scleral icterus  UPPER EXTREMITIES No clubbing or cyanosis appreciated Capillary Refill is normal, less than 2 seconds No signficant upper extremity generalized edema Radial Pulses: Normal and symmetrically palpable Sensation in UE dermatomes: intact to light touch   Left Shoulder Exam: Normal alignment & Contours Skin: No overlying erythema/ecchymosis Neck: normal range of motion and supple Axial loading and circumduction produces: No pain or crepitation  Non tender over: Bony Landmarks TTP  over: AC joint  Drop arm test: negative Hawkins: positive, mild pain  Neers: positive, moderate pain   Internal Rotation: Significantly limited Limited arc, strength is intact External Rotation: Significantly limited arc. Strength: normal  Empty can: normal, no pain Strength: normal Speeds: normal, no pain Strength: normal Campos'Briens: normal, no pain Strength: normal  ASSESSMENT & PLAN:   1. Left shoulder pain, unspecified chronicity   2. Adhesive capsulitis of left shoulder    Plan: Large-volume intra-articular injection performed today and will plan to repeat this again in 6 weeks pending progress.  Also start her on amitriptyline given night pain.  Continue with therapeutic exercises previously prescribed  No problem-specific Assessment & Plan notes found for this encounter.   ++++++++++++++++++++++++++++++++++++++++++++ Orders:  Orders Placed This Encounter  Procedures  . US GUIDED NEEDLE PLACEMENT(NO LINKED CHARGES)    Meds:  Meds ordered this encounter  Medications  . diphenhydrAMINE (BENADRYL) 12.5 MG/5ML elixir 25 mg  . amitriptyline (ELAVIL) 25 MG tablet    Sig: Take 1-2 tablets (25-50 mg total) by mouth at bedtime.    Dispense:  60 tablet    Refill:  3    ++++++++++++++++++++++++++++++++++++++++++++ Follow-up: Return in about 6 weeks (around 03/13/2017).   Pertinent documentation may be included in additional procedure notes, imaging studies, problem based documentation and patient instructions. Please see these sections of the encounter for additional information regarding this visit. CMA/ATC served as Neurosurgeonscribe during this visit. History, Physical, and Plan performed by medical provider. Documentation and orders reviewed and attested to.      Andrena MewsMichael D Rigby, DO     Sports Medicine Physician

## 2017-02-13 ENCOUNTER — Encounter: Payer: Self-pay | Admitting: Sports Medicine

## 2017-02-13 NOTE — Procedures (Signed)
PROCEDURE NOTE -  ULTRASOUND GUIDEDInjection: Large-volume intra-articular injection, left Images were obtained and interpreted by myself, Gaspar BiddingMichael Donatella Walski, DO  Images have been saved and stored to PACS system. Images obtained on: GE S7 Ultrasound machine  ULTRASOUND FINDINGS:  Tight capsule, rotator cuff is overall intact  DESCRIPTION OF PROCEDURE:  The patient's clinical condition is marked by substantial pain and/or significant functional disability. Other conservative therapy has not provided relief, is contraindicated, or not appropriate. There is a reasonable likelihood that injection will significantly improve the patient's pain and/or functional impairment.  After discussing the risks, benefits and expected outcomes of the injection and all questions were reviewed and answered, the patient wished to undergo the above named procedure. Verbal consent was obtained.  The ultrasound was used to identify the target structure and adjacent neurovascular structures. The skin was then prepped in sterile fashion and the target structure was injected under direct visualization using sterile technique as below:  Left PREP: Alcohol, Ethel Chloride,   APPROACH: direct, stopcock technique INJECTATE: 5cc 1% lidocaine, 2cc 0.5% marcaine, 2cc 40mg /mL Kenalog, 5 cc of normal saline ASPIRATE: N/A DRESSING: Band-Aid    Post procedural instructions including recommending icing and warning signs for infection were reviewed.  This procedure was well tolerated and there were no complications.   IMPRESSION: Succesful US Guided Injection

## 2017-03-16 ENCOUNTER — Ambulatory Visit: Payer: Commercial Managed Care - PPO | Admitting: Sports Medicine

## 2017-04-13 ENCOUNTER — Ambulatory Visit: Payer: Self-pay

## 2017-04-13 ENCOUNTER — Ambulatory Visit: Payer: Commercial Managed Care - PPO | Admitting: Sports Medicine

## 2017-04-13 ENCOUNTER — Encounter: Payer: Self-pay | Admitting: Sports Medicine

## 2017-04-13 ENCOUNTER — Ambulatory Visit: Payer: Commercial Managed Care - PPO | Admitting: Family Medicine

## 2017-04-13 VITALS — BP 140/82 | HR 76 | Ht 71.0 in | Wt 231.2 lb

## 2017-04-13 DIAGNOSIS — M7502 Adhesive capsulitis of left shoulder: Secondary | ICD-10-CM

## 2017-04-13 NOTE — Progress Notes (Signed)
Nicole FellsMichael D. Delorise Shinerigby, DO  [facility] [place] [phone]  Nicole CairoSusan Lynne Campos - 57 y.o. female MRN [id number]  Date of birth: 01-13-1961  Visit Date: 04/13/2017  PCP: Shelva MajesticHunter, Stephen O, MD   Referred by: Shelva MajesticHunter, Stephen O, MD   Scribe for today's visit: non      SUBJECTIVE:  Nicole Campos is here for Follow-up (Left Shoulder pain) .   Nicole PikesSusan is a new pt presenting today w/ c/o L shoulder pain.  She was referred to Dr. Berline Choughigby by Dr. Sherryle Lisodulfo w/ [facility].  Pt states that she was diagnosed w/ frozen shoulder and has recently had an MRI.  She states that she fell and hit the edge of a storage bin in Feb. 2018.  She states that this happened in the middle of the night, went back to bed and then woke up and her whole L upper arm was bruised.  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  She reports that the pain seems to have transitioned from her neck to her L shoulder.  Pt states that she saw Dr. Sherryle Lisodulfo this morning and had a pizo treatment and notes L ant, post shoulder pain as well as pain in a strip in her L upper arm in the same exact location where she initially hit her arm.  Pt states that her chiropractic care has increased her L shoulder ROM but doesn't really note a change in her pain.  Pt states that her pain is most severe at night and causes her to have difficulty falling an staying asleep.  Pt has tried all the OTC pain meds/anti-inflammatories and has tried Tramadol and doesn't feel like any of those help her pain.  Pt states that her L shoulder and L upper arm pain are a 5-6/10 and she describes the pain as a "twingy, ache."  She also notes that the pain increases to a 10/10 at it's worst.    Patient reports that since her last office visit she has had good improvement with the initial injection but has had return of her symptoms especially  over the past several days.  She would like to repeat the injection today.  She has continue with therapeutic exercises.   ROS Reports night time disturbances. Denies fevers, chills, or night sweats. Denies unexplained weight loss. Denies personal history of cancer.   HISTORY & PERTINENT PRIOR DATA:  Prior History reviewed and updated per electronic medical record.  Significant history, findings, studies and interim changes include:  reports that  has never smoked. she has never used smokeless tobacco. No results for input(s): HGBA1C, LABURIC, CREATINE in the last 8760 hours. No specialty comments available. No problems updated.  OBJECTIVE:  VS:  HT:5\' 11"  (180.3 cm)   WT:231 lb 3.2 oz (104.9 kg)  BMI:32.26    BP:140/82  HR:76bpm  TEMP: ( )  RESP:97 %   PHYSICAL EXAM: Constitutional: WDWN, Non-toxic appearing. Psychiatric: Alert & appropriately interactive.  Not depressed or anxious appearing. Respiratory: No increased work of breathing.  Trachea Midline Eyes: Pupils are equal.  EOM intact without nystagmus.  No scleral icterus  NEUROVASCULAR exam: No clubbing or cyanosis appreciated No significant venous stasis changes Capillary Refill: normal, less than 2 seconds   Left shoulder has persistent limitations in her internal and external rotation especially at 30 degrees of abduction.  Overhead motion is significantly limited.  She has no significant pain with axial load and circumduction.  Intrinsic rotator cuff strength does appear to be intact. No additional findings.   ASSESSMENT & PLAN:   1. Adhesive capsulitis of left shoulder    PLAN: Repeat large-volume intra-articular injection today.  We will plan to see her back in 6 weeks and reevaluate her at that time and can consider one additional injection given is been several months since her last one in traditional protocol for adhesive capsulitis is to serial injections 6 weeks apart.  We will need to reevaluate this at  follow-up however.  I would like her to continue working on therapeutic exercises as well as with chiropractic.   ++++++++++++++++++++++++++++++++++++++++++++ Orders & Meds: Orders Placed This Encounter  Procedures  . [place] MSK POCT ULTRASOUND    No orders of the defined types were placed in this encounter.   ++++++++++++++++++++++++++++++++++++++++++++ Follow-up: Return in about 6 weeks (around 05/25/2017).   Pertinent documentation may be included in additional procedure notes, imaging studies, problem based documentation and patient instructions. Please see these sections of the encounter for additional information regarding this visit. CMA/ATC served as Neurosurgeon during this visit. History, Physical, and Plan performed by medical provider. Documentation and orders reviewed and attested to.      Andrena Mews, DO    [place] [facility] Physician

## 2017-04-13 NOTE — Patient Instructions (Signed)

## 2017-04-13 NOTE — Procedures (Signed)
PROCEDURE NOTE:  Ultrasound Guided: Injection: Left shoulder, intra-articular, large-volume Images were obtained and interpreted by myself, Gaspar BiddingMichael Maridee Slape, DO  Images have been saved and stored to PACS system. Images obtained on: GE S7 Ultrasound machine  ULTRASOUND FINDINGS:  Slight bulging of the posterior labrum.  DESCRIPTION OF PROCEDURE:  The patient's clinical condition is marked by substantial pain and/or significant functional disability. Other conservative therapy has not provided relief, is contraindicated, or not appropriate. There is a reasonable likelihood that injection will significantly improve the patient's pain and/or functional impairment.  After discussing the risks, benefits and expected outcomes of the injection and all questions were reviewed and answered, the patient wished to undergo the above named procedure. Verbal consent was obtained.  The ultrasound was used to identify the target structure and adjacent neurovascular structures. The skin was then prepped in sterile fashion and the target structure was injected under direct visualization using sterile technique as below:  PREP: Alcohol, Ethel Chloride,  APPROACH: Posterior, direct injection, stopcock technique, 22g 3.5in. INJECTATE: 5 cc 1% lidocaine, 2cc 0.5% marcaine, 2cc 40mg /mL Kenalog, 5 cc normal saline ASPIRATE: None   DRESSING: Band-Aid   Post procedural instructions including recommending icing and warning signs for infection were reviewed.   This procedure was well tolerated and there were no complications.    IMPRESSION: Succesful Ultrasound Guided: Injection

## 2017-04-20 ENCOUNTER — Other Ambulatory Visit: Payer: Self-pay | Admitting: Family Medicine

## 2017-05-16 ENCOUNTER — Encounter: Payer: Self-pay | Admitting: Sports Medicine

## 2017-05-25 ENCOUNTER — Ambulatory Visit: Payer: Commercial Managed Care - PPO | Admitting: Sports Medicine

## 2017-05-25 ENCOUNTER — Encounter: Payer: Self-pay | Admitting: Sports Medicine

## 2017-05-25 VITALS — BP 112/80 | HR 75 | Ht 71.0 in | Wt 226.4 lb

## 2017-05-25 DIAGNOSIS — M7502 Adhesive capsulitis of left shoulder: Secondary | ICD-10-CM | POA: Diagnosis not present

## 2017-05-25 DIAGNOSIS — M25512 Pain in left shoulder: Secondary | ICD-10-CM | POA: Diagnosis not present

## 2017-05-25 NOTE — Progress Notes (Signed)
Nicole Campos. [facility] at [facility] [phone]  Nicole Campos - 57 y.o. female MRN [id number]  Date of birth: December 27, 1960  Visit Date: 05/25/2017  PCP: Shelva Majestic, MD   Referred by: Shelva Majestic, MD   Scribe for today's visit: Stevenson Clinch, CMA     SUBJECTIVE:  Nicole Campos is here for Follow-up (adhesive capsulitis L shoulder)  04/13/17: Nicole Campos is a new pt presenting today w/ c/o L shoulder pain.  She was referred to Dr. Berline Chough by Dr. Sherryle Lis w/ [facility].  Pt states that she was diagnosed w/ frozen shoulder and has recently had an MRI.  She states that she fell and hit the edge of a storage bin in Feb. 2018.  She states that this happened in the middle of the night, went back to bed and then woke up and her whole L upper arm was bruised.  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  She reports that the pain seems to have transitioned from her neck to her L shoulder.Pt states that she saw Dr. Sherryle Lis this morning and had a pizo treatment and notes L ant, post shoulder pain as well as pain in a strip in her L upper arm in the same exact location where she initially hit her arm. Pt states that her chiropractic care has increased her L shoulder ROM but doesn't really note a change in her pain.  Pt states that her pain is most severe at night and causes her to have difficulty falling an staying asleep. Pt has tried all the OTC pain meds/anti-inflammatories and has tried Tramadol and doesn't feel like any of those help her pain. Pt states that her L shoulder and L upper arm pain are a 5-6/10 and she describes the pain as a "twingy, ache."  She also notes that the pain increases to a 10/10 at it's worst.    05/25/17: Compared to the last office visit, her previously described symptoms are improving, every once in a while she will feel  a twinge in her shoulder. She does have flare-up after doing Yoga. When pain is present it comes and goes quickly and is only with certain movements.  Current symptoms are mild & are radiating to the bicep She has been seeing a chiropractor ([facility]) with some relief. She has tried OTC anit-inflammatories prn with some relief.  She had Kenalog injection 04/13/17   ROS Denies night time disturbances. Denies fevers, chills, or night sweats. Denies unexplained weight loss. Denies personal history of cancer. Denies changes in bowel or bladder habits. Denies recent unreported falls. Denies new or worsening dyspnea or wheezing. Denies headaches or dizziness.  Denies numbness, tingling or weakness  In the extremities.  Denies dizziness or presyncopal episodes Denies lower extremity edema    HISTORY & PERTINENT PRIOR DATA:  Prior History reviewed and updated per electronic medical record.  Significant history, findings, studies and interim changes include:  reports that  has never smoked. she has never used smokeless tobacco. No results for input(s): HGBA1C, LABURIC, CREATINE in the last 8760 hours. No specialty comments available. Problem  Adhesive Capsulitis of Left Shoulder   Large-volume intra-articular injection performed on 01/30/2017 and 04/13/2017.        Please see additional documentation for Objective, Assessment and Plan sections. Pertinent additional documentation may be included in corresponding  procedure notes, imaging studies, problem based documentation and patient instructions. Please see these sections of the encounter for additional information regarding this visit.  CMA/ATC served as Neurosurgeonscribe during this visit. History, Physical, and Plan performed by medical provider. Documentation and orders reviewed and attested to.      Andrena MewsMichael D Rigby, DO    [facility]

## 2017-05-25 NOTE — Assessment & Plan Note (Signed)
Reports doing significantly better at this time.  Still has some deficit in her IR/ER arc however marked improvement in her symptoms.  Continue with therapeutic exercises and working on range of motion.  Follow-up in 12 weeks to ensure clinical resolution.  If any worsening further diagnostic evaluation can be pursued and she will call us if needed.

## 2017-05-25 NOTE — Progress Notes (Signed)
  OBJECTIVE:  VS:  HT:5\' 11"  (180.3 cm)   WT:226 lb 6.4 oz (102.7 kg)  BMI:31.59    BP:112/80  HR:75bpm  TEMP: ( )  RESP:95 %   PHYSICAL EXAM: Constitutional: WDWN, Non-toxic appearing. Psychiatric: Alert & appropriately interactive.  Not depressed or anxious appearing. Respiratory: No increased work of breathing.  Trachea Midline Eyes: Pupils are equal.  EOM intact without nystagmus.  No scleral icterus  NEUROVASCULAR exam: No clubbing or cyanosis appreciated No significant venous stasis changes Capillary Refill: normal, less than 2 seconds   left shoulder arc 60 Overhead range of motion slightly limited on the left with underlying scapular dyskinesis and limited glenohumeral abduction. Internal rotation, external rotation, empty can, speeds and O'Brien's testing strength is intact but slightly diminished on the left compared to the right diffusely.  Minimal pain with empty can testing although this did seem to be the biggest discrepancy between left and right.  ASSESSMENT & PLAN:   1. Adhesive capsulitis of left shoulder   2. Left shoulder pain, unspecified chronicity    Orders & Meds: No orders of the defined types were placed in this encounter.  No orders of the defined types were placed in this encounter.   PLAN:  Doing well. Marked improvements but still limited ROM Continue TherEx and if any lack of improvement could consider repeat inj but anticipate she will do well Adhesive capsulitis of left shoulder Reports doing significantly better at this time.  Still has some deficit in her IR/ER arc however marked improvement in her symptoms.  Continue with therapeutic exercises and working on range of motion.  Follow-up in 12 weeks to ensure clinical resolution.  If any worsening further diagnostic evaluation can be pursued and she will call us if needed.   Follow-up: Return in about 12 weeks (around 08/17/2017).

## 2017-05-28 ENCOUNTER — Encounter: Payer: Self-pay | Admitting: Family Medicine

## 2017-05-28 ENCOUNTER — Ambulatory Visit: Payer: Commercial Managed Care - PPO | Admitting: Family Medicine

## 2017-05-28 VITALS — BP 120/80 | HR 83 | Temp 98.6°F | Wt 227.4 lb

## 2017-05-28 DIAGNOSIS — R42 Dizziness and giddiness: Secondary | ICD-10-CM | POA: Insufficient documentation

## 2017-05-28 DIAGNOSIS — I10 Essential (primary) hypertension: Secondary | ICD-10-CM

## 2017-05-28 DIAGNOSIS — R002 Palpitations: Secondary | ICD-10-CM | POA: Diagnosis not present

## 2017-05-28 DIAGNOSIS — R3129 Other microscopic hematuria: Secondary | ICD-10-CM

## 2017-05-28 DIAGNOSIS — Z1159 Encounter for screening for other viral diseases: Secondary | ICD-10-CM | POA: Diagnosis not present

## 2017-05-28 DIAGNOSIS — Z13220 Encounter for screening for lipoid disorders: Secondary | ICD-10-CM | POA: Diagnosis not present

## 2017-05-28 DIAGNOSIS — Z114 Encounter for screening for human immunodeficiency virus [HIV]: Secondary | ICD-10-CM | POA: Diagnosis not present

## 2017-05-28 LAB — URINALYSIS, ROUTINE W REFLEX MICROSCOPIC
Bilirubin Urine: NEGATIVE
Ketones, ur: NEGATIVE
Nitrite: NEGATIVE
Specific Gravity, Urine: >=1.03 — AB (ref 1.000–1.030)
Total Protein, Urine: NEGATIVE
Urine Glucose: NEGATIVE
Urobilinogen, UA: 0.2 (ref 0.0–1.0)
pH: 5.5 (ref 5.0–8.0)

## 2017-05-28 LAB — CBC WITH DIFFERENTIAL/PLATELET
Basophils Absolute: 0 10*3/uL (ref 0.0–0.1)
Basophils Relative: 0.5 % (ref 0.0–3.0)
Eosinophils Absolute: 0.1 10*3/uL (ref 0.0–0.7)
Eosinophils Relative: 2.5 % (ref 0.0–5.0)
HCT: 41.2 % (ref 36.0–46.0)
Hemoglobin: 14.3 g/dL (ref 12.0–15.0)
Lymphocytes Relative: 23 % (ref 12.0–46.0)
Lymphs Abs: 1.1 10*3/uL (ref 0.7–4.0)
MCHC: 34.8 g/dL (ref 30.0–36.0)
MCV: 95 fl (ref 78.0–100.0)
Monocytes Absolute: 0.4 10*3/uL (ref 0.1–1.0)
Monocytes Relative: 8.3 % (ref 3.0–12.0)
Neutro Abs: 3.3 10*3/uL (ref 1.4–7.7)
Neutrophils Relative %: 65.7 % (ref 43.0–77.0)
Platelets: 242 10*3/uL (ref 150.0–400.0)
RBC: 4.33 Mil/uL (ref 3.87–5.11)
RDW: 12.6 % (ref 11.5–15.5)
WBC: 5 10*3/uL (ref 4.0–10.5)

## 2017-05-28 LAB — LIPID PANEL
Cholesterol: 200 mg/dL (ref 0–200)
HDL: 67.7 mg/dL (ref >39.00)
LDL Cholesterol: 113 mg/dL — ABNORMAL HIGH (ref 0–99)
NonHDL: 131.83
Total CHOL/HDL Ratio: 3
Triglycerides: 92 mg/dL (ref 0.0–149.0)
VLDL: 18.4 mg/dL (ref 0.0–40.0)

## 2017-05-28 LAB — COMPREHENSIVE METABOLIC PANEL
ALT: 13 U/L (ref 0–35)
AST: 12 U/L (ref 0–37)
Albumin: 4.3 g/dL (ref 3.5–5.2)
Alkaline Phosphatase: 61 U/L (ref 39–117)
BUN: 18 mg/dL (ref 6–23)
CO2: 25 mEq/L (ref 19–32)
Calcium: 9.8 mg/dL (ref 8.4–10.5)
Chloride: 107 mEq/L (ref 96–112)
Creatinine, Ser: 0.68 mg/dL (ref 0.40–1.20)
GFR: 94.1 mL/min (ref >60.00)
Glucose, Bld: 100 mg/dL — ABNORMAL HIGH (ref 70–99)
Potassium: 3.8 mEq/L (ref 3.5–5.1)
Sodium: 141 mEq/L (ref 135–145)
Total Bilirubin: 0.5 mg/dL (ref 0.2–1.2)
Total Protein: 6.9 g/dL (ref 6.0–8.3)

## 2017-05-28 LAB — TSH: TSH: 1.67 u[IU]/mL (ref 0.35–4.50)

## 2017-05-28 MED ORDER — METOPROLOL SUCCINATE ER 50 MG PO TB24: ORAL_TABLET | ORAL | 1 refills | Status: DC

## 2017-05-28 NOTE — Patient Instructions (Addendum)

## 2017-05-28 NOTE — Progress Notes (Signed)
Nicole Campos is a 57 y.o. female is here for follow up.  History of Present Illness:   HPI: See Assessment and Plan section for Problem Based Charting of issues discussed today.  Review of Systems  Constitutional: Negative for chills, diaphoresis, fever, malaise/fatigue and weight loss.  HENT: Negative for hearing loss.   Eyes: Negative for blurred vision.  Respiratory: Negative for cough, shortness of breath and wheezing.   Cardiovascular: Negative for chest pain, palpitations and leg swelling.  Gastrointestinal: Negative for abdominal pain, constipation, diarrhea, heartburn, nausea and vomiting.  Genitourinary: Negative for dysuria, flank pain, frequency, hematuria and urgency.  Musculoskeletal: Negative for joint pain and myalgias.  Skin: Negative for rash.  Neurological: Positive for dizziness. Negative for tingling, tremors, sensory change, speech change, focal weakness, seizures, loss of consciousness, weakness and headaches.  Psychiatric/Behavioral: Negative for depression, substance abuse and suicidal ideas. The patient is not nervous/anxious and does not have insomnia.    Health Maintenance Due  Topic Date Due  . Hepatitis C Screening  [birth year]  . HIV Screening  04/23/1973  . PAP SMEAR  02/24/2017   Depression screen PHQ 2/9 05/28/2017  Decreased Interest 0  Down, Depressed, Hopeless 0  PHQ - 2 Score 0     PMHx, SurgHx, SocialHx, FamHx, Medications, and Allergies were reviewed in the Visit Navigator and updated as appropriate.   Patient Active Problem List   Diagnosis Date Noted  . Adhesive capsulitis of left shoulder 01/30/2017  . Hypertension 05/02/2016  . Diffuse pain 05/02/2016  . Family history of colon cancer 05/02/2016  . Arthritis   . Microscopic hematuria 10/16/2011   Social History   Tobacco Use  . Smoking status: Never Smoker  . Smokeless tobacco: Never Used  Substance Use Topics  . Alcohol use: Yes    Alcohol/week: 1.8 oz    Types: 3  Standard drinks or equivalent per week    Comment: occasional  . Drug use: No   Current Medications and Allergies:   .  amitriptyline (ELAVIL) 25 MG tablet, Take 1-2 tablets (25-50 mg total) by mouth at bedtime., Disp: 60 tablet, Rfl: 3 .  ibuprofen (ADVIL,MOTRIN) 200 MG tablet, Take 400 mg by mouth 2 (two) times daily as needed for mild pain., Disp: , Rfl:  .  metoprolol succinate (TOPROL-XL) 50 MG 24 hr tablet, TAKE 1 TABLET BY MOUTH EVERY DAY WITH OR IMMEDIATELY FOLLOWING A MEAL, Disp: 90 tablet, Rfl: 1   Allergies  Allergen Reactions  . Penicillins Itching    Pt states this may be an error and that she "was given a derivative" of PCN with no reactions at all.   Review of Systems   Pertinent items are noted in the HPI. Otherwise, ROS is negative.  Vitals:   Vitals:   05/28/17 0808  BP: 120/80  Pulse: 83  Temp: 98.6 F (37 C)  TempSrc: Oral  SpO2: 96%  Weight: 227 lb 6.4 oz (103.1 kg)     Body mass index is 31.72 kg/m.  Physical Exam:   Physical Exam  Constitutional: She is oriented to person, place, and time. She appears well-developed and well-nourished. No distress.  HENT:  Head: Normocephalic and atraumatic.  Right Ear: External ear normal.  Left Ear: External ear normal.  Nose: Nose normal.  Mouth/Throat: Oropharynx is clear and moist.  Eyes: Conjunctivae and EOM are normal. Pupils are equal, round, and reactive to light.  Neck: Normal range of motion. Neck supple. No thyromegaly present.  Cardiovascular: Normal rate,  regular rhythm, normal heart sounds and intact distal pulses.  Pulmonary/Chest: Effort normal and breath sounds normal.  Abdominal: Soft. Bowel sounds are normal.  Musculoskeletal: Normal range of motion.  Lymphadenopathy:    She has no cervical adenopathy.  Neurological: She is alert and oriented to person, place, and time.  Skin: Skin is warm and dry. Capillary refill takes less than 2 seconds.  Psychiatric: She has a normal mood and  affect. Her behavior is normal.  Nursing note and vitals reviewed.   Assessment and Plan:   1. Essential hypertension Avoiding excessive salt intake? [x]   YES  []   NO Trying to exercise on a regular basis? [x]   YES  []   NO Review: taking medications as instructed, no medication side effects noted, no TIAs, no chest pain on exertion, no dyspnea on exertion, no swelling of ankles.  Smoker: No.  Wt Readings from Last 3 Encounters:  05/28/17 227 lb 6.4 oz (103.1 kg)  05/25/17 226 lb 6.4 oz (102.7 kg)  04/13/17 231 lb 3.2 oz (104.9 kg)   BP Readings from Last 3 Encounters:  05/28/17 120/80  05/25/17 112/80  04/13/17 140/82   Lab Results  Component Value Date   CREATININE 0.70 02/25/2013   - metoprolol succinate (TOPROL-XL) 50 MG 24 hr tablet; TAKE 1 TABLET BY MOUTH EVERY DAY WITH OR IMMEDIATELY FOLLOWING A MEAL  Dispense: 90 tablet; Refill: 1 - Comprehensive metabolic panel  2. Palpitations New.  Patient complains of nightly tachycardia around midnight.  She wears a fit bit and is able to show this to me on her phone.  It usually gets up to about 140-160 bpm and only for 1-2 minutes.  She does say that she can feel the palpitations every once in a while.  She denies any chest pain or shortness of breath.  No edema.  No exertional symptoms.  She takes her metoprolol in the morning.  She does admit to exacerbated symptoms when she has a high salt meal or alcohol at night before going to bed.  - CBC with Differential/Platelet - TSH  3. Vertigo Ongoing.  New to this provider.  Patient states that anytime she lifts her head up and down, she feels a sensation of imbalance and nausea.  This is been going on for weeks to months.  It lasts for a few minutes.  No trauma.  No upper respiratory or allergy symptoms.  No ear sensations of fullness or tinnitus.  She has been nothing for treatment and she has never had this evaluated in the past.  I did give her information on Epley maneuver.  I did  offer physical therapy.  4. Microscopic hematuria History of the same per chart review.  We will recheck today.  The patient denies ever having this worked up.  - Urinalysis, Routine w reflex microscopic  5. Screening for lipid disorders - Lipid panel  6. Need for hepatitis C screening test - Hepatitis C antibody  7. Screening for HIV (human immunodeficiency virus) - HIV antibody  Labs completed today.  The patient will follow up with her primary care physician for a thorough physical.  . Reviewed expectations re: course of current medical issues. . Discussed self-management of symptoms. . Outlined signs and symptoms indicating need for more acute intervention. . Patient verbalized understanding and all questions were answered. Marland Kitchen. Health Maintenance issues including appropriate healthy diet, exercise, and smoking avoidance were discussed with patient. . See orders for this visit as documented in the electronic medical record. .Marland Kitchen  Patient received an After Visit Summary.   Helane Rima, DO [place] 05/28/2017

## 2017-05-29 LAB — HEPATITIS C ANTIBODY
Hepatitis C Ab: NONREACTIVE
SIGNAL TO CUT-OFF: 0.01 (ref <1.00)

## 2017-05-29 LAB — HIV ANTIBODY (ROUTINE TESTING W REFLEX): HIV 1&2 Ab, 4th Generation: NONREACTIVE

## 2017-07-12 LAB — RESULTS CONSOLE HPV: CHL HPV: NEGATIVE

## 2017-07-12 LAB — HM PAP SMEAR: HM Pap smear: NEGATIVE

## 2017-07-19 ENCOUNTER — Ambulatory Visit: Payer: Commercial Managed Care - PPO | Admitting: Family Medicine

## 2017-07-19 ENCOUNTER — Encounter: Payer: Self-pay | Admitting: Family Medicine

## 2017-07-19 VITALS — BP 124/86 | HR 68 | Temp 97.9°F | Ht 71.0 in | Wt 228.0 lb

## 2017-07-19 DIAGNOSIS — I10 Essential (primary) hypertension: Secondary | ICD-10-CM | POA: Diagnosis not present

## 2017-07-19 DIAGNOSIS — R52 Pain, unspecified: Secondary | ICD-10-CM | POA: Diagnosis not present

## 2017-07-19 DIAGNOSIS — E785 Hyperlipidemia, unspecified: Secondary | ICD-10-CM | POA: Diagnosis not present

## 2017-07-19 DIAGNOSIS — R002 Palpitations: Secondary | ICD-10-CM | POA: Diagnosis not present

## 2017-07-19 DIAGNOSIS — R739 Hyperglycemia, unspecified: Secondary | ICD-10-CM | POA: Insufficient documentation

## 2017-07-19 MED ORDER — METOPROLOL SUCCINATE ER 50 MG PO TB24: ORAL_TABLET | ORAL | 3 refills | Status: DC

## 2017-07-19 NOTE — Patient Instructions (Addendum)
Health Maintenance Due  Topic Date Due  . PAP SMEAR- Patient had it done last week. Our team will call and get the records. 02/24/2017  . MAMMOGRAM- Scheduled for tomorrow. 06/29/2017   Continue to consider yoga or pilates  I like your long term goal of 200 lbs. No rush on this- slow and steady progress toward it. Lets set a goal of at least 10 lbs over the next year

## 2017-07-19 NOTE — Assessment & Plan Note (Signed)
S: Mild poorly controlled on no statin.  Her 10-year risk per ASCVD risk calculator is only 3% though  Lab Results  Component Value Date   CHOL 200 05/28/2017   HDL 67.70 05/28/2017   LDLCALC 113 (H) 05/28/2017   TRIG 92.0 05/28/2017   CHOLHDL 3 05/28/2017   A/P: continue without medicine- continue to work on healthy eating regular exercise

## 2017-07-19 NOTE — Assessment & Plan Note (Signed)
S: controlled on metoprolol 50 mg extended release.  This also helps her with palpitations.  Last visit she admitted to some nighttime palpitations and Fitbit saying heart rate up to 140 or 160 bpm-CBC and TSH at that time are reassuring.  Often triggered by high salt or alcohol at night before bed. She states she has not had recurrence of these palpitations since last visit.  BP Readings from Last 3 Encounters:  07/19/17 124/86  05/28/17 120/80  05/25/17 112/80  A/P: We discussed blood pressure goal of <140/90. Continue current meds.  In regards to palpitations we discussed Holter monitor if recurrent

## 2017-07-19 NOTE — Progress Notes (Signed)
Subjective:  Nicole Campos is a 57 y.o. year old very pleasant female patient who presents for/with See problem oriented charting ROS- shoulder pain. No edema. No chest pain or shortness of breath.    Past Medical History-  Patient Active Problem List   Diagnosis Date Noted  . Hyperlipidemia 07/19/2017    Priority: Medium  . Hyperglycemia 07/19/2017    Priority: Medium  . Palpitations 05/28/2017    Priority: Medium  . Hypertension 05/02/2016    Priority: Medium  . Diffuse pain 05/02/2016    Priority: Medium  . Arthritis     Priority: Medium  . Adhesive capsulitis of left shoulder 01/30/2017    Priority: Low  . Family history of colon cancer 05/02/2016    Priority: Low  . Microscopic hematuria 10/16/2011    Priority: Low  . Vertigo 05/28/2017    Medications- reviewed and updated Current Outpatient Medications  Medication Sig Dispense Refill  . ibuprofen (ADVIL,MOTRIN) 200 MG tablet Take 400 mg by mouth 2 (two) times daily as needed for mild pain.    . metoprolol succinate (TOPROL-XL) 50 MG 24 hr tablet TAKE 1 TABLET BY MOUTH EVERY DAY WITH OR IMMEDIATELY FOLLOWING A MEAL 91 tablet 3   No current facility-administered medications for this visit.     Objective: BP 124/86 (BP Location: Left Arm, Patient Position: Sitting, Cuff Size: Normal)   Pulse 68   Temp 97.9 F (36.6 C) (Oral)   Ht  (1.803 m)   Wt 228 lb (103.4 kg)   SpO2 98%   BMI 31.80 kg/m  Gen: NAD, resting comfortably CV: RRR no murmurs rubs or gallops Lungs: CTAB no crackles, wheeze, rhonchi Abdomen: soft/nontender/nondistended/normal bowel sounds.  Ext: no edema Skin: warm, dry Neuro: normal gait and speech  Assessment/Plan:  Other notes: 1.  Referred to dermatology last visit for skin cancer screening. She sees them again in 2019.  2.  Also has had some vertigo and was given Epley maneuvers.  She declined physical therapy last visit with Dr. Earlene Plater. Patient has not had recurrence. She  has not done the epley maneuvers. Sounds like she may have some orthostatic hypotension symptoms with bending over as well.   Hypertension S: controlled on metoprolol 50 mg extended release.  This also helps her with palpitations.  Last visit she admitted to some nighttime palpitations and Fitbit saying heart rate up to 140 or 160 bpm-CBC and TSH at that time are reassuring.  Often triggered by high salt or alcohol at night before bed. She states she has not had recurrence of these palpitations since last visit.  BP Readings from Last 3 Encounters:  07/19/17 124/86  05/28/17 120/80  05/25/17 112/80  A/P: We discussed blood pressure goal of <140/90. Continue current meds.  In regards to palpitations we discussed Holter monitor if recurrent      Palpitations See hypertension section  Hyperlipidemia S: Mild poorly controlled on no statin.  Her 10-year risk per ASCVD risk calculator is only 3% though  Lab Results  Component Value Date   CHOL 200 05/28/2017   HDL 67.70 05/28/2017   LDLCALC 113 (H) 05/28/2017   TRIG 92.0 05/28/2017   CHOLHDL 3 05/28/2017   A/P: continue without medicine- continue to work on healthy eating regular exercise  Hyperglycemia cbg 100 in 2019. Had been told 10-15 years ago that sugar was very high. She says she felt the best when she was around 170-175. She set a goal to get back under 200. She  prefers to focus on how she feels and how clothes fit.    Diffuse pain S: Patient continues to deal with diffuse pain.  We reviewed the [employer] of rheumatology diagnostic criteria for fibromyalgia last visit.  We also discussed a trial of yoga or Pilates- has not started this yet.  She also ended up seeing Dr. Berline Chough of sports medicine for left shoulder pain and diagnosed with adhesive capsulitis and treated with steroid injection  From last visit "On the WPI she scored a 7 which could be in diagnostic range 9>7 and SS over 5 or WPI 3-6 and SS >8). She only scored  a 4 on symptom severity. We discussed likely in this spectrum and may eventually meet criteria- could readdress again in future. Arthritis likely contributes- sparing ibuprofen also helps".  She is seeing a chiropractor- going to deep tissue massage. Feels like "whole body inflamed" at certain times and particularly with certain foods. She also deals with some bloating- has cut down on bubbly water.  A/P: continue prn nsaids. Continue to follow up with Dr. Berline Chough for the shoulder  Future Appointments  Date Time Provider Department Center  08/17/2017  8:20 AM Andrena Mews, DO [facility]  07/23/2018  8:15 AM Shelva Majestic, MD [facility]   Return in about 1 year (around 07/20/2018) for physical.  Lab/Order associations: Essential hypertension - Plan: metoprolol succinate (TOPROL-XL) 50 MG 24 hr tablet, DISCONTINUED: metoprolol succinate (TOPROL-XL) 50 MG 24 hr tablet  Meds ordered this encounter  Medications  . DISCONTD: metoprolol succinate (TOPROL-XL) 50 MG 24 hr tablet    Sig: TAKE 1 TABLET BY MOUTH EVERY DAY WITH OR IMMEDIATELY FOLLOWING A MEAL    Dispense:  1 tablet    Refill:  3  . metoprolol succinate (TOPROL-XL) 50 MG 24 hr tablet    Sig: TAKE 1 TABLET BY MOUTH EVERY DAY WITH OR IMMEDIATELY FOLLOWING A MEAL    Dispense:  91 tablet    Refill:  3   Return precautions advised.  Tana Conch, MD

## 2017-07-19 NOTE — Assessment & Plan Note (Signed)
cbg 100 in 2019. Had been told 10-15 years ago that sugar was very high. She says she felt the best when she was around 170-175. She set a goal to get back under 200. She prefers to focus on how she feels and how clothes fit.

## 2017-07-19 NOTE — Assessment & Plan Note (Signed)
S: Patient continues to deal with diffuse pain.  We reviewed the [employer] of rheumatology diagnostic criteria for fibromyalgia last visit.  We also discussed a trial of yoga or Pilates- has not started this yet.  She also ended up seeing Dr. Berline Chough of sports medicine for left shoulder pain and diagnosed with adhesive capsulitis and treated with steroid injection  From last visit "On the WPI she scored a 7 which could be in diagnostic range 9>7 and SS over 5 or WPI 3-6 and SS >8). She only scored a 4 on symptom severity. We discussed likely in this spectrum and may eventually meet criteria- could readdress again in future. Arthritis likely contributes- sparing ibuprofen also helps".  She is seeing a chiropractor- going to deep tissue massage. Feels like "whole body inflamed" at certain times and particularly with certain foods. She also deals with some bloating- has cut down on bubbly water.  A/P: continue prn nsaids. Continue to follow up with Dr. Berline Chough for the shoulder

## 2017-07-19 NOTE — Assessment & Plan Note (Signed)
-   See hypertension section.

## 2017-07-20 LAB — HM MAMMOGRAPHY

## 2017-07-24 ENCOUNTER — Encounter: Payer: Self-pay | Admitting: Family Medicine

## 2017-08-07 ENCOUNTER — Encounter: Payer: Self-pay | Admitting: Family Medicine

## 2017-08-17 ENCOUNTER — Encounter: Payer: Self-pay | Admitting: Sports Medicine

## 2017-08-17 ENCOUNTER — Ambulatory Visit: Payer: Commercial Managed Care - PPO | Admitting: Sports Medicine

## 2017-08-17 VITALS — BP 128/80 | HR 76 | Ht 71.0 in | Wt 228.0 lb

## 2017-08-17 DIAGNOSIS — M25512 Pain in left shoulder: Secondary | ICD-10-CM

## 2017-08-17 DIAGNOSIS — M7502 Adhesive capsulitis of left shoulder: Secondary | ICD-10-CM | POA: Diagnosis not present

## 2017-08-17 MED ORDER — AMITRIPTYLINE HCL 25 MG PO TABS: 25.0000 mg | ORAL_TABLET | Freq: Every day | ORAL | 3 refills | Status: DC

## 2017-08-17 NOTE — Progress Notes (Signed)
Veverly FellsMichael D. Delorise Shinerigby, DO  [facility] [phone]  Basilio CairoSusan Lynne Butters - 57 y.o. female MRN [id number]  Date of birth: 03-15-1960  Visit Date: 08/17/2017  PCP: Shelva MajesticHunter, Stephen O, MD   Referred by: Shelva MajesticHunter, Stephen O, MD  Scribe for today's visit: Christoper FabianMolly Weber, LAT, ATC     SUBJECTIVE:  Basilio CairoSusan Lynne Douse is here for Follow-up (L shoulder adhesive capsulitis) .   04/13/17: [name] is a new pt presenting today w/ c/o L shoulder pain.  She was referred to Dr. Berline Choughigby by Dr. Sherryle Lisodulfo w/ [facility].  Pt states that she was diagnosed w/ frozen shoulder and has recently had an MRI.  She states that she fell and hit the edge of a storage bin in Feb. 2018.  She states that this happened in the middle of the night, went back to bed and then woke up and her whole L upper arm was bruised.  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  She reports that the pain seems to have transitioned from her neck to her L shoulder.Pt states that she saw Dr. Sherryle Lisodulfo this morning and had a pizo treatment and notes L ant, post shoulder pain as well as pain in a strip in her L upper arm in the same exact location where she initially hit her arm. Pt states that her chiropractic care has increased her L shoulder ROM but doesn't really note a change in her pain.  Pt states that her pain is most severe at night and causes her to have difficulty falling an staying asleep. Pt has tried all the OTC pain meds/anti-inflammatories and has tried Tramadol and doesn't feel like any of those help her pain. Pt states that her L shoulder and L upper arm pain are a 5-6/10 and she describes the pain as a "twingy, ache."  She also notes that the pain increases to a 10/10 at it's worst.    05/25/17: Compared to the last office visit, her previously described symptoms are improving, every once in a while she will  feel a twinge in her shoulder. She does have flare-up after doing Yoga. When pain is present it comes and goes quickly and is only with certain movements.  Current symptoms are mild & are radiating to the bicep She has been seeing a chiropractor ([facility]) with some relief. She has tried OTC anit-inflammatories prn with some relief.  She had Kenalog injection 04/13/17  08/17/17: Compared to the last office visit on 05/25/17, her previously described L shoulder symptoms are worsening over the past few days.  She states that she attempted to reach behind her to pick something up when she had a twinge of sharp pain.  Her most limited motion is functional IR behind the back.  She will be resuming yoga 1x/week. Current symptoms are 3/10 & are radiating to the L upper arm w/ certain movements which is rare. She has been going to see a chiropractor and takes OTC anti-inflammatories prn w/ some relief.  ROS Reports night time disturbances.  Occasionally due to sleeping on her L side. Denies fevers, chills, or night sweats. Denies unexplained weight loss. Denies personal history of cancer. Denies changes in bowel or bladder habits. Denies recent unreported falls. Denies new or worsening dyspnea or wheezing. Denies headaches or dizziness.  Denies numbness, tingling or weakness  In the extremities.  Denies dizziness or presyncopal episodes Denies lower extremity edema    HISTORY & PERTINENT PRIOR DATA:  Prior History reviewed and updated per electronic medical record.  Significant/pertinent history, findings, studies include:  reports that she has never smoked. She has never used smokeless tobacco. No results for input(s): HGBA1C, LABURIC, CREATINE in the last 8760 hours. The 10-year ASCVD risk score Denman George DC Montez Hageman., et al., 2013) is: 3%   Values used to calculate the score:     Age: 61 years     Sex: Female     Is Non-Hispanic African American: No     Diabetic: No     Tobacco smoker: No      Systolic Blood Pressure: 120 mmHg     Is BP treated: Yes     HDL Cholesterol: 67.7 mg/dL     Total Cholesterol: 200 mg/dL No problems updated.  OBJECTIVE:  VS:  HT:5\' 11"  (180.3 cm)   WT:228 lb (103.4 kg)  BMI:31.81    BP:128/80  HR:76bpm  TEMP: ( )  RESP:95 %   PHYSICAL EXAM: Constitutional: WDWN, Non-toxic appearing. Psychiatric: Alert & appropriately interactive.  Not depressed or anxious appearing. Respiratory: No increased work of breathing.  Trachea Midline Eyes: Pupils are equal.  EOM intact without nystagmus.  No scleral icterus  Vascular Exam: warm to touch no edema  upper extremity neuro exam: unremarkable normal strength normal sensation normal reflexes  MSK Exam: She still has limited arc with the left shoulder held at 30 degrees of abduction.  Her intrinsic rotator cuff strength is markedly improved.  She is able to get to L3 with internal rotation at this time which is markedly improved however still can scapular dyskinesis and TTP within the axillary recess.   ASSESSMENT & PLAN:   1. Adhesive capsulitis of left shoulder   2. Left shoulder pain, unspecified chronicity     PLAN: Discussed multiple options with her today including addition of Elavil for nighttime improvement she is continued to have some discomfort at night.  Formal physical therapy as well to have passive range of motion and soft tissue mobilization.  8-week follow-up if any lack of improvement can consider further diagnostic evaluation/reevaluation MSK ultrasound versus MRI arthrogram.  >50% of this 25 minute visit spent in direct patient counseling and/or coordination of care.  Discussion was focused on education regarding the in discussing the pathoetiology and anticipated clinical course of the above condition.  Follow-up: Return in about 8 weeks (around 10/12/2017).      Please see additional documentation for Objective, Assessment and Plan sections. Pertinent additional documentation  may be included in corresponding procedure notes, imaging studies, problem based documentation and patient instructions. Please see these sections of the encounter for additional information regarding this visit.  CMA/ATC served as Neurosurgeon during this visit. History, Physical, and Plan performed by medical provider. Documentation and orders reviewed and attested to.      Andrena Mews, DO    [facility]

## 2017-09-04 ENCOUNTER — Encounter: Payer: Self-pay | Admitting: Sports Medicine

## 2017-09-05 ENCOUNTER — Telehealth: Payer: Self-pay | Admitting: Family Medicine

## 2017-09-05 NOTE — Telephone Encounter (Signed)
Copied from CRM [id number]. Topic: Quick Communication - See Telephone Encounter >> Sep 05, 2017  9:24 AM Burchel, Abbi R wrote: See Telephone encounter for: 09/05/17.  Pt called to inquire about a referral for PT/Dry needling with Sedalia MutaLauren Carroll as discussed in 08/17/17 OV w/Dr Durene CalHunter.  She was never contacted to schedule, and would like to f/up before she sees Dr Durene CalHunter again on 10/11/17.  Please call pt to schedule/advise.  Pt Callback #:  [phone]

## 2017-09-05 NOTE — Telephone Encounter (Signed)
Patient has been scheduled

## 2017-09-11 ENCOUNTER — Encounter: Payer: Self-pay | Admitting: Physical Therapy

## 2017-09-11 ENCOUNTER — Ambulatory Visit (INDEPENDENT_AMBULATORY_CARE_PROVIDER_SITE_OTHER): Payer: Commercial Managed Care - PPO | Admitting: Physical Therapy

## 2017-09-11 DIAGNOSIS — M25612 Stiffness of left shoulder, not elsewhere classified: Secondary | ICD-10-CM | POA: Diagnosis not present

## 2017-09-11 DIAGNOSIS — M25512 Pain in left shoulder: Secondary | ICD-10-CM | POA: Diagnosis not present

## 2017-09-11 NOTE — Therapy (Signed)
[facility] [address] [place], [place], [address] Phone: [phone]   Fax:  [phone]  Physical Therapy Evaluation  Patient Details  Name: Nicole Campos MRN: [id number] Date of Birth: 30-Oct-1960 Referring Provider: Gaspar Bidding   Encounter Date: 09/11/2017  PT End of Session - 09/11/17 1317    Visit Number  1    Number of Visits  12    Date for PT Re-Evaluation  10/23/17    Authorization Type  UHC/UMR    PT Start Time  1215    PT Stop Time  1255    PT Time Calculation (min)  40 min    Activity Tolerance  Patient tolerated treatment well    Behavior During Therapy  [facility] for tasks assessed/performed       Past Medical History:  Diagnosis Date  . Arthritis    cervical, lumbar, knees, feet. mild hands  . Headache(784.0)    Hx: of occasinal Migraine  . Heart palpitations    Hx: of  . Hypertension   . PONV (postoperative nausea and vomiting)     Past Surgical History:  Procedure Laterality Date  . APPENDECTOMY    . CESAREAN SECTION  1990  . CHOLECYSTECTOMY N/A 02/28/2013   Procedure: LAPAROSCOPIC CHOLECYSTECTOMY;  Surgeon: Axel Filler, MD;  Location: [facility];  Service: General;  Laterality: N/A;  . COLONOSCOPY W/ POLYPECTOMY     Hx: of  . KNEE SURGERY     Left knee - meniscus tear  . LAPAROSCOPIC APPENDECTOMY N/A 08/05/2012   Procedure: APPENDECTOMY LAPAROSCOPIC;  Surgeon: Clovis Pu. Cornett, MD;  Location: [facility];  Service: General;  Laterality: N/A;  . wisdom teeth      There were no vitals filed for this visit.   Subjective Assessment - 09/11/17 1220    Subjective  Pt states increased pain in L shoulder, for several months. She had 2nd cortisone injection recently, which has helped significantly. Was dx with frozen shoulder, feels like she is starting to get mobility back, but still feels limited with activities. 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     Limitations  Lifting;House hold activities    Patient  Stated Goals  Improve pain, mobility    Currently in Pain?  Yes    Pain Score  5     Pain Location  Shoulder    Pain Orientation  Left    Pain Descriptors / Indicators  Aching    Pain Type  Acute pain    Pain Onset  More than a month ago    Pain Frequency  Intermittent    Aggravating Factors   Elevation, use, sleeping    Pain Relieving Factors  none         [facility] PT Assessment - 09/11/17 0001      Assessment   Medical Diagnosis  L shoulder pain    Referring Provider  Gaspar Bidding    Hand Dominance  Right    Prior Therapy  yes      Precautions   Precautions  None      Balance Screen   Has the patient fallen in the past 6 months  --    How many times?  1    Has the patient had a decrease in activity level because of a fear of falling?   No    Is the patient reluctant to leave their home because of a fear of falling?   No      Prior  Function   Level of Independence  Independent      Cognition   Overall Cognitive Status  Within Functional Limits for tasks assessed      ROM / Strength   AROM / PROM / Strength  AROM;PROM;Strength      AROM   AROM Assessment Site  Shoulder    Right/Left Shoulder  Left    Left Shoulder Flexion  120 Degrees    Left Shoulder ABduction  110 Degrees    Left Shoulder Internal Rotation  60 Degrees    Left Shoulder External Rotation  50 Degrees      PROM   PROM Assessment Site  Shoulder    Right/Left Shoulder  Left    Left Shoulder Flexion  140 Degrees    Left Shoulder ABduction  140 Degrees    Left Shoulder Internal Rotation  60 Degrees    Left Shoulder External Rotation  50 Degrees      Strength   Strength Assessment Site  Shoulder    Right/Left Shoulder  Left    Left Shoulder Flexion  4-/5    Left Shoulder ABduction  4-/5    Left Shoulder Internal Rotation  4-/5    Left Shoulder External Rotation  4-/5      Palpation   Palpation comment  Hypomobile L GHJ                Objective measurements completed on  examination: See above findings.      [facility] Adult PT Treatment/Exercise - 09/11/17 0001      Exercises   Exercises  Shoulder      Shoulder Exercises: Supine   Flexion  AAROM;15 reps    Other Supine Exercises  ER butterfly stretch 5 sec x10      Shoulder Exercises: Seated   Retraction  10 reps      Shoulder Exercises: Stretch   Corner Stretch  3 reps;30 seconds      Manual Therapy   Manual Therapy  Joint mobilization;Passive ROM    Joint Mobilization  Grade 2 GHJ mobs all directions    Passive ROM  L GHJ all motions             PT Education - 09/11/17 1317    Education Details  HEP, PT POC    Person(s) Educated  Patient    Methods  Explanation;Verbal cues;Handout    Comprehension  Verbalized understanding;Need further instruction;Tactile cues required;Verbal cues required       PT Short Term Goals - 09/11/17 1324      PT SHORT TERM GOAL #1   Title  Pt to be independent with initial HEP     Time  2    Period  Weeks    Status  New    Target Date  09/25/17        PT Long Term Goals - 09/11/17 1324      PT LONG TERM GOAL #1   Title  Pt to demo improved L shoulder AROM to be WNL, to improve ability for ADLS and reaching.     Time  6    Period  Weeks    Status  New    Target Date  10/23/17      PT LONG TERM GOAL #2   Title  Pt to demo improved L shoulder Strength to at least 4+/5 to improve ability for lifting, carrying    Time  6    Period  Weeks    Target Date  10/23/17      PT LONG TERM GOAL #3   Title  Pt to report decreased pain in L shoulder, to 0-2/10 with activity     Time  6    Period  Weeks    Status  New    Target Date  10/23/17      PT LONG TERM GOAL #4   Title  Pt to be independent with final HEP for shoulder mobility and strengthening.     Time  6    Period  Weeks    Status  New    Target Date  10/23/17             Plan - 09/11/17 1319    Clinical Impression Statement  Pt presents with primary complaint of increased pain  in L shoulder. Pt with joint stiffness that is consistent wtih adhesive capsulitis, causing decreased ROM. She has decreased strength in L UE, and decreased ability for full functional use, lifting, carrying, ADLS, and work duties. Pt with lack of effective HEP for her diagnosis. Pt to benefit from skilled PT to improve deficits and return to PLOF wihtout pain.     Clinical Presentation  Stable    Clinical Decision Making  Low    Rehab Potential  Good    PT Frequency  2x / week    PT Duration  6 weeks    PT Treatment/Interventions  ADLs/Self Care Home Management;Cryotherapy;Electrical Stimulation;Iontophoresis 4mg /ml Dexamethasone;Moist Heat;Therapeutic activities;Ultrasound;Therapeutic exercise;Neuromuscular re-education;Patient/family education;Dry needling;Passive range of motion;Manual techniques;Taping    Consulted and Agree with Plan of Care  Patient       Patient will benefit from skilled therapeutic intervention in order to improve the following deficits and impairments:  Hypomobility, Decreased activity tolerance, Decreased strength, Pain, Impaired UE functional use, Increased muscle spasms, Decreased mobility, Decreased range of motion, Improper body mechanics, Impaired flexibility  Visit Diagnosis: Acute pain of left shoulder  Stiffness of left shoulder, not elsewhere classified     Problem List Patient Active Problem List   Diagnosis Date Noted  . Hyperlipidemia 07/19/2017  . Hyperglycemia 07/19/2017  . Palpitations 05/28/2017  . Vertigo 05/28/2017  . Adhesive capsulitis of left shoulder 01/30/2017  . Hypertension 05/02/2016  . Diffuse pain 05/02/2016  . Family history of colon cancer 05/02/2016  . Arthritis   . Microscopic hematuria 10/16/2011    Sedalia Muta, PT, DPT 1:34 PM  09/11/17    [facility] [address] [place], [place], [address] Phone: [phone]   Fax:  [phone]  Name: Nicole Campos MRN:  [id number] Date of Birth: [birth year]

## 2017-09-11 NOTE — Patient Instructions (Signed)
Access Code: [email or web address]  URL: [email or web address]  Date: 09/11/2017  Prepared by: Sedalia MutaLauren Bach Rocchi   Exercises  Supine Shoulder Flexion Extension AAROM with Dowel - 10 reps - 2 sets - 2x daily  Supine Shoulder Flexion AAROM - 10 reps - 2 sets - 2x daily  Supine Shoulder External Rotation in 45 Degrees Abduction AAROM with Dowel - 3 reps - 30 hold - 2x daily  Doorway Pec Stretch at 60 Degrees Abduction - 3 reps - 30 hold - 2x daily  Seated Scapular Retraction - 10 reps - 2 sets - 3x daily  Supine Chest Stretch with Elbows Bent - 10 reps - 2 sets - 5 hold - 2x daily  Seated Shoulder Flexion Towel Slide at Table Top - 10 reps - 2 sets - 2x daily

## 2017-09-11 NOTE — Addendum Note (Signed)
Addended by: Sedalia MutaARROLL, Bryar Rennie on: 09/11/2017 01:37 PM   Modules accepted: Orders

## 2017-09-18 ENCOUNTER — Ambulatory Visit: Payer: Commercial Managed Care - PPO | Admitting: Physical Therapy

## 2017-09-18 ENCOUNTER — Encounter: Payer: Self-pay | Admitting: Physical Therapy

## 2017-09-18 DIAGNOSIS — M25512 Pain in left shoulder: Secondary | ICD-10-CM | POA: Diagnosis not present

## 2017-09-18 DIAGNOSIS — M25612 Stiffness of left shoulder, not elsewhere classified: Secondary | ICD-10-CM

## 2017-09-18 NOTE — Therapy (Signed)
[facility] [address] [place], [place], [address] Phone: [phone]   Fax:  [phone]  Physical Therapy Treatment  Patient Details  Name: Nicole Campos MRN: [id number] Date of Birth: [birth year] Referring Provider: Gaspar Bidding   Encounter Date: 09/18/2017  PT End of Session - 09/18/17 1349    Visit Number  2    Number of Visits  12    Date for PT Re-Evaluation  10/23/17    Authorization Type  UHC/UMR    PT Start Time  1220    PT Stop Time  1315    PT Time Calculation (min)  55 min    Activity Tolerance  Patient tolerated treatment well    Behavior During Therapy  [facility] for tasks assessed/performed       Past Medical History:  Diagnosis Date  . Arthritis    cervical, lumbar, knees, feet. mild hands  . Headache(784.0)    Hx: of occasinal Migraine  . Heart palpitations    Hx: of  . Hypertension   . PONV (postoperative nausea and vomiting)     Past Surgical History:  Procedure Laterality Date  . APPENDECTOMY    . CESAREAN SECTION  1990  . CHOLECYSTECTOMY N/A 02/28/2013   Procedure: LAPAROSCOPIC CHOLECYSTECTOMY;  Surgeon: Axel Filler, MD;  Location: [facility];  Service: General;  Laterality: N/A;  . COLONOSCOPY W/ POLYPECTOMY     Hx: of  . KNEE SURGERY     Left knee - meniscus tear  . LAPAROSCOPIC APPENDECTOMY N/A 08/05/2012   Procedure: APPENDECTOMY LAPAROSCOPIC;  Surgeon: Clovis Pu. Cornett, MD;  Location: [facility];  Service: General;  Laterality: N/A;  . wisdom teeth      There were no vitals filed for this visit.  Subjective Assessment - 09/18/17 1348    Subjective  Pt has questions about HEP, states increased pain in shoulder with exercises, but also states that arm is moving easier when she was swimming this week.     Currently in Pain?  Yes    Pain Score  5     Pain Location  Shoulder    Pain Orientation  Left    Pain Descriptors / Indicators  Aching    Pain Type  Acute pain    Pain Onset  More than a  month ago    Pain Frequency  Intermittent                       [facility] Adult PT Treatment/Exercise - 09/18/17 1228      Exercises   Exercises  Shoulder      Shoulder Exercises: Supine   Flexion  AAROM;20 reps    Flexion Limitations  cane    Other Supine Exercises  ER butterfly stretch 5 sec x10    Other Supine Exercises  Chest press x20, cane;       Shoulder Exercises: Seated   Retraction  --      Shoulder Exercises: Standing   Row  20 reps    Theraband Level (Shoulder Row)  Level 2 (Red)    Other Standing Exercises  Wall slides 1 UE x20;     Other Standing Exercises  IR behind back 2 hand hold x15;       Shoulder Exercises: Pulleys   Flexion  2 minutes      Shoulder Exercises: Stretch   Corner Stretch  3 reps;30 seconds      Modalities   Modalities  Cryotherapy  Cryotherapy   Number Minutes Cryotherapy  10 Minutes    Cryotherapy Location  Shoulder    Type of Cryotherapy  Ice pack      Manual Therapy   Manual Therapy  Joint mobilization;Passive ROM    Joint Mobilization  Grade 2 & 3  GHJ mobs all directions    Passive ROM  L GHJ all motions               PT Short Term Goals - 09/11/17 1324      PT SHORT TERM GOAL #1   Title  Pt to be independent with initial HEP     Time  2    Period  Weeks    Status  New    Target Date  09/25/17        PT Long Term Goals - 09/11/17 1324      PT LONG TERM GOAL #1   Title  Pt to demo improved L shoulder AROM to be WNL, to improve ability for ADLS and reaching.     Time  6    Period  Weeks    Status  New    Target Date  10/23/17      PT LONG TERM GOAL #2   Title  Pt to demo improved L shoulder Strength to at least 4+/5 to improve ability for lifting, carrying    Time  6    Period  Weeks    Target Date  10/23/17      PT LONG TERM GOAL #3   Title  Pt to report decreased pain in L shoulder, to 0-2/10 with activity     Time  6    Period  Weeks    Status  New    Target Date  10/23/17       PT LONG TERM GOAL #4   Title  Pt to be independent with final HEP for shoulder mobility and strengthening.     Time  6    Period  Weeks    Status  New    Target Date  10/23/17            Plan - 09/18/17 1350    Clinical Impression Statement  Pt with soreness at end of available range for flexion. She was educated on performing HEP with low grade stretching to tolerance, and not causing too much pain. All exercises for HEP reviewed today for proper mechanics and motion. Pt with mild ROM improvements since last week. Plan to progress ROM and strength as pain allows.     Rehab Potential  Good    PT Frequency  2x / week    PT Duration  6 weeks    PT Treatment/Interventions  ADLs/Self Care Home Management;Cryotherapy;Electrical Stimulation;Iontophoresis 4mg /ml Dexamethasone;Moist Heat;Therapeutic activities;Ultrasound;Therapeutic exercise;Neuromuscular re-education;Patient/family education;Dry needling;Passive range of motion;Manual techniques;Taping    Consulted and Agree with Plan of Care  Patient       Patient will benefit from skilled therapeutic intervention in order to improve the following deficits and impairments:  Hypomobility, Decreased activity tolerance, Decreased strength, Pain, Impaired UE functional use, Increased muscle spasms, Decreased mobility, Decreased range of motion, Improper body mechanics, Impaired flexibility  Visit Diagnosis: Acute pain of left shoulder  Stiffness of left shoulder, not elsewhere classified     Problem List Patient Active Problem List   Diagnosis Date Noted  . Hyperlipidemia 07/19/2017  . Hyperglycemia 07/19/2017  . Palpitations 05/28/2017  . Vertigo 05/28/2017  . Adhesive capsulitis of left shoulder 01/30/2017  .  Hypertension 05/02/2016  . Diffuse pain 05/02/2016  . Family history of colon cancer 05/02/2016  . Arthritis   . Microscopic hematuria 10/16/2011   Sedalia Muta, PT, DPT 1:52 PM  09/18/17    [facility] [address] [place], [place], [address] Phone: [phone]   Fax:  [phone]  Name: Nicole Campos MRN: [id number] Date of Birth: Feb 01, 1961

## 2017-09-20 ENCOUNTER — Encounter: Payer: Self-pay | Admitting: Physical Therapy

## 2017-09-20 ENCOUNTER — Ambulatory Visit: Payer: Commercial Managed Care - PPO | Admitting: Physical Therapy

## 2017-09-20 DIAGNOSIS — M25612 Stiffness of left shoulder, not elsewhere classified: Secondary | ICD-10-CM

## 2017-09-20 DIAGNOSIS — M25512 Pain in left shoulder: Secondary | ICD-10-CM

## 2017-09-20 NOTE — Therapy (Signed)
[facility] [address] [place], [place], [address] Phone: [phone]   Fax:  [phone]  Physical Therapy Treatment  Patient Details  Name: Nicole CairoSusan Lynne Campos MRN: [id number] Date of Birth: 13-Oct-1960 Referring Provider: Gaspar BiddingMichael Rigby   Encounter Date: 09/20/2017  PT End of Session - 09/20/17 1418    Visit Number  3    Number of Visits  12    Date for PT Re-Evaluation  10/23/17    Authorization Type  UHC/UMR    PT Start Time  1220    PT Stop Time  1310    PT Time Calculation (min)  50 min    Activity Tolerance  Patient tolerated treatment well    Behavior During Therapy  [facility] for tasks assessed/performed       Past Medical History:  Diagnosis Date  . Arthritis    cervical, lumbar, knees, feet. mild hands  . Headache(784.0)    Hx: of occasinal Migraine  . Heart palpitations    Hx: of  . Hypertension   . PONV (postoperative nausea and vomiting)     Past Surgical History:  Procedure Laterality Date  . APPENDECTOMY    . CESAREAN SECTION  1990  . CHOLECYSTECTOMY N/A 02/28/2013   Procedure: LAPAROSCOPIC CHOLECYSTECTOMY;  Surgeon: Axel FillerArmando Ramirez, MD;  Location: [facility];  Service: General;  Laterality: N/A;  . COLONOSCOPY W/ POLYPECTOMY     Hx: of  . KNEE SURGERY     Left knee - meniscus tear  . LAPAROSCOPIC APPENDECTOMY N/A 08/05/2012   Procedure: APPENDECTOMY LAPAROSCOPIC;  Surgeon: Clovis Puhomas A. Cornett, MD;  Location: [facility];  Service: General;  Laterality: N/A;  . wisdom teeth      There were no vitals filed for this visit.  Subjective Assessment - 09/20/17 1418    Subjective  Pt states increased pain at night after last session, but arm felt very good during the day.     Currently in Pain?  Yes    Pain Score  4     Pain Location  Shoulder    Pain Orientation  Left    Pain Descriptors / Indicators  Aching    Pain Type  Acute pain    Pain Onset  More than a month ago    Pain Frequency  Intermittent                        [facility] Adult PT Treatment/Exercise - 09/20/17 1229      Exercises   Exercises  Shoulder      Shoulder Exercises: Supine   External Rotation  AAROM;20 reps    External Rotation Limitations  cane    Flexion  AAROM;20 reps    Shoulder Flexion Weight (lbs)  1    Flexion Limitations  cane    Other Supine Exercises  --    Other Supine Exercises  --      Shoulder Exercises: Standing   Row  20 reps    Theraband Level (Shoulder Row)  Level 2 (Red)    Other Standing Exercises  Wall slides 1 UE x20;     Other Standing Exercises  IR behind back stretch 30sec x3      Shoulder Exercises: Pulleys   Flexion  2 minutes      Shoulder Exercises: Stretch   Corner Stretch  3 reps;30 seconds      Modalities   Modalities  Cryotherapy      Cryotherapy   Number  Minutes Cryotherapy  10 Minutes    Cryotherapy Location  Shoulder    Type of Cryotherapy  Ice pack      Manual Therapy   Manual Therapy  Joint mobilization;Passive ROM    Joint Mobilization  Grade 2 & 3  GHJ mobs all directions: standing IR mobilization with movement x3;     Passive ROM  L GHJ all motions               PT Short Term Goals - 09/11/17 1324      PT SHORT TERM GOAL #1   Title  Pt to be independent with initial HEP     Time  2    Period  Weeks    Status  New    Target Date  09/25/17        PT Long Term Goals - 09/11/17 1324      PT LONG TERM GOAL #1   Title  Pt to demo improved L shoulder AROM to be WNL, to improve ability for ADLS and reaching.     Time  6    Period  Weeks    Status  New    Target Date  10/23/17      PT LONG TERM GOAL #2   Title  Pt to demo improved L shoulder Strength to at least 4+/5 to improve ability for lifting, carrying    Time  6    Period  Weeks    Target Date  10/23/17      PT LONG TERM GOAL #3   Title  Pt to report decreased pain in L shoulder, to 0-2/10 with activity     Time  6    Period  Weeks    Status  New    Target Date   10/23/17      PT LONG TERM GOAL #4   Title  Pt to be independent with final HEP for shoulder mobility and strengthening.     Time  6    Period  Weeks    Status  New    Target Date  10/23/17            Plan - 09/20/17 1419    Clinical Impression Statement  Pt with soreness and stiffness with end ranges of all motions, but most motions, especially flexion are improving. IR behind the back is still very sore. Pt to benefit from progression of ROM and strength.     Rehab Potential  Good    PT Frequency  2x / week    PT Duration  6 weeks    PT Treatment/Interventions  ADLs/Self Care Home Management;Cryotherapy;Electrical Stimulation;Iontophoresis 4mg /ml Dexamethasone;Moist Heat;Therapeutic activities;Ultrasound;Therapeutic exercise;Neuromuscular re-education;Patient/family education;Dry needling;Passive range of motion;Manual techniques;Taping    Consulted and Agree with Plan of Care  Patient       Patient will benefit from skilled therapeutic intervention in order to improve the following deficits and impairments:  Hypomobility, Decreased activity tolerance, Decreased strength, Pain, Impaired UE functional use, Increased muscle spasms, Decreased mobility, Decreased range of motion, Improper body mechanics, Impaired flexibility  Visit Diagnosis: Acute pain of left shoulder  Stiffness of left shoulder, not elsewhere classified     Problem List Patient Active Problem List   Diagnosis Date Noted  . Hyperlipidemia 07/19/2017  . Hyperglycemia 07/19/2017  . Palpitations 05/28/2017  . Vertigo 05/28/2017  . Adhesive capsulitis of left shoulder 01/30/2017  . Hypertension 05/02/2016  . Diffuse pain 05/02/2016  . Family history of colon cancer 05/02/2016  .  Arthritis   . Microscopic hematuria 10/16/2011    Sedalia Muta, PT, DPT 2:21 PM  09/20/17      [facility] [address] [place], [place], [address] Phone: [phone]   Fax:   [phone]  Name: Nicole Campos MRN: [id number] Date of Birth: 1960-07-14

## 2017-09-24 ENCOUNTER — Encounter: Payer: Self-pay | Admitting: Physical Therapy

## 2017-09-24 ENCOUNTER — Ambulatory Visit: Payer: Commercial Managed Care - PPO | Admitting: Physical Therapy

## 2017-09-24 ENCOUNTER — Other Ambulatory Visit: Payer: Self-pay | Admitting: Family Medicine

## 2017-09-24 DIAGNOSIS — M25512 Pain in left shoulder: Secondary | ICD-10-CM | POA: Diagnosis not present

## 2017-09-24 DIAGNOSIS — M25612 Stiffness of left shoulder, not elsewhere classified: Secondary | ICD-10-CM

## 2017-09-25 NOTE — Therapy (Signed)
[facility] [address] [place], [place], [address] Phone: [phone]   Fax:  [phone]  Physical Therapy Treatment  Patient Details  Name: Nicole Campos MRN: [id number] Date of Birth: 10-17-1960 Referring Provider: Gaspar Bidding   Encounter Date: 09/24/2017  PT End of Session - 09/24/17 1220    Visit Number  4    Number of Visits  12    Date for PT Re-Evaluation  10/23/17    Authorization Type  UHC/UMR    PT Start Time  1214    PT Stop Time  1309    PT Time Calculation (min)  55 min    Activity Tolerance  Patient tolerated treatment well    Behavior During Therapy  [facility] for tasks assessed/performed       Past Medical History:  Diagnosis Date  . Arthritis    cervical, lumbar, knees, feet. mild hands  . Headache(784.0)    Hx: of occasinal Migraine  . Heart palpitations    Hx: of  . Hypertension   . PONV (postoperative nausea and vomiting)     Past Surgical History:  Procedure Laterality Date  . APPENDECTOMY    . CESAREAN SECTION  1990  . CHOLECYSTECTOMY N/A 02/28/2013   Procedure: LAPAROSCOPIC CHOLECYSTECTOMY;  Surgeon: Axel Filler, MD;  Location: [facility];  Service: General;  Laterality: N/A;  . COLONOSCOPY W/ POLYPECTOMY     Hx: of  . KNEE SURGERY     Left knee - meniscus tear  . LAPAROSCOPIC APPENDECTOMY N/A 08/05/2012   Procedure: APPENDECTOMY LAPAROSCOPIC;  Surgeon: Clovis Pu. Cornett, MD;  Location: [facility];  Service: General;  Laterality: N/A;  . wisdom teeth      There were no vitals filed for this visit.  Subjective Assessment - 09/24/17 1219    Subjective  Pt states less soreness after last visit. She did lots of shoulder work in the pool over the weekened.     Currently in Pain?  Yes    Pain Score  2     Pain Orientation  Left    Pain Descriptors / Indicators  Aching    Pain Type  Acute pain    Pain Onset  More than a month ago    Pain Frequency  Intermittent                        [facility] Adult PT Treatment/Exercise - 09/24/17 1224      Exercises   Exercises  Shoulder      Shoulder Exercises: Supine   External Rotation  20 reps;AROM    External Rotation Limitations  at 90 deg/ 1lb    Flexion  20 reps;AROM    Shoulder Flexion Weight (lbs)  1    Flexion Limitations  --      Shoulder Exercises: Standing   External Rotation  20 reps;Theraband    Theraband Level (Shoulder External Rotation)  Level 2 (Red)    Internal Rotation  20 reps;Theraband    Theraband Level (Shoulder Internal Rotation)  Level 2 (Red)    Flexion  10 reps    Flexion Limitations  to 90 deg/ scaption;     Row  20 reps    Theraband Level (Shoulder Row)  Level 3 (Green)    Other Standing Exercises  Wall slides 1 UE x20; Stall stretch 30 sec x3;     Other Standing Exercises  IR behind back stretch x10 ,2 UE  Shoulder Exercises: Pulleys   Flexion  2 minutes    ABduction  2 minutes      Shoulder Exercises: Stretch   Corner Stretch  3 reps;30 seconds      Modalities   Modalities  Cryotherapy      Cryotherapy   Number Minutes Cryotherapy  10 Minutes    Cryotherapy Location  Shoulder    Type of Cryotherapy  Ice pack      Manual Therapy   Manual Therapy  Joint mobilization;Passive ROM    Joint Mobilization  Grade 2 & 3  GHJ mobs all directions: standing IR mobilization with movement x3;     Passive ROM  L GHJ all motions               PT Short Term Goals - 09/11/17 1324      PT SHORT TERM GOAL #1   Title  Pt to be independent with initial HEP     Time  2    Period  Weeks    Status  New    Target Date  09/25/17        PT Long Term Goals - 09/11/17 1324      PT LONG TERM GOAL #1   Title  Pt to demo improved L shoulder AROM to be WNL, to improve ability for ADLS and reaching.     Time  6    Period  Weeks    Status  New    Target Date  10/23/17      PT LONG TERM GOAL #2   Title  Pt to demo improved L shoulder Strength to at  least 4+/5 to improve ability for lifting, carrying    Time  6    Period  Weeks    Target Date  10/23/17      PT LONG TERM GOAL #3   Title  Pt to report decreased pain in L shoulder, to 0-2/10 with activity     Time  6    Period  Weeks    Status  New    Target Date  10/23/17      PT LONG TERM GOAL #4   Title  Pt to be independent with final HEP for shoulder mobility and strengthening.     Time  6    Period  Weeks    Status  New    Target Date  10/23/17            Plan - 09/25/17 0935    Clinical Impression Statement  Pt showing improvements with ease of AROM, and decreased pain. She has improving PROM, but still has stiffness and soreness at end ranges. Pt benefitting from manual therapy and ther ex, plan to progress as tolerated.     Rehab Potential  Good    PT Frequency  2x / week    PT Duration  6 weeks    PT Treatment/Interventions  ADLs/Self Care Home Management;Cryotherapy;Electrical Stimulation;Iontophoresis 4mg /ml Dexamethasone;Moist Heat;Therapeutic activities;Ultrasound;Therapeutic exercise;Neuromuscular re-education;Patient/family education;Dry needling;Passive range of motion;Manual techniques;Taping    Consulted and Agree with Plan of Care  Patient       Patient will benefit from skilled therapeutic intervention in order to improve the following deficits and impairments:  Hypomobility, Decreased activity tolerance, Decreased strength, Pain, Impaired UE functional use, Increased muscle spasms, Decreased mobility, Decreased range of motion, Improper body mechanics, Impaired flexibility  Visit Diagnosis: Acute pain of left shoulder  Stiffness of left shoulder, not elsewhere classified  Problem List Patient Active Problem List   Diagnosis Date Noted  . Hyperlipidemia 07/19/2017  . Hyperglycemia 07/19/2017  . Palpitations 05/28/2017  . Vertigo 05/28/2017  . Adhesive capsulitis of left shoulder 01/30/2017  . Hypertension 05/02/2016  . Diffuse pain  05/02/2016  . Family history of colon cancer 05/02/2016  . Arthritis   . Microscopic hematuria 10/16/2011    Sedalia Muta, PT, DPT 9:47 AM  09/25/17    [facility] [address] [place], [place], [address] Phone: [phone]   Fax:  [phone]  Name: Nicole Campos MRN: [id number] Date of Birth: 08-18-1960

## 2017-09-27 ENCOUNTER — Encounter: Payer: Commercial Managed Care - PPO | Admitting: Physical Therapy

## 2017-10-01 ENCOUNTER — Encounter: Payer: Self-pay | Admitting: Physical Therapy

## 2017-10-01 ENCOUNTER — Ambulatory Visit: Payer: Commercial Managed Care - PPO | Admitting: Physical Therapy

## 2017-10-01 DIAGNOSIS — M25512 Pain in left shoulder: Secondary | ICD-10-CM | POA: Diagnosis not present

## 2017-10-01 DIAGNOSIS — M25612 Stiffness of left shoulder, not elsewhere classified: Secondary | ICD-10-CM

## 2017-10-01 NOTE — Therapy (Signed)
[facility] [address] [place], [place], [address] Phone: [phone]   Fax:  [phone]  Physical Therapy Treatment  Patient Details  Name: Nicole Campos MRN: [id number] Date of Birth: 1961-01-22 Referring Provider: Gaspar Bidding   Encounter Date: 10/01/2017  PT End of Session - 10/01/17 1218    Visit Number  5    Number of Visits  12    Date for PT Re-Evaluation  10/23/17    Authorization Type  UHC/UMR    PT Start Time  1215    PT Stop Time  1259    PT Time Calculation (min)  44 min    Activity Tolerance  Patient tolerated treatment well    Behavior During Therapy  [facility] for tasks assessed/performed       Past Medical History:  Diagnosis Date  . Arthritis    cervical, lumbar, knees, feet. mild hands  . Headache(784.0)    Hx: of occasinal Migraine  . Heart palpitations    Hx: of  . Hypertension   . PONV (postoperative nausea and vomiting)     Past Surgical History:  Procedure Laterality Date  . APPENDECTOMY    . CESAREAN SECTION  1990  . CHOLECYSTECTOMY N/A 02/28/2013   Procedure: LAPAROSCOPIC CHOLECYSTECTOMY;  Surgeon: Axel Filler, MD;  Location: [facility];  Service: General;  Laterality: N/A;  . COLONOSCOPY W/ POLYPECTOMY     Hx: of  . KNEE SURGERY     Left knee - meniscus tear  . LAPAROSCOPIC APPENDECTOMY N/A 08/05/2012   Procedure: APPENDECTOMY LAPAROSCOPIC;  Surgeon: Clovis Pu. Cornett, MD;  Location: [facility];  Service: General;  Laterality: N/A;  . wisdom teeth      There were no vitals filed for this visit.  Subjective Assessment - 10/01/17 1217    Subjective  Pt states that she feels shoulder is improving. She has much less pain on a daily basis, and improved ability for swimming. Full and repeated AROM still painful.     Currently in Pain?  Yes    Pain Score  3     Pain Location  Shoulder    Pain Orientation  Left    Pain Descriptors / Indicators  Aching    Pain Type  Acute pain    Pain Onset   More than a month ago    Pain Frequency  Intermittent         [facility] PT Assessment - 10/01/17 0001      AROM   Left Shoulder Flexion  145 Degrees    Left Shoulder ABduction  125 Degrees      PROM   Left Shoulder Flexion  155 Degrees    Left Shoulder ABduction  152 Degrees    Left Shoulder Internal Rotation  55 Degrees    Left Shoulder External Rotation  50 Degrees      Strength   Left Shoulder Flexion  4/5    Left Shoulder ABduction  4/5    Left Shoulder Internal Rotation  4+/5    Left Shoulder External Rotation  4/5                   [facility] Adult PT Treatment/Exercise - 10/01/17 1221      Exercises   Exercises  Shoulder      Shoulder Exercises: Supine   External Rotation  --    External Rotation Limitations  --    Flexion  20 reps;AROM    Shoulder Flexion Weight (lbs)  1      Shoulder Exercises: Standing   External Rotation  20 reps;Theraband    Theraband Level (Shoulder External Rotation)  Level 3 (Green)    Internal Rotation  20 reps;Theraband    Theraband Level (Shoulder Internal Rotation)  Level 3 (Green)    Flexion  10 reps    Flexion Limitations  --    Row  20 reps    Theraband Level (Shoulder Row)  Level 3 (Green)    Other Standing Exercises  Wall slides 1 UE x20; Stall stretch 30 sec x3;     Other Standing Exercises  IR behind back stretch x10 ,2 UE      Shoulder Exercises: Pulleys   Flexion  2 minutes    ABduction  --      Shoulder Exercises: Stretch   Corner Stretch  3 reps;30 seconds      Modalities   Modalities  Cryotherapy      Cryotherapy   Cryotherapy Location  Shoulder      Manual Therapy   Manual Therapy  Joint mobilization;Passive ROM    Joint Mobilization  Grade 2 & 3  GHJ mobs all directions: standing IR mobilization with movement x3;     Passive ROM  L GHJ all motions               PT Short Term Goals - 09/11/17 1324      PT SHORT TERM GOAL #1   Title  Pt to be independent with initial HEP     Time  2     Period  Weeks    Status  New    Target Date  09/25/17        PT Long Term Goals - 09/11/17 1324      PT LONG TERM GOAL #1   Title  Pt to demo improved L shoulder AROM to be WNL, to improve ability for ADLS and reaching.     Time  6    Period  Weeks    Status  New    Target Date  10/23/17      PT LONG TERM GOAL #2   Title  Pt to demo improved L shoulder Strength to at least 4+/5 to improve ability for lifting, carrying    Time  6    Period  Weeks    Target Date  10/23/17      PT LONG TERM GOAL #3   Title  Pt to report decreased pain in L shoulder, to 0-2/10 with activity     Time  6    Period  Weeks    Status  New    Target Date  10/23/17      PT LONG TERM GOAL #4   Title  Pt to be independent with final HEP for shoulder mobility and strengthening.     Time  6    Period  Weeks    Status  New    Target Date  10/23/17            Plan - 10/01/17 1551    Clinical Impression Statement  Pt with improving PROM, up to 155 deg today, and improved AROM as well. She continues to have soreness with repeated elevation for AROM. Pt progressing well, will decrease to 1x/wk for next 2 weeks. Pt to benefit from continued care for improving ROM and strength.     Rehab Potential  Good    PT Frequency  2x / week  PT Duration  6 weeks    PT Treatment/Interventions  ADLs/Self Care Home Management;Cryotherapy;Electrical Stimulation;Iontophoresis 4mg /ml Dexamethasone;Moist Heat;Therapeutic activities;Ultrasound;Therapeutic exercise;Neuromuscular re-education;Patient/family education;Dry needling;Passive range of motion;Manual techniques;Taping    Consulted and Agree with Plan of Care  Patient       Patient will benefit from skilled therapeutic intervention in order to improve the following deficits and impairments:  Hypomobility, Decreased activity tolerance, Decreased strength, Pain, Impaired UE functional use, Increased muscle spasms, Decreased mobility, Decreased range of motion,  Improper body mechanics, Impaired flexibility  Visit Diagnosis: Acute pain of left shoulder  Stiffness of left shoulder, not elsewhere classified     Problem List Patient Active Problem List   Diagnosis Date Noted  . Hyperlipidemia 07/19/2017  . Hyperglycemia 07/19/2017  . Palpitations 05/28/2017  . Vertigo 05/28/2017  . Adhesive capsulitis of left shoulder 01/30/2017  . Hypertension 05/02/2016  . Diffuse pain 05/02/2016  . Family history of colon cancer 05/02/2016  . Arthritis   . Microscopic hematuria 10/16/2011    Sedalia Muta, PT, DPT 3:54 PM  10/01/17    [place]  [facility] [address] [place], [place], [address] Phone: [phone]   Fax:  [phone]  Name: Nicole Campos MRN: [id number] Date of Birth: 01-23-1961

## 2017-10-08 ENCOUNTER — Encounter: Payer: Self-pay | Admitting: Physical Therapy

## 2017-10-08 ENCOUNTER — Encounter: Payer: Commercial Managed Care - PPO | Admitting: Physical Therapy

## 2017-10-08 ENCOUNTER — Ambulatory Visit: Payer: Commercial Managed Care - PPO | Admitting: Physical Therapy

## 2017-10-08 DIAGNOSIS — M25612 Stiffness of left shoulder, not elsewhere classified: Secondary | ICD-10-CM

## 2017-10-08 DIAGNOSIS — M25512 Pain in left shoulder: Secondary | ICD-10-CM | POA: Diagnosis not present

## 2017-10-08 NOTE — Therapy (Deleted)
[facility] [address] , [place], [address] Phone: [phone]   Fax:  [phone]  Physical Therapy Treatment  Patient Details  Name: Nicole CairoSusan Lynne Campos MRN: [id number] Date of Birth: 1960/05/12 Referring Provider: Gaspar BiddingMichael Rigby   Encounter Date: 10/08/2017  PT End of Session - 10/08/17 1645    Visit Number  6    Number of Visits  12    Date for PT Re-Evaluation  10/23/17    Authorization Type  UHC/UMR    PT Start Time  1605    PT Stop Time  1640    PT Time Calculation (min)  35 min    Activity Tolerance  Patient tolerated treatment well    Behavior During Therapy  [facility] for tasks assessed/performed       Past Medical History:  Diagnosis Date  . Arthritis    cervical, lumbar, knees, feet. mild hands  . Headache(784.0)    Hx: of occasinal Migraine  . Heart palpitations    Hx: of  . Hypertension   . PONV (postoperative nausea and vomiting)     Past Surgical History:  Procedure Laterality Date  . APPENDECTOMY    . CESAREAN SECTION  1990  . CHOLECYSTECTOMY N/A 02/28/2013   Procedure: LAPAROSCOPIC CHOLECYSTECTOMY;  Surgeon: Axel FillerArmando Ramirez, MD;  Location: [facility] OR;  Service: General;  Laterality: N/A;  . COLONOSCOPY W/ POLYPECTOMY     Hx: of  . KNEE SURGERY     Left knee - meniscus tear  . LAPAROSCOPIC APPENDECTOMY N/A 08/05/2012   Procedure: APPENDECTOMY LAPAROSCOPIC;  Surgeon: Clovis Puhomas A. Cornett, MD;  Location: [facility];  Service: General;  Laterality: N/A;  . wisdom teeth      There were no vitals filed for this visit.  Subjective Assessment - 10/08/17 1644    Subjective  Pt with some concern that she is still experiencing pain ( at end range of flexion)     Currently in Pain?  Yes    Pain Score  3     Pain Orientation  Left    Pain Descriptors / Indicators  Aching    Pain Type  Acute pain    Pain Onset  More than a month ago    Pain Frequency  Intermittent    Aggravating Factors   End range flexion ,  elevation                       [facility] Adult PT Treatment/Exercise - 10/08/17 1613      Exercises   Exercises  Shoulder      Shoulder Exercises: Supine   External Rotation  20 reps;AROM    External Rotation Limitations  at 90 deg/ 1lb    Flexion  20 reps;AROM    Shoulder Flexion Weight (lbs)  2      Shoulder Exercises: Standing   External Rotation  20 reps;Theraband    Theraband Level (Shoulder External Rotation)  Level 3 (Green)    Internal Rotation  20 reps;Theraband    Theraband Level (Shoulder Internal Rotation)  Level 3 (Green)    Flexion  15 reps    Row  20 reps    Theraband Level (Shoulder Row)  Level 3 (Green)    Other Standing Exercises  --    Other Standing Exercises  --      Shoulder Exercises: Pulleys   Flexion  2 minutes      Shoulder Exercises: Lawyertretch   Corner Stretch  --  Modalities   Modalities  --      Cryotherapy   Cryotherapy Location  --      Manual Therapy   Manual Therapy  Joint mobilization;Passive ROM    Joint Mobilization  Grade 2 & 3  GHJ mobs all directions: standing IR mobilization with movement x3;     Passive ROM  L GHJ all motions               PT Short Term Goals - 09/11/17 1324      PT SHORT TERM GOAL #1   Title  Pt to be independent with initial HEP     Time  2    Period  Weeks    Status  New    Target Date  09/25/17        PT Long Term Goals - 09/11/17 1324      PT LONG TERM GOAL #1   Title  Pt to demo improved L shoulder AROM to be WNL, to improve ability for ADLS and reaching.     Time  6    Period  Weeks    Status  New    Target Date  10/23/17      PT LONG TERM GOAL #2   Title  Pt to demo improved L shoulder Strength to at least 4+/5 to improve ability for lifting, carrying    Time  6    Period  Weeks    Target Date  10/23/17      PT LONG TERM GOAL #3   Title  Pt to report decreased pain in L shoulder, to 0-2/10 with activity     Time  6    Period  Weeks    Status  New     Target Date  10/23/17      PT LONG TERM GOAL #4   Title  Pt to be independent with final HEP for shoulder mobility and strengthening.     Time  6    Period  Weeks    Status  New    Target Date  10/23/17            Plan - 10/08/17 1647    Clinical Impression Statement  Pt continues to show improvements with shoulder AROM and strength. She has near full ROM, but still has pain at end of available range with PROM. Discussed progress that she has made today, with much improved ROM overall, pain at end range likely due to joint stiffness in that position. Pt doing well with frequency of HEP at home, and is showing good improvments, plan to continue 1x/wk.    Rehab Potential  Good    PT Frequency  2x / week    PT Duration  6 weeks    PT Treatment/Interventions  ADLs/Self Care Home Management;Cryotherapy;Electrical Stimulation;Iontophoresis 4mg /ml Dexamethasone;Moist Heat;Therapeutic activities;Ultrasound;Therapeutic exercise;Neuromuscular re-education;Patient/family education;Dry needling;Passive range of motion;Manual techniques;Taping    Consulted and Agree with Plan of Care  Patient       Patient will benefit from skilled therapeutic intervention in order to improve the following deficits and impairments:  Hypomobility, Decreased activity tolerance, Decreased strength, Pain, Impaired UE functional use, Increased muscle spasms, Decreased mobility, Decreased range of motion, Improper body mechanics, Impaired flexibility  Visit Diagnosis: Acute pain of left shoulder  Stiffness of left shoulder, not elsewhere classified     Problem List Patient Active Problem List   Diagnosis Date Noted  . Hyperlipidemia 07/19/2017  . Hyperglycemia 07/19/2017  . Palpitations 05/28/2017  .  Vertigo 05/28/2017  . Adhesive capsulitis of left shoulder 01/30/2017  . Hypertension 05/02/2016  . Diffuse pain 05/02/2016  . Family history of colon cancer 05/02/2016  . Arthritis   . Microscopic hematuria  10/16/2011   Nicole Campos, PT, DPT 4:49 PM  10/08/17    [facility] [address] [place], [place], [address] Phone: [phone]   Fax:  [phone]  Name: Karimah Winquist MRN: [id number] Date of Birth: [birth year]

## 2017-10-08 NOTE — Therapy (Deleted)
[facility] [address] [place], [place], [address] Phone: [phone]   Fax:  [phone]  Physical Therapy Treatment  Patient Details  Name: Nicole CairoSusan Lynne Campos MRN: [id number] Date of Birth: 18-Jul-1960 Referring Provider: Gaspar BiddingMichael Rigby   Encounter Date: 10/08/2017  PT End of Session - 10/08/17 1645    Visit Number  6    Number of Visits  12    Date for PT Re-Evaluation  10/23/17    Authorization Type  UHC/UMR    PT Start Time  1605    PT Stop Time  1640    PT Time Calculation (min)  35 min    Activity Tolerance  Patient tolerated treatment well    Behavior During Therapy  [facility] for tasks assessed/performed       Past Medical History:  Diagnosis Date   Arthritis    cervical, lumbar, knees, feet. mild hands   Headache(784.0)    Hx: of occasinal Migraine   Heart palpitations    Hx: of   Hypertension    PONV (postoperative nausea and vomiting)     Past Surgical History:  Procedure Laterality Date   APPENDECTOMY     CESAREAN SECTION  1990   CHOLECYSTECTOMY N/A 02/28/2013   Procedure: LAPAROSCOPIC CHOLECYSTECTOMY;  Surgeon: Axel FillerArmando Ramirez, MD;  Location: [facility];  Service: General;  Laterality: N/A;   COLONOSCOPY W/ POLYPECTOMY     Hx: of   KNEE SURGERY     Left knee - meniscus tear   LAPAROSCOPIC APPENDECTOMY N/A 08/05/2012   Procedure: APPENDECTOMY LAPAROSCOPIC;  Surgeon: Clovis Puhomas A. Cornett, MD;  Location: [facility];  Service: General;  Laterality: N/A;   wisdom teeth      There were no vitals filed for this visit.  Subjective Assessment - 10/08/17 1644    Subjective  Pt with some concern that she is still experiencing pain ( at end range of flexion)     Currently in Pain?  Yes    Pain Score  3     Pain Orientation  Left    Pain Descriptors / Indicators  Aching    Pain Type  Acute pain    Pain Onset  More than a month ago    Pain Frequency  Intermittent    Aggravating Factors   End range flexion ,  elevation                       [facility] Adult PT Treatment/Exercise - 10/08/17 1613      Exercises   Exercises  Shoulder      Shoulder Exercises: Supine   External Rotation  20 reps;AROM    External Rotation Limitations  at 90 deg/ 1lb    Flexion  20 reps;AROM    Shoulder Flexion Weight (lbs)  2      Shoulder Exercises: Standing   External Rotation  20 reps;Theraband    Theraband Level (Shoulder External Rotation)  Level 3 (Green)    Internal Rotation  20 reps;Theraband    Theraband Level (Shoulder Internal Rotation)  Level 3 (Green)    Flexion  15 reps    Row  20 reps    Theraband Level (Shoulder Row)  Level 3 (Green)    Other Standing Exercises  --    Other Standing Exercises  --      Shoulder Exercises: Pulleys   Flexion  2 minutes      Shoulder Exercises: Lawyertretch   Corner Stretch  --  Modalities   Modalities  --      Cryotherapy   Cryotherapy Location  --      Manual Therapy   Manual Therapy  Joint mobilization;Passive ROM    Joint Mobilization  Grade 2 & 3  GHJ mobs all directions: standing IR mobilization with movement x3;     Passive ROM  L GHJ all motions               PT Short Term Goals - 10/08/17 1656      PT SHORT TERM GOAL #1   Title  Pt to be independent with initial HEP     Time  2    Period  Weeks    Status  Achieved        PT Long Term Goals - 09/11/17 1324      PT LONG TERM GOAL #1   Title  Pt to demo improved L shoulder AROM to be WNL, to improve ability for ADLS and reaching.     Time  6    Period  Weeks    Status  New    Target Date  10/23/17      PT LONG TERM GOAL #2   Title  Pt to demo improved L shoulder Strength to at least 4+/5 to improve ability for lifting, carrying    Time  6    Period  Weeks    Target Date  10/23/17      PT LONG TERM GOAL #3   Title  Pt to report decreased pain in L shoulder, to 0-2/10 with activity     Time  6    Period  Weeks    Status  New    Target Date  10/23/17       PT LONG TERM GOAL #4   Title  Pt to be independent with final HEP for shoulder mobility and strengthening.     Time  6    Period  Weeks    Status  New    Target Date  10/23/17            Plan - 10/08/17 1647    Clinical Impression Statement  Pt continues to show improvements with shoulder AROM and strength. She has near full ROM, but still has pain at end of available range with PROM. Discussed progress that she has made today, with much improved ROM overall, pain at end range likely due to joint stiffness in that position. Pt doing well with frequency of HEP at home, and is showing good improvments, plan to continue 1x/wk.    Rehab Potential  Good    PT Frequency  2x / week    PT Duration  6 weeks    PT Treatment/Interventions  ADLs/Self Care Home Management;Cryotherapy;Electrical Stimulation;Iontophoresis 4mg /ml Dexamethasone;Moist Heat;Therapeutic activities;Ultrasound;Therapeutic exercise;Neuromuscular re-education;Patient/family education;Dry needling;Passive range of motion;Manual techniques;Taping    Consulted and Agree with Plan of Care  Patient       Patient will benefit from skilled therapeutic intervention in order to improve the following deficits and impairments:  Hypomobility, Decreased activity tolerance, Decreased strength, Pain, Impaired UE functional use, Increased muscle spasms, Decreased mobility, Decreased range of motion, Improper body mechanics, Impaired flexibility  Visit Diagnosis: Acute pain of left shoulder  Stiffness of left shoulder, not elsewhere classified     Problem List Patient Active Problem List   Diagnosis Date Noted   Hyperlipidemia 07/19/2017   Hyperglycemia 07/19/2017   Palpitations 05/28/2017   Vertigo 05/28/2017   Adhesive capsulitis  of left shoulder 01/30/2017   Hypertension 05/02/2016   Diffuse pain 05/02/2016   Family history of colon cancer 05/02/2016   Arthritis    Microscopic hematuria 10/16/2011    Sedalia Muta, PT, DPT 4:57 PM  10/08/17   [facility] [address] [place], [place], [address] Phone: [phone]   Fax:  [phone]  Name: Mirren Gest MRN: [id number] Date of Birth: 10-Sep-1960

## 2017-10-09 NOTE — Therapy (Signed)
[facility] [address] , [place], [address] Phone: [phone]   Fax:  [phone]  Physical Therapy Treatment  Patient Details  Name: Nicole Campos MRN: [id number] Date of Birth: 1960/05/12 Referring Provider: Gaspar BiddingMichael Rigby   Encounter Date: 10/08/2017  PT End of Session - 10/08/17 1645    Visit Number  6    Number of Visits  12    Date for PT Re-Evaluation  10/23/17    Authorization Type  UHC/UMR    PT Start Time  1605    PT Stop Time  1640    PT Time Calculation (min)  35 min    Activity Tolerance  Patient tolerated treatment well    Behavior During Therapy  [facility] for tasks assessed/performed       Past Medical History:  Diagnosis Date  . Arthritis    cervical, lumbar, knees, feet. mild hands  . Headache(784.0)    Hx: of occasinal Migraine  . Heart palpitations    Hx: of  . Hypertension   . PONV (postoperative nausea and vomiting)     Past Surgical History:  Procedure Laterality Date  . APPENDECTOMY    . CESAREAN SECTION  1990  . CHOLECYSTECTOMY N/A 02/28/2013   Procedure: LAPAROSCOPIC CHOLECYSTECTOMY;  Surgeon: Axel FillerArmando Ramirez, MD;  Location: [facility];  Service: General;  Laterality: N/A;  . COLONOSCOPY W/ POLYPECTOMY     Hx: of  . KNEE SURGERY     Left knee - meniscus tear  . LAPAROSCOPIC APPENDECTOMY N/A 08/05/2012   Procedure: APPENDECTOMY LAPAROSCOPIC;  Surgeon: Clovis Puhomas A. Cornett, MD;  Location: [facility];  Service: General;  Laterality: N/A;  . wisdom teeth      There were no vitals filed for this visit.  Subjective Assessment - 10/08/17 1644    Subjective  Pt with some concern that she is still experiencing pain ( at end range of flexion)     Currently in Pain?  Yes    Pain Score  3     Pain Orientation  Left    Pain Descriptors / Indicators  Aching    Pain Type  Acute pain    Pain Onset  More than a month ago    Pain Frequency  Intermittent    Aggravating Factors   End range flexion ,  elevation                       [facility] Adult PT Treatment/Exercise - 10/08/17 1613      Exercises   Exercises  Shoulder      Shoulder Exercises: Supine   External Rotation  20 reps;AROM    External Rotation Limitations  at 90 deg/ 1lb    Flexion  20 reps;AROM    Shoulder Flexion Weight (lbs)  2      Shoulder Exercises: Standing   External Rotation  20 reps;Theraband    Theraband Level (Shoulder External Rotation)  Level 3 (Green)    Internal Rotation  20 reps;Theraband    Theraband Level (Shoulder Internal Rotation)  Level 3 (Green)    Flexion  15 reps    Row  20 reps    Theraband Level (Shoulder Row)  Level 3 (Green)    Other Standing Exercises  --    Other Standing Exercises  --      Shoulder Exercises: Pulleys   Flexion  2 minutes      Shoulder Exercises: Lawyertretch   Corner Stretch  --  Modalities   Modalities  --      Cryotherapy   Cryotherapy Location  --      Manual Therapy   Manual Therapy  Joint mobilization;Passive ROM    Joint Mobilization  Grade 2 & 3  GHJ mobs all directions: standing IR mobilization with movement x3;     Passive ROM  L GHJ all motions               PT Short Term Goals - 10/08/17 1656      PT SHORT TERM GOAL #1   Title  Pt to be independent with initial HEP     Time  2    Period  Weeks    Status  Achieved        PT Long Term Goals - 09/11/17 1324      PT LONG TERM GOAL #1   Title  Pt to demo improved L shoulder AROM to be WNL, to improve ability for ADLS and reaching.     Time  6    Period  Weeks    Status  New    Target Date  10/23/17      PT LONG TERM GOAL #2   Title  Pt to demo improved L shoulder Strength to at least 4+/5 to improve ability for lifting, carrying    Time  6    Period  Weeks    Target Date  10/23/17      PT LONG TERM GOAL #3   Title  Pt to report decreased pain in L shoulder, to 0-2/10 with activity     Time  6    Period  Weeks    Status  New    Target Date  10/23/17       PT LONG TERM GOAL #4   Title  Pt to be independent with final HEP for shoulder mobility and strengthening.     Time  6    Period  Weeks    Status  New    Target Date  10/23/17            Plan - 10/08/17 1647    Clinical Impression Statement  Pt continues to show improvements with shoulder AROM and strength. She has near full ROM, but still has pain at end of available range with PROM. Discussed progress that she has made today, with much improved ROM overall, pain at end range likely due to joint stiffness in that position. Pt doing well with frequency of HEP at home, and is showing good improvments, plan to continue 1x/wk.    Rehab Potential  Good    PT Frequency  2x / week    PT Duration  6 weeks    PT Treatment/Interventions  ADLs/Self Care Home Management;Cryotherapy;Electrical Stimulation;Iontophoresis 4mg /ml Dexamethasone;Moist Heat;Therapeutic activities;Ultrasound;Therapeutic exercise;Neuromuscular re-education;Patient/family education;Dry needling;Passive range of motion;Manual techniques;Taping    Consulted and Agree with Plan of Care  Patient       Patient will benefit from skilled therapeutic intervention in order to improve the following deficits and impairments:     Visit Diagnosis: Acute pain of left shoulder     Problem List Patient Active Problem List   Diagnosis Date Noted  . Hyperlipidemia 07/19/2017  . Hyperglycemia 07/19/2017  . Palpitations 05/28/2017  . Vertigo 05/28/2017  . Adhesive capsulitis of left shoulder 01/30/2017  . Hypertension 05/02/2016  . Diffuse pain 05/02/2016  . Family history of colon cancer 05/02/2016  . Arthritis   . Microscopic hematuria 10/16/2011  Sedalia Muta, PT, DPT 9:25 AM  10/09/17    [facility] [address] [place], [place], [address] Phone: [phone]   Fax:  [phone]  Name: Nicole Campos MRN: [id number] Date of Birth: 08-03-1960

## 2017-10-11 ENCOUNTER — Ambulatory Visit: Payer: Commercial Managed Care - PPO | Admitting: Sports Medicine

## 2017-10-11 ENCOUNTER — Encounter: Payer: Self-pay | Admitting: Sports Medicine

## 2017-10-11 VITALS — BP 120/84 | HR 74 | Ht 71.0 in | Wt 220.2 lb

## 2017-10-11 DIAGNOSIS — M9902 Segmental and somatic dysfunction of thoracic region: Secondary | ICD-10-CM

## 2017-10-11 DIAGNOSIS — M25512 Pain in left shoulder: Secondary | ICD-10-CM | POA: Diagnosis not present

## 2017-10-11 DIAGNOSIS — M25612 Stiffness of left shoulder, not elsewhere classified: Secondary | ICD-10-CM | POA: Diagnosis not present

## 2017-10-11 DIAGNOSIS — M7502 Adhesive capsulitis of left shoulder: Secondary | ICD-10-CM

## 2017-10-11 NOTE — Progress Notes (Signed)
Nicole Campos. Nicole [facility]pos [facility] [phone]  Charrisse Masley - 57 y.o. female MRN [id number]  Date of birth: 1960-06-20  Visit Date: 10/11/2017  PCP: Shelva Majestic, MD   Referred by: Shelva Majestic, MD  Scribe(s) for today's visit: Stevenson Clinch, CMA  SUBJECTIVE:  Nicole Campos is here for Follow-up (L shoulder pain)   04/13/17: Nicole Campos is a new pt presenting today w/ c/o L shoulder pain.  She was referred to Dr. Berline Chough by Dr. Sherryle Lis w/ [facility].  Pt states that she was diagnosed w/ frozen shoulder and has recently had an MRI.  She states that she fell and hit the edge of a storage bin in Feb. 2018.  She states that this happened in the middle of the night, went back to bed and then woke up and her whole L upper arm was bruised.  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  She reports that the pain seems to have transitioned from her neck to her L shoulder.Pt states that she saw Dr. Sherryle Lis this morning and had a pizo treatment and notes L ant, post shoulder pain as well as pain in a strip in her L upper arm in the same exact location where she initially hit her arm. Pt states that her chiropractic care has increased her L shoulder ROM but doesn't really note a change in her pain.  Pt states that her pain is most severe at night and causes her to have difficulty falling an staying asleep. Pt has tried all the OTC pain meds/anti-inflammatories and has tried Tramadol and doesn't feel like any of those help her pain. Pt states that her L shoulder and L upper arm pain are a 5-6/10 and she describes the pain as a "twingy, ache."  She also notes that the pain increases to a 10/10 at it's worst.  05/25/17: Compared to the last office visit, her previously described symptoms are improving, every once in a while she will feel a twinge in her  shoulder. She does have flare-up after doing Yoga. When pain is present it comes and goes quickly and is only with certain movements.  Current symptoms are mild & are radiating to the bicep She has been seeing a chiropractor ([facility]) with some relief. She has tried OTC anit-inflammatories prn with some relief.  She had Kenalog injection 04/13/17  08/17/17: Compared to the last office visit on 05/25/17, her previously described L shoulder symptoms are worsening over the past few days.  She states that she attempted to reach behind her to pick something up when she had a twinge of sharp pain.  Her most limited motion is functional IR behind the back.  She will be resuming yoga 1x/week. Current symptoms are 3/10 & are radiating to the L upper arm w/ certain movements which is rare. She has been going to see a chiropractor and takes OTC anti-inflammatories prn w/ some relief.  10/11/2017:  Compared to the last office visit, her previously described symptoms are improving. Still has some pain when reaching overhead.  Current symptoms are mild & are radiating to the L upper arm, feels more like tightness. She no longer has pain at night.  She has been going to PT with Lauren, 2 x weekly for a while now once weekly. She takes Advil PM prn at night.  She has been stretching and working on Land[employer] Financial[employer]. She stopped Elavil about 1 week ago.    REVIEW OF SYSTEMS: Denies night time disturbances. Denies fevers, chills, or night sweats. Denies unexplained weight loss. Denies personal history of cancer. Denies changes in bowel or bladder habits. Denies recent unreported falls. Denies new or worsening dyspnea or wheezing. Denies headaches or dizziness.  Denies numbness, tingling or weakness  In the extremities.  Denies dizziness or presyncopal episodes Denies lower extremity edema    HISTORY & PERTINENT PRIOR DATA:  Prior History reviewed and updated per electronic medical record.  Significant/pertinent  history, findings, studies include:  reports that she has never smoked. She has never used smokeless tobacco. No results for input(s): HGBA1C, LABURIC, CREATINE in the last 8760 hours. The 10-year ASCVD risk score Denman George(Goff DC Montez HagemanJr., et al., 2013) is: 3%   Values used to calculate the score:     Age: [age] years     Sex: Female     Is Non-Hispanic African American: No     Diabetic: No     Tobacco smoker: No     Systolic Blood Pressure: 120 mmHg     Is BP treated: Yes     HDL Cholesterol: 67.7 mg/dL     Total Cholesterol: 200 mg/dL No problems updated.  OBJECTIVE:  VS:  HT:5\' 11"  (180.3 cm)   WT:220 lb 3.2 oz (99.9 kg)  BMI:30.73    BP:120/84  HR:74bpm  TEMP: ( )  RESP:94 %   PHYSICAL EXAM: Constitutional: WDWN, Non-toxic appearing. Psychiatric: Alert & appropriately interactive.  Not depressed or anxious appearing. Respiratory: No increased work of breathing.  Trachea Midline Eyes: Pupils are equal.  EOM intact without nystagmus.  No scleral icterus  Vascular Exam: warm to touch no edema  upper extremity neuro exam: unremarkable normal strength normal sensation  MSK Exam: Shoulder arc at 30 degrees of abduction is 60 degrees which is markedly improved compared to last visit.  She has internal rotation to the left PSIS.  Overhead reach is to 140 degrees.  Intrinsic rotator cuff strength is intact.  She has a thoracic rotation per procedure note.   ASSESSMENT & PLAN:   1. Adhesive capsulitis of left shoulder   2. Stiffness of left shoulder, not elsewhere classified   3. Acute pain of left shoulder     PLAN: She is doing significantly better with an almost 40 degree improvement in her shoulder arc.  We will have her continue working on home therapeutic exercises.  Osteopathic manipulation performed today as below to work on thoracic mobility.  9131-month follow-up to recheck clinical improvement.  She has weaned off of the Elavil.   Follow-up: Return in about 3 months (around  01/03/2018).    PROCEDURE NOTE : OSTEOPATHIC MANIPULATION The decision today to treat with Osteopathic Manipulative Therapy (OMT) was based on physical exam findings. Verbal consent was obtained following a discussion with the patient regarding the of risks, benefits and potential side effects, including an acute pain flare,post manipulation soreness and need for repeat treatments.     Contraindications to OMT: NONE  Manipulation was performed as below: Regions treated: Thoracic spine OMT Techniques Used: HVLA, muscle energy and myofascial release  The patient tolerated the treatment well and reported Improved symptoms following treatment today. Patient was given medications, exercises, stretches and lifestyle modifications per AVS and verbally.   OSTEOPATHIC/STRUCTURAL EXAM:   T2-T8 neutral rotated left, side bent right     Please see additional documentation for Objective,  Assessment and Plan sections. Pertinent additional documentation may be included in corresponding procedure notes, imaging studies, problem based documentation and patient instructions. Please see these sections of the encounter for additional information regarding this visit.  CMA/ATC served as Neurosurgeon during this visit. History, Physical, and Plan performed by medical provider. Documentation and orders reviewed and attested to.      Andrena Mews, DO    [facility] Physician

## 2017-10-17 ENCOUNTER — Encounter: Payer: Self-pay | Admitting: Physical Therapy

## 2017-10-17 ENCOUNTER — Ambulatory Visit: Payer: Commercial Managed Care - PPO | Admitting: Physical Therapy

## 2017-10-17 DIAGNOSIS — M25512 Pain in left shoulder: Secondary | ICD-10-CM | POA: Diagnosis not present

## 2017-10-17 DIAGNOSIS — M25612 Stiffness of left shoulder, not elsewhere classified: Secondary | ICD-10-CM

## 2017-10-17 NOTE — Therapy (Signed)
[facility] [address] [place], [place], [address] Phone: [phone]   Fax:  [phone]  Physical Therapy Treatment  Patient Details  Name: Nicole CairoSusan Lynne Seelig MRN: [id number] Date of Birth: 07-08-1960 Referring Provider: Gaspar BiddingMichael Rigby   Encounter Date: 10/17/2017  PT End of Session - 10/17/17 1315    Visit Number  7    Number of Visits  12    Date for PT Re-Evaluation  10/23/17    Authorization Type  UHC/UMR    PT Start Time  1215    PT Stop Time  1258    PT Time Calculation (min)  43 min    Activity Tolerance  Patient tolerated treatment well    Behavior During Therapy  [facility] for tasks assessed/performed       Past Medical History:  Diagnosis Date  . Arthritis    cervical, lumbar, knees, feet. mild hands  . Headache(784.0)    Hx: of occasinal Migraine  . Heart palpitations    Hx: of  . Hypertension   . PONV (postoperative nausea and vomiting)     Past Surgical History:  Procedure Laterality Date  . APPENDECTOMY    . CESAREAN SECTION  1990  . CHOLECYSTECTOMY N/A 02/28/2013   Procedure: LAPAROSCOPIC CHOLECYSTECTOMY;  Surgeon: Axel FillerArmando Ramirez, MD;  Location: [facility];  Service: General;  Laterality: N/A;  . COLONOSCOPY W/ POLYPECTOMY     Hx: of  . KNEE SURGERY     Left knee - meniscus tear  . LAPAROSCOPIC APPENDECTOMY N/A 08/05/2012   Procedure: APPENDECTOMY LAPAROSCOPIC;  Surgeon: Clovis Puhomas A. Cornett, MD;  Location: [facility];  Service: General;  Laterality: N/A;  . wisdom teeth      There were no vitals filed for this visit.  Subjective Assessment - 10/17/17 1313    Subjective  Pt states shoulder is stiff in mornings, and she has to work to get it "loosened up" throughout the day. She notes increased pain tightness in neck today.     Currently in Pain?  Yes    Pain Score  4     Pain Location  Shoulder    Pain Orientation  Left    Pain Descriptors / Indicators  Aching    Pain Type  Acute pain;Chronic pain    Pain  Onset  More than a month ago    Pain Frequency  Intermittent    Multiple Pain Sites  Yes    Pain Score  5    Pain Location  Neck    Pain Orientation  Right;Left    Pain Descriptors / Indicators  Tightness    Pain Type  Acute pain    Pain Onset  In the past 7 days    Pain Frequency  Intermittent                       [facility] Adult PT Treatment/Exercise - 10/17/17 1216      Exercises   Exercises  Shoulder      Shoulder Exercises: Supine   External Rotation  --    External Rotation Limitations  --    Flexion  --    Shoulder Flexion Weight (lbs)  --      Shoulder Exercises: Standing   External Rotation  --    Theraband Level (Shoulder External Rotation)  --    Internal Rotation  --    Theraband Level (Shoulder Internal Rotation)  --    Flexion  15 reps  Row  20 reps    Theraband Level (Shoulder Row)  Level 3 (Green)    Other Standing Exercises  Upper trap and levator stretch 30 sec x2 bil;     Other Standing Exercises  IR behind back stretch x10 with strap      Shoulder Exercises: Pulleys   Flexion  2 minutes      Manual Therapy   Manual Therapy  Joint mobilization;Passive ROM    Manual therapy comments  skilled palpation and monitoring of soft tissue during DN    Joint Mobilization  Grade 2 & 3  GHJ mobs all directions: standing IR mobilization with movement x3;     Passive ROM  L GHJ all motions       Trigger Point Dry Needling - 10/17/17 1313    Consent Given?  Yes    Education Handout Provided  Yes    Muscles Treated Upper Body  Upper trapezius;Levator scapulae    Upper Trapezius Response  Twitch reponse elicited;Palpable increased muscle length    Levator Scapulae Response  Twitch response elicited;Palpable increased muscle length             PT Short Term Goals - 10/08/17 1656      PT SHORT TERM GOAL #1   Title  Pt to be independent with initial HEP     Time  2    Period  Weeks    Status  Achieved        PT Long Term Goals -  09/11/17 1324      PT LONG TERM GOAL #1   Title  Pt to demo improved L shoulder AROM to be WNL, to improve ability for ADLS and reaching.     Time  6    Period  Weeks    Status  New    Target Date  10/23/17      PT LONG TERM GOAL #2   Title  Pt to demo improved L shoulder Strength to at least 4+/5 to improve ability for lifting, carrying    Time  6    Period  Weeks    Target Date  10/23/17      PT LONG TERM GOAL #3   Title  Pt to report decreased pain in L shoulder, to 0-2/10 with activity     Time  6    Period  Weeks    Status  New    Target Date  10/23/17      PT LONG TERM GOAL #4   Title  Pt to be independent with final HEP for shoulder mobility and strengthening.     Time  6    Period  Weeks    Status  New    Target Date  10/23/17            Plan - 10/17/17 1316    Clinical Impression Statement  Pt with good response to dry needling today, with improved tightness in neck and shoulder region with ROM. She continues to have end range soreness due to end range joint stiffness. Pt continuting to show improvements, although slow.     Rehab Potential  Good    PT Frequency  2x / week    PT Duration  6 weeks    PT Treatment/Interventions  ADLs/Self Care Home Management;Cryotherapy;Electrical Stimulation;Iontophoresis 4mg /ml Dexamethasone;Moist Heat;Therapeutic activities;Ultrasound;Therapeutic exercise;Neuromuscular re-education;Patient/family education;Dry needling;Passive range of motion;Manual techniques;Taping    Consulted and Agree with Plan of Care  Patient       Patient  will benefit from skilled therapeutic intervention in order to improve the following deficits and impairments:  Hypomobility, Decreased activity tolerance, Decreased strength, Pain, Impaired UE functional use, Increased muscle spasms, Decreased mobility, Decreased range of motion, Improper body mechanics, Impaired flexibility  Visit Diagnosis: Stiffness of left shoulder, not elsewhere  classified  Acute pain of left shoulder     Problem List Patient Active Problem List   Diagnosis Date Noted  . Hyperlipidemia 07/19/2017  . Hyperglycemia 07/19/2017  . Palpitations 05/28/2017  . Vertigo 05/28/2017  . Adhesive capsulitis of left shoulder 01/30/2017  . Hypertension 05/02/2016  . Diffuse pain 05/02/2016  . Family history of colon cancer 05/02/2016  . Arthritis   . Microscopic hematuria 10/16/2011    Sedalia Muta, PT, DPT 1:18 PM  10/17/17    [facility] [address] [place], [place], [address] Phone: [phone]   Fax:  [phone]  Name: Elia Nunley MRN: [id number] Date of Birth: 13-Jun-1960

## 2017-10-29 ENCOUNTER — Ambulatory Visit: Payer: Commercial Managed Care - PPO | Admitting: Physical Therapy

## 2017-10-29 DIAGNOSIS — M25612 Stiffness of left shoulder, not elsewhere classified: Secondary | ICD-10-CM

## 2017-10-29 DIAGNOSIS — M25512 Pain in left shoulder: Secondary | ICD-10-CM

## 2017-10-31 ENCOUNTER — Encounter: Payer: Self-pay | Admitting: Physical Therapy

## 2017-10-31 NOTE — Therapy (Signed)
[facility] [address] [place], [place], [address] Phone: [phone]   Fax:  [phone]  Physical Therapy Treatment/Re-Cert  Patient Details  Name: Nicole Campos MRN: [id number] Date of Birth: June 20, 1960 Referring Provider: Teresa Coombs   Encounter Date: 10/29/2017  PT End of Session - 10/31/17 1004    Visit Number  8    Number of Visits  12    Date for PT Re-Evaluation  10/29/17    Authorization Type  UHC/UMR    PT Start Time  1215    PT Stop Time  1257    PT Time Calculation (min)  42 min    Activity Tolerance  Patient tolerated treatment well    Behavior During Therapy  [facility] for tasks assessed/performed       Past Medical History:  Diagnosis Date  . Arthritis    cervical, lumbar, knees, feet. mild hands  . Headache(784.0)    Hx: of occasinal Migraine  . Heart palpitations    Hx: of  . Hypertension   . PONV (postoperative nausea and vomiting)     Past Surgical History:  Procedure Laterality Date  . APPENDECTOMY    . CESAREAN SECTION  1990  . CHOLECYSTECTOMY N/A 02/28/2013   Procedure: LAPAROSCOPIC CHOLECYSTECTOMY;  Surgeon: Ralene Ok, MD;  Location: [facility];  Service: General;  Laterality: N/A;  . COLONOSCOPY W/ POLYPECTOMY     Hx: of  . KNEE SURGERY     Left knee - meniscus tear  . LAPAROSCOPIC APPENDECTOMY N/A 08/05/2012   Procedure: APPENDECTOMY LAPAROSCOPIC;  Surgeon: Joyice Faster. Cornett, MD;  Location: [facility];  Service: General;  Laterality: N/A;  . wisdom teeth      There were no vitals filed for this visit.  Subjective Assessment - 10/31/17 1001    Subjective  Pt states that she has no pain with regular activities, work, IADLs, etc. She does continue to complain of pain with HEP and ther ex. She states tightness in shoulder, and neck, she feels that everything "tightens up" when she is sleeping, and she has to work during the day to loosen it up.     Currently in Pain?  Yes    Pain Score  4     Pain Orientation  Left    Pain Descriptors / Indicators  Aching    Pain Type  Acute pain;Chronic pain    Pain Onset  More than a month ago    Pain Frequency  Intermittent    Aggravating Factors   End range flexion, IR, ER         [facility] PT Assessment - 10/31/17 0001      AROM   Left Shoulder Flexion  150 Degrees      PROM   Left Shoulder Flexion  155 Degrees    Left Shoulder ABduction  155 Degrees      Strength   Strength Assessment Site  --   Strength taken at mid ranges    Left Shoulder Flexion  5/5    Left Shoulder ABduction  5/5    Left Shoulder Internal Rotation  5/5    Left Shoulder External Rotation  5/5      Palpation   Palpation comment  Pt with increased pain with over pressure into end range flexion, ER at 90 deg, and with IR stretch behind back.                    [facility] Adult PT Treatment/Exercise -  10/31/17 0956      Exercises   Exercises  Shoulder      Shoulder Exercises: Supine   External Rotation  20 reps;AROM    External Rotation Limitations  at 90 deg      Shoulder Exercises: Standing   External Rotation  20 reps;Theraband    Theraband Level (Shoulder External Rotation)  Level 3 (Green)    Internal Rotation  20 reps;Theraband    Theraband Level (Shoulder Internal Rotation)  Level 3 (Green)    Flexion  15 reps    Row  20 reps    Theraband Level (Shoulder Row)  Level 3 (Green)    Other Standing Exercises  IR behind back stretch 2 UE x10;       Shoulder Exercises: Pulleys   Flexion  2 minutes      Shoulder Exercises: Stretch   Other Shoulder Stretches  Sleeper stretch s/l 30 sec x3;       Manual Therapy   Manual Therapy  Joint mobilization;Passive ROM    Joint Mobilization  Grade 2 & 3  GHJ mobs all directions:     Passive ROM  L GHJ all motions       Trigger Point Dry Needling - 10/31/17 0959    Consent Given?  Yes    Muscles Treated Upper Body  Upper trapezius   Latissimus/ L    Upper Trapezius Response  Twitch reponse  elicited;Palpable increased muscle length           PT Education - 10/31/17 1004    Education Details  Final HEP.     Person(s) Educated  Patient    Methods  Explanation    Comprehension  Verbalized understanding       PT Short Term Goals - 10/08/17 1656      PT SHORT TERM GOAL #1   Title  Pt to be independent with initial HEP     Time  2    Period  Weeks    Status  Achieved        PT Long Term Goals - 10/31/17 1005      PT LONG TERM GOAL #1   Title  Pt to demo improved L shoulder AROM to be WNL, to improve ability for ADLS and reaching.     Time  6    Period  Weeks    Status  Partially Met      PT LONG TERM GOAL #2   Title  Pt to demo improved L shoulder Strength to at least 4+/5 to improve ability for lifting, carrying    Time  6    Period  Weeks    Status  Achieved      PT LONG TERM GOAL #3   Title  Pt to report decreased pain in L shoulder, to 0-2/10 with activity     Time  6    Period  Weeks    Status  Achieved      PT LONG TERM GOAL #4   Title  Pt to be independent with final HEP for shoulder mobility and strengthening.     Time  6    Period  Weeks    Status  Achieved            Plan - 10/31/17 1006    Clinical Impression Statement  Pt with much improved ROM. She does have stiffness and pain at end ranges of flexion and ER at 90 deg, as well as stretching with IR behind  back. Her ROM deficits are very minimal, and pt has regained full strength. Discussed at length with pt today, that her ROM and functional deficits are minimal, and she is doing all regular activities without pain. She continues to be discouraged by the fact that she has pain with stretching at end ranges with HEP. Discussed not pushing/ stretching too hard to cause pain. Pt has met goals for PT, and is ready for d/c to HEP. Pt in agreement with plan.     Rehab Potential  Good    PT Frequency  1x / week    PT Duration  Other (comment)    PT Treatment/Interventions  ADLs/Self Care  Home Management;Cryotherapy;Electrical Stimulation;Iontophoresis 25m/ml Dexamethasone;Moist Heat;Therapeutic activities;Ultrasound;Therapeutic exercise;Neuromuscular re-education;Patient/family education;Dry needling;Passive range of motion;Manual techniques;Taping    Consulted and Agree with Plan of Care  Patient       Patient will benefit from skilled therapeutic intervention in order to improve the following deficits and impairments:  Hypomobility, Decreased activity tolerance, Decreased strength, Pain, Impaired UE functional use, Increased muscle spasms, Decreased mobility, Decreased range of motion, Improper body mechanics, Impaired flexibility  Visit Diagnosis: Acute pain of left shoulder  Stiffness of left shoulder, not elsewhere classified     Problem List Patient Active Problem List   Diagnosis Date Noted  . Hyperlipidemia 07/19/2017  . Hyperglycemia 07/19/2017  . Palpitations 05/28/2017  . Vertigo 05/28/2017  . Adhesive capsulitis of left shoulder 01/30/2017  . Hypertension 05/02/2016  . Diffuse pain 05/02/2016  . Family history of colon cancer 05/02/2016  . Arthritis   . Microscopic hematuria 10/16/2011    LLyndee Hensen PT, DPT 10:13 AM  10/31/17    [facility] [address]: [phone]  Fax:  [phone] Name: Nicole MartoranaMRN: [id number] of Birth: 223-Jan-1962     PHYSICAL THERAPY DISCHARGE SUMMARY  Visits from Start of Care: 8   Plan: Patient agrees to discharge.  Patient goals were met. Patient is being discharged due to meeting the stated rehab goals.  ?????     LLyndee Hensen PT, DPT 10:13 AM  10/31/17

## 2017-12-06 ENCOUNTER — Encounter: Payer: Self-pay | Admitting: Family Medicine

## 2017-12-06 ENCOUNTER — Other Ambulatory Visit: Payer: Self-pay | Admitting: Sports Medicine

## 2018-01-11 ENCOUNTER — Ambulatory Visit: Payer: Commercial Managed Care - PPO | Admitting: Sports Medicine

## 2018-01-14 ENCOUNTER — Ambulatory Visit: Payer: Self-pay

## 2018-01-14 ENCOUNTER — Encounter: Payer: Self-pay | Admitting: Sports Medicine

## 2018-01-14 ENCOUNTER — Ambulatory Visit: Payer: Commercial Managed Care - PPO | Admitting: Sports Medicine

## 2018-01-14 VITALS — BP 122/78 | HR 73 | Ht 71.0 in | Wt 221.8 lb

## 2018-01-14 DIAGNOSIS — M7502 Adhesive capsulitis of left shoulder: Secondary | ICD-10-CM

## 2018-01-14 DIAGNOSIS — M25512 Pain in left shoulder: Secondary | ICD-10-CM

## 2018-01-14 DIAGNOSIS — M25612 Stiffness of left shoulder, not elsewhere classified: Secondary | ICD-10-CM

## 2018-01-14 NOTE — Progress Notes (Signed)
Veverly Fells. [facility] at [facility] [phone]  Kathee Tumlin - 56 y.o. female MRN [id number]  Date of birth: [birth year]  Visit Date: 01/14/2018  PCP: Shelva Majestic, MD   Referred by: Shelva Majestic, MD  Scribe(s) for today's visit: Stevenson Clinch, CMA  SUBJECTIVE:  Nicole Campos is here for Follow-up (L shoulder pain)   HPI:  04/13/17: Chera is a new pt presenting today w/ c/o L shoulder pain.  She was referred to Dr. Berline Chough by Dr. Sherryle Lis w/ [facility].  Pt states that she was diagnosed w/ frozen shoulder and has recently had an MRI.  She states that she fell and hit the edge of a storage bin in Feb. 2018.  She states that this happened in the middle of the night, went back to bed and then woke up and her whole L upper arm was bruised.  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  She reports that the pain seems to have transitioned from her neck to her L shoulder.Pt states that she saw Dr. Sherryle Lis this morning and had a pizo treatment and notes L ant, post shoulder pain as well as pain in a strip in her L upper arm in the same exact location where she initially hit her arm. Pt states that her chiropractic care has increased her L shoulder ROM but doesn't really note a change in her pain.  Pt states that her pain is most severe at night and causes her to have difficulty falling an staying asleep. Pt has tried all the OTC pain meds/anti-inflammatories and has tried Tramadol and doesn't feel like any of those help her pain. Pt states that her L shoulder and L upper arm pain are a 5-6/10 and she describes the pain as a "twingy, ache."  She also notes that the pain increases to a 10/10 at it's worst.  05/25/17: Compared to the last office visit, her previously described symptoms are improving, every once in a while she will feel a twinge in  her shoulder. She does have flare-up after doing Yoga. When pain is present it comes and goes quickly and is only with certain movements.  Current symptoms are mild & are radiating to the bicep She has been seeing a chiropractor ([facility]) with some relief. She has tried OTC anit-inflammatories prn with some relief.  She had Kenalog injection 04/13/17  08/17/17: Compared to the last office visit on 05/25/17, her previously described L shoulder symptoms are worsening over the past few days.  She states that she attempted to reach behind her to pick something up when she had a twinge of sharp pain.  Her most limited motion is functional IR behind the back.  She will be resuming yoga 1x/week. Current symptoms are 3/10 & are radiating to the L upper arm w/ certain movements which is rare. She has been going to see a chiropractor and takes OTC anti-inflammatories prn w/ some relief.  10/11/2017:  Compared to the last office visit, her previously described symptoms are improving. Still has some pain when reaching overhead.  Current symptoms are mild & are radiating to the L upper arm, feels more like tightness. She no longer has pain at night.  She has been going to PT with Lauren, 2 x weekly for a while now once weekly. She takes Advil PM prn  at night. She has been stretching and working on [employer]. She stopped Elavil about 1 week ago.   01/14/2018: Compared to the last office visit, her previously described symptoms are improving. She still has pain when doing things like making the bed, reaching, reaching behind her back. She feels like ROM has improved but pain is still the same with those certain movements.  Current symptoms are mild at rest, 8/10 with certain movements but that pain is short term.  Pain with certain movements is like a pulling sensation, like her arm is being pulled behind her back.  She has completed PT, her last visit was 10/29/17, she feels that overall PT was beneficial. She has  been doing HEP daily. She takes Advil PM at bedtime with some relief.   REVIEW OF SYSTEMS: Reports night time disturbances, occasional. Reports night sweats. Denies unexplained weight loss. Denies personal history of cancer. Denies changes in bowel or bladder habits. Denies recent unreported falls. Denies new or worsening dyspnea or wheezing. Denies headaches or dizziness.  Denies numbness, tingling or weakness in the extremities.  Denies dizziness or presyncopal episodes Denies lower extremity edema    HISTORY:  Prior history reviewed and updated per electronic medical record.  Social History   Occupational History  . Not on file  Tobacco Use  . Smoking status: Never Smoker  . Smokeless tobacco: Never Used  Substance and Sexual Activity  . Alcohol use: Yes    Alcohol/week: 3.0 standard drinks    Types: 3 Standard drinks or equivalent per week    Comment: occasional  . Drug use: No  . Sexual activity: Not on file   Social History   Social History Narrative   Married 1980. [name] is husband Marketing executive).3 kids Lavon Paganini. Works at [employer] lives with parents, Wandalee Ferdinand single living with parents works at [employer], Psychologist, counselling works at [employer] and in school in [place] living with parents.       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 No problems updated. .   OBJECTIVE:  VS:  HT:5\' 11"  (180.3 cm)   WT:221 lb 12.8 oz (100.6 kg)  BMI:30.95    BP:122/78  HR:73bpm  TEMP: ( )  RESP:98 %   PHYSICAL EXAM: CONSTITUTIONAL: Well-developed, Well-nourished and In no acute distress PSYCHIATRIC: Alert & appropriately interactive. and Not depressed or anxious appearing. RESPIRATORY: No increased work of breathing and  Trachea Midline EYES: Pupils are equal., EOM intact without nystagmus. and No scleral icterus.  VASCULAR EXAM: Warm and well perfused NEURO: unremarkable  MSK Exam: Left shoulder  Well aligned, no significant deformity. No overlying skin changes. TTP over infraspinatus muscle bellly.  Minimal over bony landmarks   RANGE OF MOTION & STRENGTH  Improved IR to L3.  Arc is still diminished on left comared to right by ~30degrees but overall ABduction improved to ~80degrees.   SPECIALITY TESTING:  no focal weakness with RTC testing.  No pain with hawkins, neers, Obrien's  no pain with axial load and circumduction     ASSESSMENT   1. Acute pain of left shoulder   2. Stiffness of left shoulder, not elsewhere classified   3. Adhesive capsulitis of left shoulder     PLAN:  Pertinent additional documentation may  be included in corresponding procedure notes, imaging studies, problem based documentation and patient instructions.  Procedures:  . US Guided Injection per procedure note  Medications:  No orders of the defined types were placed in this encounter.  Discussion/Instructions: No problem-specific Assessment & Plan notes found for this encounter.  . repeat injection today. Overall seems to continue to show steady improvement .   Marland Kitchen Continue previously prescribed home exercise program.  . Discussed red flag symptoms that warrant earlier emergent evaluation and patient voices understanding. . Activity modifications and the importance of avoiding exacerbating activities (limiting pain to no more than a 4 / 10 during or following activity) recommended and discussed.  Follow-up:  . Return in about 8 weeks (around 03/11/2018).  . If any lack of improvement consider: further diagnostic evaluation with MR Arthrogram for further eval of possible labral pathology but low liklihood     CMA/ATC served as scribe during this visit. History, Physical, and Plan performed by medical  provider. Documentation and orders reviewed and attested to.      Andrena Mews, DO    [facility] Physician

## 2018-01-14 NOTE — Procedures (Signed)
PROCEDURE NOTE:  Ultrasound Guided: Injection: Left shoulder, intra-articular Images were obtained and interpreted by myself, Gaspar Bidding, DO  Images have been saved and stored to PACS system. Images obtained on: GE S7 Ultrasound machine    DESCRIPTION OF PROCEDURE:  The patient's clinical condition is marked by substantial pain and/or significant functional disability. Other conservative therapy has not provided relief, is contraindicated, or not appropriate. There is a reasonable likelihood that injection will significantly improve the patient's pain and/or functional impairment.   After discussing the risks, benefits and expected outcomes of the injection and all questions were reviewed and answered, the patient wished to undergo the above named procedure.  Verbal consent was obtained.  The ultrasound was used to identify the target structure and adjacent neurovascular structures. The skin was then prepped in sterile fashion and the target structure was injected under direct visualization using sterile technique as below:  Single injection performed as below: PREP: Alcohol and Ethel Chloride APPROACH:posterior direct, single injection, 21g 2 in. INJECTATE: 2 cc 0.5% Marcaine and 2 cc 40mg /mL DepoMedrol ASPIRATE: None DRESSING: Band-Aid  Post procedural instructions including recommending icing and warning signs for infection were reviewed.    This procedure was well tolerated and there were no complications.   IMPRESSION: Succesful Ultrasound Guided: Injection

## 2018-01-14 NOTE — Patient Instructions (Addendum)
Try taking Zyrtec (cetirizine) 10 mg up to twice per day if you need to for the flushing.  It is okay to take up to 50 mg of Benadryl at night if you take Zyrtec in the morning but do not take 2 of the Zyrtec.    You had an injection today.  Things to be aware of after injection are listed below: . You may experience no significant improvement or even a slight worsening in your symptoms during the first 24 to 48 hours.  After that we expect your symptoms to improve gradually over the next 2 weeks for the medicine to have its maximal effect.  You should continue to have improvement out to 6 weeks after your injection. . Dr. Berline Chough recommends icing the site of the injection for 20 minutes  1-2 times the day of your injection . You may shower but no swimming, tub bath or Jacuzzi for 24 hours. . If your bandage falls off this does not need to be replaced.  It is appropriate to remove the bandage after 4 hours. . You may resume light activities as tolerated unless otherwise directed per Dr. Berline Chough during your visit  POSSIBLE STEROID SIDE EFFECTS:  Side effects from injectable steroids tend to be less than when taken orally however you may experience some of the symptoms listed below.  If experienced these should only last for a short period of time. Change in menstrual flow  Edema (swelling)  Increased appetite Skin flushing (redness)  Skin rash/acne  Thrush (oral) Yeast vaginitis    Increased sweating  Depression Increased blood glucose levels Cramping and leg/calf  Euphoria (feeling happy)  POSSIBLE PROCEDURE SIDE EFFECTS: The side effects of the injection are usually fairly minimal however if you may experience some of the following side effects that are usually self-limited and will is off on their own.  If you are concerned please feel free to call the office with questions:  Increased numbness or tingling  Nausea or vomiting  Swelling or bruising at the injection site   Please call our  office if if you experience any of the following symptoms over the next week as these can be signs of infection:   Fever greater than 100.64F  Significant swelling at the injection site  Significant redness or drainage from the injection site  If after 2 weeks you are continuing to have worsening symptoms please call our office to discuss what the next appropriate actions should be including the potential for a return office visit or other diagnostic testing.

## 2018-01-24 ENCOUNTER — Other Ambulatory Visit: Payer: Self-pay | Admitting: Radiology

## 2018-01-24 ENCOUNTER — Ambulatory Visit: Payer: Self-pay

## 2018-01-24 DIAGNOSIS — M25512 Pain in left shoulder: Secondary | ICD-10-CM

## 2018-03-18 ENCOUNTER — Ambulatory Visit (INDEPENDENT_AMBULATORY_CARE_PROVIDER_SITE_OTHER): Payer: Commercial Managed Care - PPO | Admitting: Sports Medicine

## 2018-03-18 ENCOUNTER — Encounter: Payer: Self-pay | Admitting: Sports Medicine

## 2018-03-18 DIAGNOSIS — M7502 Adhesive capsulitis of left shoulder: Secondary | ICD-10-CM

## 2018-03-18 NOTE — Progress Notes (Signed)
Nicole FellsMichael D. Delorise Shinerigby, DO  [facility] [phone]  Basilio CairoSusan Lynne Campos - 58 y.o. female MRN [id number]  Date of birth: [birth year]  Visit Date: 03/18/2018   PCP: Shelva MajesticHunter, Stephen O, MD   Referred by: Shelva MajesticHunter, Stephen O, MD   SUBJECTIVE:  Chief Complaint  Patient presents with  . f/u L shoulder pain    Received joint inj 01/14/18. Has completed PT. Takes Advil PM at bedtime. Doing HEP.    HPI: Patient is here for follow-up of her left shoulder pain.  She has been continue to perform her home therapeutic exercise but does report the seem to sometimes exacerbate her symptoms.  She denies any numbness or tingling.  No significant pain radiating down into her arm.  She has some limited range of motion especially with overhead.  She is also noted some catching and popping with the shoulder but this is been minimal.  He does feel the arm will have a give way on her if she leans forward on this shoulder but this is only intermittent.  REVIEW OF SYSTEMS: Negative per HPI  HISTORY:  Prior history reviewed and updated per electronic medical record.  Social History   Occupational History  . Not on file  Tobacco Use  . Smoking status: Never Smoker  . Smokeless tobacco: Never Used  Substance and Sexual Activity  . Alcohol use: Yes    Alcohol/week: 3.0 standard drinks    Types: 3 Standard drinks or equivalent per week    Comment: occasional  . Drug use: No  . Sexual activity: Not on file   Social History   Social History Narrative   Married 1980. [name] is husband Marketing executive([employer]).3 kids [name] Works at [employer]herbalife lives with parents, [name] single living with parents works at [employer], Psychologist, counsellingKalman works at [employer] and [place].       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 MRI of the shoulder on 01/02/17 from outside sources are reviewed that do show a likely anterior labral tear. Additional injection on 01/14/2018 without improvement    The 10-year ASCVD risk score Denman George(Goff DC Montez HagemanJr., et al., 2013) is: 3%   Values used to calculate the score:     Age: [age] years     Sex: Female     Is Non-Hispanic African American: No     Diabetic: No     Tobacco smoker: No     Systolic Blood Pressure: 120 mmHg     Is BP treated: Yes     HDL Cholesterol: 67.7 mg/dL     Total Cholesterol: 200 mg/dL  OBJECTIVE:  VS:  HT:    WT:   BMI:     BP:130/86  HR:71bpm  TEMP: ( )  RESP:96 %   PHYSICAL EXAM: Adult female.  No acute distress.  Alert and appropriate.  Left shoulder is well aligned.  She has improved range of motion with approximately a 80 degree arc while held at 30 degrees of abduction.  Her internal rotation  and external rotation strength is 5+/5.  She has 4 out of 5 strength with empty can testing, speeds testing and O'Brien's testing but no appreciable pain.  She does have crepitation with axial load and circumduction that is nonpainful.  It is reproducible intermittently.   ASSESSMENT   1. Adhesive capsulitis of left shoulder     PLAN:  Pertinent additional documentation may be included in corresponding procedure notes, imaging studies, problem based documentation and patient instructions.  Procedures:  None  Medications:  No orders of the defined types were placed in this encounter.   Discussion/Instructions: Adhesive capsulitis of left shoulder Symptoms are minimally changed from last visit.  She has only minimal residual interference in her day-to-day activities we will plan to have her  continue with home therapeutic exercises that are non-exacerbating and continue with chiropractic treatments on a monthly basis.  We will plan to have her follow-up only as needed if any persistent symptoms.  We will consider diagnostic/therapeutic arthroscopy if any persistent symptoms for likely high concern of anterior labral tear.  THERAPEUTIC EXERCISE: Discussed the foundation of treatment for this condition is physical therapy and/or daily (5-6 days/week) therapeutic exercises, focusing on core strengthening, coordination, neuromuscular control/reeducation.  Continue previously prescribed home exercise program.    If any lack of improvement: consider referral to Orthopedics for Diagnostic and therapeutic arthroscopy   Return if symptoms worsen or fail to improve.          Andrena Mews, DO    [facility] Physician

## 2018-03-18 NOTE — Assessment & Plan Note (Signed)
Symptoms are minimally changed from last visit.  She has only minimal residual interference in her day-to-day activities we will plan to have her continue with home therapeutic exercises that are non-exacerbating and continue with chiropractic treatments on a monthly basis.  We will plan to have her follow-up only as needed if any persistent symptoms.  We will consider diagnostic/therapeutic arthroscopy if any persistent symptoms for likely high concern of anterior labral tear.

## 2018-03-20 DIAGNOSIS — M9901 Segmental and somatic dysfunction of cervical region: Secondary | ICD-10-CM | POA: Diagnosis not present

## 2018-03-20 DIAGNOSIS — M47814 Spondylosis without myelopathy or radiculopathy, thoracic region: Secondary | ICD-10-CM | POA: Diagnosis not present

## 2018-03-20 DIAGNOSIS — M9906 Segmental and somatic dysfunction of lower extremity: Secondary | ICD-10-CM | POA: Diagnosis not present

## 2018-04-09 ENCOUNTER — Other Ambulatory Visit: Payer: Self-pay

## 2018-04-09 ENCOUNTER — Telehealth: Payer: Self-pay | Admitting: Family Medicine

## 2018-04-09 DIAGNOSIS — I10 Essential (primary) hypertension: Secondary | ICD-10-CM

## 2018-04-09 MED ORDER — METOPROLOL SUCCINATE ER 50 MG PO TB24: ORAL_TABLET | ORAL | 1 refills | Status: DC

## 2018-04-09 NOTE — Telephone Encounter (Signed)
MEDICATION: metoprolol succinate (TOPROL-XL) 50 MG 24 hr tablet  PHARMACY:   [id number] - [place], [place] - [address] [phone] (Phone) [phone] (Fax)     IS THIS A 90 DAY SUPPLY : same as last time  IS PATIENT OUT OF MEDICATION: n/a  IF NOT; HOW MUCH IS LEFT:   LAST APPOINTMENT DATE: @1 /08/2018  NEXT APPOINTMENT DATE:@5 /14/2020  OTHER COMMENTS:    **Let patient know to contact pharmacy at the end of the day to make sure medication is ready. **  ** Please notify patient to allow 48-72 hours to process**  **Encourage patient to contact the pharmacy for refills or they can request refills through [facility]**

## 2018-04-09 NOTE — Telephone Encounter (Signed)
Prescription sent to pharmacy as requested.

## 2018-04-15 DIAGNOSIS — M9906 Segmental and somatic dysfunction of lower extremity: Secondary | ICD-10-CM | POA: Diagnosis not present

## 2018-04-15 DIAGNOSIS — M9901 Segmental and somatic dysfunction of cervical region: Secondary | ICD-10-CM | POA: Diagnosis not present

## 2018-04-15 DIAGNOSIS — M47814 Spondylosis without myelopathy or radiculopathy, thoracic region: Secondary | ICD-10-CM | POA: Diagnosis not present

## 2018-05-13 DIAGNOSIS — M9901 Segmental and somatic dysfunction of cervical region: Secondary | ICD-10-CM | POA: Diagnosis not present

## 2018-05-13 DIAGNOSIS — M47814 Spondylosis without myelopathy or radiculopathy, thoracic region: Secondary | ICD-10-CM | POA: Diagnosis not present

## 2018-05-13 DIAGNOSIS — M9906 Segmental and somatic dysfunction of lower extremity: Secondary | ICD-10-CM | POA: Diagnosis not present

## 2018-06-10 DIAGNOSIS — M47814 Spondylosis without myelopathy or radiculopathy, thoracic region: Secondary | ICD-10-CM | POA: Diagnosis not present

## 2018-06-10 DIAGNOSIS — M9906 Segmental and somatic dysfunction of lower extremity: Secondary | ICD-10-CM | POA: Diagnosis not present

## 2018-06-10 DIAGNOSIS — M9901 Segmental and somatic dysfunction of cervical region: Secondary | ICD-10-CM | POA: Diagnosis not present

## 2018-07-08 DIAGNOSIS — M9901 Segmental and somatic dysfunction of cervical region: Secondary | ICD-10-CM | POA: Diagnosis not present

## 2018-07-08 DIAGNOSIS — M9906 Segmental and somatic dysfunction of lower extremity: Secondary | ICD-10-CM | POA: Diagnosis not present

## 2018-07-08 DIAGNOSIS — M47814 Spondylosis without myelopathy or radiculopathy, thoracic region: Secondary | ICD-10-CM | POA: Diagnosis not present

## 2018-07-23 ENCOUNTER — Encounter: Payer: Commercial Managed Care - PPO | Admitting: Family Medicine

## 2018-07-25 ENCOUNTER — Encounter: Payer: Commercial Managed Care - PPO | Admitting: Family Medicine

## 2018-09-25 ENCOUNTER — Ambulatory Visit: Payer: Commercial Managed Care - PPO | Admitting: Family Medicine

## 2018-09-25 ENCOUNTER — Other Ambulatory Visit: Payer: Self-pay

## 2018-09-25 ENCOUNTER — Encounter: Payer: Self-pay | Admitting: Family Medicine

## 2018-09-25 VITALS — BP 116/82 | HR 65 | Temp 98.2°F | Ht 71.0 in | Wt 227.0 lb

## 2018-09-25 DIAGNOSIS — G629 Polyneuropathy, unspecified: Secondary | ICD-10-CM | POA: Diagnosis not present

## 2018-09-25 DIAGNOSIS — E785 Hyperlipidemia, unspecified: Secondary | ICD-10-CM

## 2018-09-25 DIAGNOSIS — Z0000 Encounter for general adult medical examination without abnormal findings: Secondary | ICD-10-CM | POA: Diagnosis not present

## 2018-09-25 DIAGNOSIS — E669 Obesity, unspecified: Secondary | ICD-10-CM

## 2018-09-25 DIAGNOSIS — Z800 Family history of malignant neoplasm of digestive organs: Secondary | ICD-10-CM

## 2018-09-25 DIAGNOSIS — K5900 Constipation, unspecified: Secondary | ICD-10-CM

## 2018-09-25 DIAGNOSIS — R1906 Epigastric swelling, mass or lump: Secondary | ICD-10-CM

## 2018-09-25 DIAGNOSIS — I10 Essential (primary) hypertension: Secondary | ICD-10-CM

## 2018-09-25 DIAGNOSIS — R739 Hyperglycemia, unspecified: Secondary | ICD-10-CM

## 2018-09-25 LAB — COMPREHENSIVE METABOLIC PANEL
ALT: 17 U/L (ref 0–35)
AST: 17 U/L (ref 0–37)
Albumin: 4.7 g/dL (ref 3.5–5.2)
Alkaline Phosphatase: 62 U/L (ref 39–117)
BUN: 13 mg/dL (ref 6–23)
CO2: 30 mEq/L (ref 19–32)
Calcium: 9.9 mg/dL (ref 8.4–10.5)
Chloride: 104 mEq/L (ref 96–112)
Creatinine, Ser: 0.69 mg/dL (ref 0.40–1.20)
GFR: 86.66 mL/min (ref >60.00)
Glucose, Bld: 84 mg/dL (ref 70–99)
Potassium: 4.5 mEq/L (ref 3.5–5.1)
Sodium: 141 mEq/L (ref 135–145)
Total Bilirubin: 0.7 mg/dL (ref 0.2–1.2)
Total Protein: 6.9 g/dL (ref 6.0–8.3)

## 2018-09-25 LAB — LIPID PANEL
Cholesterol: 242 mg/dL — ABNORMAL HIGH (ref 0–200)
HDL: 69.7 mg/dL
LDL Cholesterol: 157 mg/dL — ABNORMAL HIGH (ref 0–99)
NonHDL: 172.01
Total CHOL/HDL Ratio: 3
Triglycerides: 76 mg/dL (ref 0.0–149.0)
VLDL: 15.2 mg/dL (ref 0.0–40.0)

## 2018-09-25 LAB — CBC
HCT: 42 % (ref 36.0–46.0)
Hemoglobin: 14.3 g/dL (ref 12.0–15.0)
MCHC: 34.1 g/dL (ref 30.0–36.0)
MCV: 95.6 fl (ref 78.0–100.0)
Platelets: 285 10*3/uL (ref 150.0–400.0)
RBC: 4.4 Mil/uL (ref 3.87–5.11)
RDW: 12.7 % (ref 11.5–15.5)
WBC: 5.1 10*3/uL (ref 4.0–10.5)

## 2018-09-25 LAB — VITAMIN B12: Vitamin B-12: 551 pg/mL (ref 211–911)

## 2018-09-25 LAB — TSH: TSH: 1.11 u[IU]/mL (ref 0.35–4.50)

## 2018-09-25 LAB — HEMOGLOBIN A1C: Hgb A1c MFr Bld: 5.1 % (ref 4.6–6.5)

## 2018-09-25 NOTE — Assessment & Plan Note (Signed)
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 Unclear cause. Started after knee surgery. Wonders if related to thick calluses - has an old cream that was prescription- she will sned me the name

## 2018-09-25 NOTE — Progress Notes (Signed)
Phone: [phone]   Subjective:  Patient presents today for their annual physical. Chief complaint-noted.   See problem oriented charting- ROS- full  review of systems was completed and negative except for: vertigo, arthritic pains- sees chiropractor  The following were reviewed and entered/updated in epic: Past Medical History:  Diagnosis Date  . Arthritis    cervical, lumbar, knees, feet. mild hands  . Headache(784.0)    Hx: of occasinal Migraine  . Heart palpitations    Hx: of  . Hypertension   . PONV (postoperative nausea and vomiting)    Patient Active Problem List   Diagnosis Date Noted  . Hyperlipidemia 07/19/2017    Priority: Medium  . Hyperglycemia 07/19/2017    Priority: Medium  . Palpitations 05/28/2017    Priority: Medium  . Hypertension 05/02/2016    Priority: Medium  . Diffuse pain 05/02/2016    Priority: Medium  . Arthritis     Priority: Medium  . Adhesive capsulitis of left shoulder 01/30/2017    Priority: Low  . Family history of colon cancer 05/02/2016    Priority: Low  . Microscopic hematuria 10/16/2011    Priority: Low  . Vertigo 05/28/2017   Past Surgical History:  Procedure Laterality Date  . APPENDECTOMY    . CESAREAN SECTION  1990  . CHOLECYSTECTOMY N/A 02/28/2013   Procedure: LAPAROSCOPIC CHOLECYSTECTOMY;  Surgeon: Axel FillerArmando Ramirez, MD;  Location: [facility];  Service: General;  Laterality: N/A;  . COLONOSCOPY W/ POLYPECTOMY     Hx: of  . KNEE SURGERY     Left knee - meniscus tear  . LAPAROSCOPIC APPENDECTOMY N/A 08/05/2012   Procedure: APPENDECTOMY LAPAROSCOPIC;  Surgeon: Clovis Puhomas A. Cornett, MD;  Location: [facility];  Service: General;  Laterality: N/A;  . wisdom teeth      Family History  Problem Relation Age of Onset  . Uterine cancer Mother   . Hypertension Mother   . Pancreatic cancer Father   . Cancer - Colon Brother   . Stroke Maternal Grandmother         late 3190s  . Lung cancer Maternal Grandfather   . Heart attack Paternal  Grandmother        late 4680s  . Heart attack Paternal Grandfather        died 3355    Medications- reviewed and updated Current Outpatient Medications  Medication Sig Dispense Refill  . ibuprofen (ADVIL,MOTRIN) 200 MG tablet Take 400 mg by mouth 2 (two) times daily as needed for mild pain.    . metoprolol succinate (TOPROL-XL) 50 MG 24 hr tablet TAKE 1 TABLET BY MOUTH EVERY DAY WITH OR IMMEDIATELY FOLLOWING A MEAL 91 tablet 1   No current facility-administered medications for this visit.     Allergies-reviewed and updated Allergies  Allergen Reactions  . Penicillins Itching    Pt states this may be an error and that she "was given a derivative" of PCN with no reactions at all.    Social History   Social History Narrative   Married 1980. Nicole Campos is husband Marketing executive([employer]).3 kids Nicole Paganinihomas Jr. Works at [employer] lives with parents, [name] single living with parents works at [employer], Psychologist, counsellingKalman works at [employer] [place].       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 Oropharynx normal Neck: no thyromegaly or cervical lymphadenopathy CV: RRR no murmurs rubs or gallops Lungs: CTAB no crackles, wheeze, rhonchi Abdomen: soft/nontender/nondistended/normal bowel sounds. No rebound or guarding.  Ext: no edema and 2+ PT pulses Skin: warm, dry Neuro: grossly normal, moves all extremities, PERRLA   Assessment and Plan    58 y.o. female presenting for annual physical.  Health Maintenance counseling: 1.  Anticipatory guidance: Patient counseled regarding regular dental exams -q6 months, eye exams - may be due- is going to check,  avoiding smoking and second hand smoke , limiting alcohol to 1 beverage per day .   2. Risk factor reduction:  Advised patient of need for regular exercise and diet rich and fruits and vegetables to reduce risk of heart attack and stroke. Exercise- doing steps 10x a day plus trying to get up to stretch during work at home. Diet-weight went up with covid 19- did a lot of pasta/rice at first- back on track now doing more fruits/veggies.  Wt Readings from Last 3 Encounters:  09/25/18 227 lb (103 kg)  01/14/18 221 lb 12.8 oz (100.6 kg)  10/11/17 220 lb 3.2 oz (99.9 kg)  3. Immunizations/screenings/ancillary studies Immunization History  Administered Date(s) Administered  . Influenza,inj,Quad PF,6+ Mos 05/02/2016  . Tdap 05/02/2016  4. Cervical cancer screening- with Dr. Henderson CloudHorvath- has visit in august- had pap may 2019 which was normal 5. Breast cancer screening-  breast exam with GYN and mammogram 07/2017 and will have again this year 6. Colon cancer screening -  Saw [facility] previously 5 years ago and told 5 year follow up due to family history in Brother. Thinks she saw Dr. Dulce Sellarutlaw 7. Skin cancer screening- Skin surgery center yearly. advised regular sunscreen use. Denies worrisome, changing, or new skin lesions.  8. Birth control/STD check- monogamous/postmenopausal 9. Osteoporosis screening at [age]- has not had bone density, will plan on this at 63 if doesn't have one with GYN 10. Never smoker  Status of chronic or acute concerns   # social update- has been able to work for home. Daughter also working at home for same company. Feels quality of life actually higher.   HTN - Taking Metoprolol XL 50 mg daily.  Minimal palpitations   Hyperlipidemia - mild issues, update lipid panel, not on medication  Hyperglycemia/obesity - has had some high cbgs in past- will check a1c  for more information and possibility of prediabetes  Neuropathy in feet- has had nerve conduction studies in past. Unclear cause. Started after knee surgery. Wonders if related to thick calluses - has an old cream that was prescription- she will send me the name  Constipation  - she feels like she is not digesting food well since having her cholecystectomy. She has tried OTC stool softners with no relief also dulcolax. Feels like certain foods do better than others. Feels like foods sit in top of stomach- worried about taking tums- could try pepcid - discussed could try probiotic and ask GI who we are referring to for colonoscopy as  Well  Vertigo recurrent issues- did not tolerate home exercises- offered PT vestibular rehab if needed in future. Cannot do flip turn at pool due to this.  Takes it slow- follows up with chiropractor  Arthritis- takes advil pm in the evening. Able to skip it on weekends. Discussed kidney risks- she is trying to just take 1 five times a week  Recommended follow up: 1 year physical. No future appointments.  Lab/Order associations:Coffee with half-and-half No diagnosis found.  No orders of the defined types were placed in this encounter.   Return precautions advised.  Garret Reddish, MD

## 2018-09-25 NOTE — Patient Instructions (Addendum)
Health Maintenance Due  Topic Date Due  . COLONOSCOPY -We will call you within two weeks about your referral to [facility]. If you do not hear within 3 weeks, give [place] a call.  03/13/2018   could try align or florastor probiotics  Could try pepcid OTC in case there is an element of reflux   Please stop by lab before you go If you do not have mychart- we will call you about results within 5 business days of [place] receiving them.  If you have mychart- we will send your results within 3 business days of [place] receiving them.  If abnormal or we want to clarify a result, we will call or mychart you to make sure you receive the message.  If you have questions or concerns or don't hear within 5-7 days, please send [place] a message or call us.

## 2018-10-09 ENCOUNTER — Other Ambulatory Visit: Payer: Self-pay | Admitting: Family Medicine

## 2018-10-09 DIAGNOSIS — I10 Essential (primary) hypertension: Secondary | ICD-10-CM

## 2018-11-01 ENCOUNTER — Other Ambulatory Visit: Payer: Self-pay | Admitting: Obstetrics and Gynecology

## 2018-11-01 DIAGNOSIS — E2839 Other primary ovarian failure: Secondary | ICD-10-CM

## 2018-11-04 LAB — HM MAMMOGRAPHY

## 2018-11-13 ENCOUNTER — Encounter: Payer: Self-pay | Admitting: Family Medicine

## 2018-11-15 ENCOUNTER — Encounter: Payer: Self-pay | Admitting: Family Medicine

## 2018-12-12 ENCOUNTER — Ambulatory Visit
Admission: RE | Admit: 2018-12-12 | Discharge: 2018-12-12 | Disposition: A | Discharge status: Home / Self Care | Payer: Commercial Managed Care - PPO | Source: Ambulatory Visit | Attending: Obstetrics and Gynecology | Admitting: Obstetrics and Gynecology

## 2018-12-12 ENCOUNTER — Other Ambulatory Visit: Payer: Self-pay

## 2018-12-12 DIAGNOSIS — E2839 Other primary ovarian failure: Secondary | ICD-10-CM

## 2019-01-29 ENCOUNTER — Other Ambulatory Visit: Payer: Self-pay

## 2019-01-30 ENCOUNTER — Ambulatory Visit: Payer: Commercial Managed Care - PPO | Admitting: Family Medicine

## 2019-01-30 ENCOUNTER — Encounter: Payer: Self-pay | Admitting: Family Medicine

## 2019-01-30 ENCOUNTER — Other Ambulatory Visit: Payer: Self-pay

## 2019-01-30 ENCOUNTER — Ambulatory Visit (INDEPENDENT_AMBULATORY_CARE_PROVIDER_SITE_OTHER): Payer: Commercial Managed Care - PPO | Admitting: Family Medicine

## 2019-01-30 VITALS — BP 138/78 | HR 89 | Ht 71.0 in | Wt 229.2 lb

## 2019-01-30 DIAGNOSIS — M7918 Myalgia, other site: Secondary | ICD-10-CM | POA: Diagnosis not present

## 2019-01-30 NOTE — Patient Instructions (Addendum)
Thank you for coming in today. I think the problem is weakness and likely tendonitis of the piriformis muscle and gluteus medius muscle. Work on exercises reviewed by [employer]. If not making significant improvement in 2 weeks let me know and I will order physical therapy. Also consider offloading pressure by cutting a hole in a piece of foam to sit on. Consider heat as well.  Recheck in 4 weeks.  Please perform the exercise program that we have prepared for you and gone over in detail on a daily basis.  In addition to the handout you were provided you can access your program through: [email or web address]   Your unique program code is:  Nicole Campos moving!  Dr. Clovis Riley new office will be located at [address] on the 1st floor.  This location is across the street from the [employer] and in the same complex as the [facility] and [facility]  Our new office phone number will be [phone].  We anticipate beginning to see patients at the [facility] office in early December 2020.

## 2019-01-30 NOTE — Progress Notes (Signed)
I, Wendy Poet, LAT, ATC, am serving as scribe for Dr. Lynne Leader.  Nicole Campos is a 58 y.o. female who presents to [place] today for c/o L buttocks and lateral/posterior hip pain x several months w/ no known MOI.  Pt was previously seen by Dr. Paulla Fore on 03/18/18 for L shoulder pain and is presenting today w/ a new complaint.  Pt sees a chiropractor regularly who has noted that pt sits to the L.  She has been stretching recently and did feel like she over-stretched her L hip/glute.  Her pain is located in her L glute that refers into the L post-lateral thigh.  Pt rates her pain as 5/10 on average and a 6-7/10 at it's worst.  Her glute pain is described as a burning pain and the thigh pain is more of a thightness.  No pain radiating below the level of the knee.  No weakness or numbness.  She denies any low back pain or numbness/tingling into her L LE.  She has tried lower body stretching, BioFreeze and Advil PM.    ROS:  As above  Exam:  BP 138/78 (BP Location: Left Arm, Patient Position: Sitting, Cuff Size: Large)   Pulse 89   Ht 5\' 11"  (1.803 m)   Wt 229 lb 3.2 oz (104 kg)   SpO2 98%   BMI 31.97 kg/m  Wt Readings from Last 5 Encounters:  01/30/19 229 lb 3.2 oz (104 kg)  09/25/18 227 lb (103 kg)  01/14/18 221 lb 12.8 oz (100.6 kg)  10/11/17 220 lb 3.2 oz (99.9 kg)  08/17/17 228 lb (103.4 kg)   General: Well Developed, well nourished, and in no acute distress.  Neuro/Psych: Alert and oriented x3, extra-ocular muscles intact, able to move all 4 extremities, sensation grossly intact. Skin: Warm and dry, no rashes noted.  Respiratory: Not using accessory muscles, speaking in full sentences, trachea midline.  Cardiovascular: Pulses palpable, no extremity edema. Abdomen: Does not appear distended. MSK:  Left hip: Normal-appearing Range of motion: Flexion full. External rotation full. Internal rotation 5 degrees. Extension full.  Strength: Hip abduction  4/5.  Hip external rotation 4/5.  Internal rotation and adduction 5/5. Positive FADIR test. Negative FABER test. Pain with crossover stretch figure 4 stretch.  Not particularly tender greater trochanter.  Tender palpation at medial portion of posterior hip near the origin of the gluteus medius muscle.  Nontender ischial tuberosity or superior SI joint region.  Right hip: Normal-appearing Full range of motion. Strength 4+/5 abduction.  5/5 to external rotation internal rotation and adduction. No pain with FABER/FADIR test and crossover and figure-of-four stretch. Nontender.  Leg lengths equal.  Normal gait.    Lab and Radiology Results X-ray images hip and pelvis personally and independently reviewed from CT scan abdomen pelvis dated 2014.  No significant degenerative changes.  No lytic lesions.    Assessment and Plan: 58 y.o. female with  Left posterior hip/buttocks pain.  Pain ongoing for months.  Exam findings consistent for tenderness at origin of hip abductor or rotator tendons at medial posterior hip.  Additionally weak to hip abduction and external rotation.  Limited internal rotation range of motion. Differential includes hip abductor tendinopathy, piriformis tendinopathy, SI joint margin pain.  Plan to proceed with home exercise program taught by ATC.  Focus on strengthening and stretching hip abductor and rotators.  If not improving in 2 weeks patient will notify me and we will proceed with referral to formal physical therapy.  Patient  declined immediate referral to physical therapy today as well as imaging today.  Plan to recheck back in 4 weeks.    Historical information moved to improve visibility of documentation.  Past Medical History:  Diagnosis Date  . Arthritis    cervical, lumbar, knees, feet. mild hands  . Headache(784.0)    Hx: of occasinal Migraine  . Heart palpitations    Hx: of  . Hypertension   . PONV (postoperative nausea and vomiting)    Past  Surgical History:  Procedure Laterality Date  . APPENDECTOMY    . CESAREAN SECTION  1990  . CHOLECYSTECTOMY N/A 02/28/2013   Procedure: LAPAROSCOPIC CHOLECYSTECTOMY;  Surgeon: Axel Filler, MD;  Location: [facility];  Service: General;  Laterality: N/A;  . COLONOSCOPY W/ POLYPECTOMY     Hx: of  . KNEE SURGERY     Left knee - meniscus tear  . LAPAROSCOPIC APPENDECTOMY N/A 08/05/2012   Procedure: APPENDECTOMY LAPAROSCOPIC;  Surgeon: Clovis Pu. Cornett, MD;  Location: [facility];  Service: General;  Laterality: N/A;  . wisdom teeth     Social History   Tobacco Use  . Smoking status: Never Smoker  . Smokeless tobacco: Never Used  Substance Use Topics  . Alcohol use: Yes    Alcohol/week: 3.0 standard drinks    Types: 3 Standard drinks or equivalent per week    Comment: occasional   family history includes Cancer - Colon in her brother; Heart attack in her paternal grandfather and paternal grandmother; Hypertension in her mother; Lung cancer in her maternal grandfather; Pancreatic cancer in her father; Stroke in her maternal grandmother; Uterine cancer in her mother.  Medications: Current Outpatient Medications  Medication Sig Dispense Refill  . ibuprofen (ADVIL,MOTRIN) 200 MG tablet Take 400 mg by mouth 2 (two) times daily as needed for mild pain.    . metoprolol succinate (TOPROL-XL) 50 MG 24 hr tablet TAKE 1 TABLET BY MOUTH EVERY DAY WITH OR IMMEDIATELY FOLLOWING A MEAL 91 tablet 1   No current facility-administered medications for this visit.    Allergies  Allergen Reactions  . Penicillins Itching    Pt states this may be an error and that she "was given a derivative" of PCN with no reactions at all.      Discussed warning signs or symptoms. Please see discharge instructions. Patient expresses understanding.  The above documentation has been reviewed and is accurate and complete Clementeen Graham

## 2019-02-28 ENCOUNTER — Ambulatory Visit: Payer: Commercial Managed Care - PPO | Admitting: Family Medicine

## 2019-09-06 ENCOUNTER — Other Ambulatory Visit: Payer: Self-pay | Admitting: Family Medicine

## 2019-09-06 DIAGNOSIS — I10 Essential (primary) hypertension: Secondary | ICD-10-CM

## 2019-09-26 ENCOUNTER — Other Ambulatory Visit: Payer: Self-pay

## 2019-09-26 ENCOUNTER — Ambulatory Visit (INDEPENDENT_AMBULATORY_CARE_PROVIDER_SITE_OTHER): Payer: Commercial Managed Care - PPO | Admitting: Family Medicine

## 2019-09-26 ENCOUNTER — Encounter: Payer: Self-pay | Admitting: Family Medicine

## 2019-09-26 VITALS — BP 118/80 | HR 78 | Temp 98.2°F | Ht 71.0 in | Wt 204.2 lb

## 2019-09-26 DIAGNOSIS — E785 Hyperlipidemia, unspecified: Secondary | ICD-10-CM | POA: Diagnosis not present

## 2019-09-26 DIAGNOSIS — R42 Dizziness and giddiness: Secondary | ICD-10-CM

## 2019-09-26 DIAGNOSIS — R739 Hyperglycemia, unspecified: Secondary | ICD-10-CM

## 2019-09-26 DIAGNOSIS — M1990 Unspecified osteoarthritis, unspecified site: Secondary | ICD-10-CM

## 2019-09-26 DIAGNOSIS — Z0000 Encounter for general adult medical examination without abnormal findings: Secondary | ICD-10-CM | POA: Diagnosis not present

## 2019-09-26 DIAGNOSIS — G629 Polyneuropathy, unspecified: Secondary | ICD-10-CM

## 2019-09-26 DIAGNOSIS — I10 Essential (primary) hypertension: Secondary | ICD-10-CM

## 2019-09-26 DIAGNOSIS — R3129 Other microscopic hematuria: Secondary | ICD-10-CM | POA: Diagnosis not present

## 2019-09-26 DIAGNOSIS — M79676 Pain in unspecified toe(s): Secondary | ICD-10-CM

## 2019-09-26 LAB — COMPREHENSIVE METABOLIC PANEL WITH GFR
ALT: 13 U/L (ref 0–35)
AST: 12 U/L (ref 0–37)
Albumin: 4.7 g/dL (ref 3.5–5.2)
Alkaline Phosphatase: 61 U/L (ref 39–117)
BUN: 15 mg/dL (ref 6–23)
CO2: 31 meq/L (ref 19–32)
Calcium: 10 mg/dL (ref 8.4–10.5)
Chloride: 102 meq/L (ref 96–112)
Creatinine, Ser: 0.71 mg/dL (ref 0.40–1.20)
GFR: 83.57 mL/min
Glucose, Bld: 84 mg/dL (ref 70–99)
Potassium: 4 meq/L (ref 3.5–5.1)
Sodium: 139 meq/L (ref 135–145)
Total Bilirubin: 0.6 mg/dL (ref 0.2–1.2)
Total Protein: 6.8 g/dL (ref 6.0–8.3)

## 2019-09-26 LAB — CBC WITH DIFFERENTIAL/PLATELET
Basophils Absolute: 0 10*3/uL (ref 0.0–0.1)
Basophils Relative: 0.4 % (ref 0.0–3.0)
Eosinophils Absolute: 0.1 10*3/uL (ref 0.0–0.7)
Eosinophils Relative: 2.1 % (ref 0.0–5.0)
HCT: 41.3 % (ref 36.0–46.0)
Hemoglobin: 14.3 g/dL (ref 12.0–15.0)
Lymphocytes Relative: 24.7 % (ref 12.0–46.0)
Lymphs Abs: 1.2 10*3/uL (ref 0.7–4.0)
MCHC: 34.6 g/dL (ref 30.0–36.0)
MCV: 95 fl (ref 78.0–100.0)
Monocytes Absolute: 0.4 10*3/uL (ref 0.1–1.0)
Monocytes Relative: 8.8 % (ref 3.0–12.0)
Neutro Abs: 3.2 10*3/uL (ref 1.4–7.7)
Neutrophils Relative %: 64 % (ref 43.0–77.0)
Platelets: 307 10*3/uL (ref 150.0–400.0)
RBC: 4.35 Mil/uL (ref 3.87–5.11)
RDW: 12.5 % (ref 11.5–15.5)
WBC: 5.1 10*3/uL (ref 4.0–10.5)

## 2019-09-26 LAB — LIPID PANEL
Cholesterol: 242 mg/dL — ABNORMAL HIGH (ref 0–200)
HDL: 70.9 mg/dL (ref >39.00)
LDL Cholesterol: 158 mg/dL — ABNORMAL HIGH (ref 0–99)
NonHDL: 171.28
Total CHOL/HDL Ratio: 3
Triglycerides: 68 mg/dL (ref 0.0–149.0)
VLDL: 13.6 mg/dL (ref 0.0–40.0)

## 2019-09-26 LAB — URINALYSIS, MICROSCOPIC ONLY
RBC / HPF: NONE SEEN (ref 0–?)
WBC, UA: NONE SEEN (ref 0–?)

## 2019-09-26 MED ORDER — SALEX 6 % CREAM EX KIT: PACK | CUTANEOUS | 0 refills | Status: DC

## 2019-09-26 NOTE — Progress Notes (Signed)
Phone [phone]   Subjective:  Patient presents today for their annual physical. Chief complaint-noted.   See problem oriented charting- Review of Systems  Constitutional: Negative for chills and fever.  HENT: Negative for hearing loss and tinnitus.   Eyes: Negative for blurred vision and double vision.  Respiratory: Negative for cough and shortness of breath.   Cardiovascular: Negative for chest pain and palpitations.  Gastrointestinal: Negative for constipation, diarrhea, nausea and vomiting.  Genitourinary: Negative for dysuria and frequency.  Musculoskeletal: Positive for joint pain (hip and right shoulder). Negative for neck pain.  Skin: Negative for itching and rash.  Neurological: Negative for headaches.       Vertigo ongoing issues  Endo/Heme/Allergies: Negative for polydipsia. Does not bruise/bleed easily.  Psychiatric/Behavioral: Negative for hallucinations, substance abuse and suicidal ideas.   The following were reviewed and entered/updated in epic: Past Medical History:  Diagnosis Date  . Arthritis    cervical, lumbar, knees, feet. mild hands  . Headache(784.0)    Hx: of occasinal Migraine  . Heart palpitations    Hx: of  . Hypertension   . PONV (postoperative nausea and vomiting)    Patient Active Problem List   Diagnosis Date Noted  . Hyperlipidemia 07/19/2017    Priority: Medium  . Hyperglycemia 07/19/2017    Priority: Medium  . Palpitations 05/28/2017    Priority: Medium  . Vertigo 05/28/2017    Priority: Medium  . Hypertension 05/02/2016    Priority: Medium  . Diffuse pain 05/02/2016    Priority: Medium  . Arthritis     Priority: Medium  . Neuropathy 09/25/2018    Priority: Low  . Adhesive capsulitis of left shoulder 01/30/2017    Priority: Low  . Family history of colon cancer 05/02/2016    Priority: Low  . Microscopic hematuria 10/16/2011    Priority: Low   Past Surgical History:  Procedure Laterality Date  . APPENDECTOMY    .  CESAREAN SECTION  1990  . CHOLECYSTECTOMY N/A 02/28/2013   Procedure: LAPAROSCOPIC CHOLECYSTECTOMY;  Surgeon: Ralene Ok, MD;  Location: [facility];  Service: General;  Laterality: N/A;  . COLONOSCOPY W/ POLYPECTOMY     Hx: of  . KNEE SURGERY     Left knee - meniscus tear  . LAPAROSCOPIC APPENDECTOMY N/A 08/05/2012   Procedure: APPENDECTOMY LAPAROSCOPIC;  Surgeon: Joyice Faster. Cornett, MD;  Location: [facility];  Service: General;  Laterality: N/A;  . wisdom teeth      Family History  Problem Relation Age of Onset  . Uterine cancer Mother   . Hypertension Mother   . Pancreatic cancer Father   . Cancer - Colon Brother   . Stroke Maternal Grandmother         late 28s  . Lung cancer Maternal Grandfather   . Heart attack Paternal Grandmother        late 92s  . Heart attack Paternal Grandfather        died 80    Medications- reviewed and updated Current Outpatient Medications  Medication Sig Dispense Refill  . metoprolol succinate (TOPROL-XL) 50 MG 24 hr tablet TAKE 1 TABLET BY MOUTH EVERY DAY WITH OR IMMEDIATELY FOLLOWING A MEAL 91 tablet 1   No current facility-administered medications for this visit.    Allergies-reviewed and updated Allergies  Allergen Reactions  . Penicillins Itching    Pt states this may be an error and that she "was given a derivative" of PCN with no reactions at all.    Social History  Social History Narrative   Married 1980. Marcello Moores is husband Water engineer).3 kids Anola Gurney. Works at [employer] lives with parents, Jac Canavan single living with parents works at [employer], Music therapist works at [employer] and in school in [place] living with parents.       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 Oropharynx normal Neck: no thyromegaly CV: RRR no murmurs rubs or gallops Lungs: CTAB no crackles, wheeze, rhonchi Abdomen: soft/nontender/nondistended/normal bowel sounds. No rebound or guarding.  Ext: no edema Skin: warm, dry Neuro: grossly normal, moves all extremities, PERRLA   Assessment and Plan   59 y.o. female presenting for annual physical.  Health Maintenance counseling: 1. Anticipatory guidance: Patient counseled regarding regular dental exams -q6 months, eye exams - yearly,  avoiding smoking and second hand smoke , limiting alcohol to 1 beverage per day .   2. Risk factor reduction:  Advised patient of need for regular exercise and diet rich and fruits and vegetables to reduce risk of heart attack and stroke. Exercise- 3 days a week- mostly swimming. Fall will do more walking. Diet-improved diet recently- very cleanr .  Down 25 lbs from November!  Wt Readings from Last 3 Encounters:  09/26/19 204 lb 4 oz (92.6 kg)  01/30/19 229 lb 3.2 oz (104 kg)  09/25/18 227 lb (103 kg)  3. Immunizations/screenings/ancillary studies-discussed Shingrix- wants to hold for now. Has had shingrix in past.   Immunization History  Administered Date(s) Administered  . Influenza,inj,Quad PF,6+ Mos 05/02/2016  . Influenza-Unspecified 02/13/2019  . Janssen (J&J) SARS-COV-2 Vaccination 06/19/2019  . Tdap 05/02/2016   4. Cervical cancer screening- follows with Dr. Philis Pique. Pap May 2019 was normal on record-we will need to rerequest records next year 5. Breast cancer screening-  breast exam with Dr. Philis Pique of GYN and mammogram 11/04/2018 6. Colon cancer screening -  01/07/2019 with 5 year follow up due to brothers history- Dr. Paulita Fujita 7. Skin cancer screening-skin surgery center yearly.advised regular sunscreen use. Denies worrisome, changing, or new skin lesions.  8. Birth control/STD  check-monogamous/postmenopausal 9. Osteoporosis screening at 63-had at gyn- we will try to get records.  -Never smoker  Status of chronic or acute concerns   #Social update-still working from home. Daughter working for same company. [place] in [place]. Doing some pool now.   #Right shoulder pain- history of left frozen shoulder- that was doing ok. 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 She has tried exercises but they increase pain. She is going to try to get into DR. Paulla Fore and if that doesn't work [employer]- we can refer back to Dr. Georgina Snell   #Hip pain-doing better with swimming but worsens the shoulder issues. Also laying on hips certain can trigger  # also wakes up in the morning feels like a sunburn sensation in the inside- stopped taking advil pm wondering if that was related. Has only come off for a week. More related to if she overdoes it. Had an antiinflammatory concoction from chiropractor with turmeric in past nad thinks symptoms were better- may retrial   #  Hypertension-remained on metoprolol 50 mg extended release-well-controlled today  #Hyperlipidemia-mild issues, update lipid panel.  #Hyperglycemia-has had some high CBGs in the past. A1c was not elevated-trend CBG alone today  Lab Results  Component Value Date   HGBA1C 5.1 09/25/2018   #Neuropathy in her feet-has had nerve conduction studies in the past. Unclear cause. Started after knee surgery. She was wondering if it was related to her calluses-prior cream that she had used in the past last year to see if they will be effective and she reports it was salicylic acid 6% which she brought in today- reports this helped- we will retrial  #Constipation-better since eating healthier. 1 day a week does apples all day (apples with peanut butter or with cheese)  #Vertigo- years and years of issues. has not been doing great lately- has to be very cautious. cannot do a flip turn" related to this. Follows with  chiropractor. Offered vestibular rehab if needed-she did not tolerate home exercises- she is anxious about reproducing.   #Arthritis-takes Advil PM in the evening in the past- has stopped this. We may trial turmeric.   #History microscopic hematuria-update urine microscopic exam today. No RBCs in 2019   # intermittent toe pain- has a split in nail- wants to reestablish with podiatry- referral was placed today  Recommended follow up:  1 year physical Lab/Order associations: fasting   ICD-10-CM   1. Preventative health care  Z00.00 CBC with Differential/Platelet    Comprehensive metabolic panel    Lipid panel    Urine Microscopic  2. Essential hypertension  I10 CBC with Differential/Platelet    Comprehensive metabolic panel  3. Hyperlipidemia, unspecified hyperlipidemia type  E78.5 CBC with Differential/Platelet    Comprehensive metabolic panel    Lipid panel  4. Hyperglycemia  R73.9 Comprehensive metabolic panel  5. Arthritis  M19.90   6. Vertigo  R42   7. Neuropathy  J33.5 Salicylic Acid-Cleanser (SALEX) 6 % (Cream) KIT  8. Microscopic hematuria  R31.29 Urine Microscopic  9. Pain of toe, unspecified laterality  M79.676 Ambulatory referral to Podiatry    No orders of the defined types were placed in this encounter.   Return precautions advised.  Garret Reddish, MD

## 2019-09-26 NOTE — Patient Instructions (Addendum)
Shoulder Pain/ Hip pain:  if you are not able to get in with Dr. Berline Chough due to insurance let our office know so that we can set you up with Dr. Denyse Amass.  You may want to pick up turmeric and try and see if any improvement.    Blood pressure: looks good today keep up the good work.  Check at home and if over 140/90 consistently give our office a call.   We have called in a refill on the cream for your feet. Let us know if this helps.   Vertigo: If you decide to try the therapy let our office know.   If you have any questions or concerns before your next scheduled appointment please give our office a call.   Continue diet and exercise. Congratulations  on weight loss.   If you decide to get the Shingles vaccination let our office know we can make a nurse visit for that.   We will call you within two weeks about your referral to Podiatry. If you do not hear within 3 weeks, give [place] a call.   Please stop by lab before you go  If you have mychart- we will send your results within 3 business days of [place] receiving them.  If you do not have mychart- we will call you about results within 5 business days of [place] receiving them.

## 2019-09-29 ENCOUNTER — Telehealth: Payer: Self-pay | Admitting: Family Medicine

## 2019-09-29 NOTE — Telephone Encounter (Signed)
Spoke to pt told her I called [facility] and they did receive the Rx but, it was not covered by insurance because you can buy it OTC. Pt verbalized understanding. Told pt there are numerous facial cleanser with that it she can check with the pharmacy and see what they recommend. Told her Dr. Jimmey Ralph said Clearasil is one of them. Pt verbalized understanding.

## 2019-09-29 NOTE — Telephone Encounter (Signed)
Pt called following up on medication that was sent in on Friday. Pt states she called her pharmacy and the prescription was not there.   Prescription: salicylic acid-cleanser 6 %

## 2019-09-29 NOTE — Telephone Encounter (Signed)
[facility] pharmacy and spoke to [place] and she said they did receive Rx but was not covered by insurance due to can get OTC . Told her okay thank you.

## 2019-10-08 ENCOUNTER — Ambulatory Visit: Payer: Self-pay | Admitting: Podiatry

## 2019-12-19 ENCOUNTER — Other Ambulatory Visit: Payer: Self-pay | Admitting: Family Medicine

## 2019-12-19 DIAGNOSIS — I10 Essential (primary) hypertension: Secondary | ICD-10-CM

## 2020-06-10 ENCOUNTER — Other Ambulatory Visit: Payer: Self-pay | Admitting: Family Medicine

## 2020-06-10 DIAGNOSIS — I10 Essential (primary) hypertension: Secondary | ICD-10-CM

## 2020-07-16 ENCOUNTER — Other Ambulatory Visit: Payer: Self-pay

## 2020-07-16 ENCOUNTER — Ambulatory Visit (HOSPITAL_BASED_OUTPATIENT_CLINIC_OR_DEPARTMENT_OTHER)
Admission: RE | Admit: 2020-07-16 | Discharge: 2020-07-16 | Disposition: A | Discharge status: Home / Self Care | Payer: Commercial Managed Care - PPO | Source: Ambulatory Visit | Attending: Sports Medicine | Admitting: Sports Medicine

## 2020-07-16 ENCOUNTER — Other Ambulatory Visit (HOSPITAL_BASED_OUTPATIENT_CLINIC_OR_DEPARTMENT_OTHER): Payer: Self-pay | Admitting: Sports Medicine

## 2020-07-16 DIAGNOSIS — M25511 Pain in right shoulder: Secondary | ICD-10-CM | POA: Insufficient documentation

## 2020-09-28 ENCOUNTER — Encounter: Payer: Commercial Managed Care - PPO | Admitting: Family Medicine

## 2020-10-04 ENCOUNTER — Telehealth: Payer: Self-pay

## 2020-10-04 ENCOUNTER — Encounter: Payer: Self-pay | Admitting: Family Medicine

## 2020-10-04 ENCOUNTER — Other Ambulatory Visit: Payer: Self-pay

## 2020-10-04 ENCOUNTER — Ambulatory Visit (INDEPENDENT_AMBULATORY_CARE_PROVIDER_SITE_OTHER): Payer: Commercial Managed Care - PPO | Admitting: Family Medicine

## 2020-10-04 VITALS — BP 120/82 | HR 86 | Temp 98.6°F | Ht 71.0 in | Wt 219.4 lb

## 2020-10-04 DIAGNOSIS — I10 Essential (primary) hypertension: Secondary | ICD-10-CM | POA: Diagnosis not present

## 2020-10-04 DIAGNOSIS — Z0000 Encounter for general adult medical examination without abnormal findings: Secondary | ICD-10-CM

## 2020-10-04 DIAGNOSIS — R42 Dizziness and giddiness: Secondary | ICD-10-CM

## 2020-10-04 DIAGNOSIS — E785 Hyperlipidemia, unspecified: Secondary | ICD-10-CM | POA: Diagnosis not present

## 2020-10-04 DIAGNOSIS — R739 Hyperglycemia, unspecified: Secondary | ICD-10-CM | POA: Diagnosis not present

## 2020-10-04 DIAGNOSIS — G629 Polyneuropathy, unspecified: Secondary | ICD-10-CM

## 2020-10-04 LAB — COMPREHENSIVE METABOLIC PANEL
ALT: 15 U/L (ref 0–35)
AST: 14 U/L (ref 0–37)
Albumin: 4.3 g/dL (ref 3.5–5.2)
Alkaline Phosphatase: 61 U/L (ref 39–117)
BUN: 17 mg/dL (ref 6–23)
CO2: 26 mEq/L (ref 19–32)
Calcium: 9.7 mg/dL (ref 8.4–10.5)
Chloride: 105 mEq/L (ref 96–112)
Creatinine, Ser: 0.63 mg/dL (ref 0.40–1.20)
GFR: 95.06 mL/min (ref >60.00)
Glucose, Bld: 89 mg/dL (ref 70–99)
Potassium: 4.1 mEq/L (ref 3.5–5.1)
Sodium: 140 mEq/L (ref 135–145)
Total Bilirubin: 0.7 mg/dL (ref 0.2–1.2)
Total Protein: 6.8 g/dL (ref 6.0–8.3)

## 2020-10-04 LAB — CBC WITH DIFFERENTIAL/PLATELET
Basophils Absolute: 0 10*3/uL (ref 0.0–0.1)
Basophils Relative: 0.4 % (ref 0.0–3.0)
Eosinophils Absolute: 0.1 10*3/uL (ref 0.0–0.7)
Eosinophils Relative: 3 % (ref 0.0–5.0)
HCT: 40.7 % (ref 36.0–46.0)
Hemoglobin: 13.9 g/dL (ref 12.0–15.0)
Lymphocytes Relative: 25 % (ref 12.0–46.0)
Lymphs Abs: 1.1 10*3/uL (ref 0.7–4.0)
MCHC: 34.2 g/dL (ref 30.0–36.0)
MCV: 91.6 fl (ref 78.0–100.0)
Monocytes Absolute: 0.4 10*3/uL (ref 0.1–1.0)
Monocytes Relative: 9.4 % (ref 3.0–12.0)
Neutro Abs: 2.8 10*3/uL (ref 1.4–7.7)
Neutrophils Relative %: 62.2 % (ref 43.0–77.0)
Platelets: 272 10*3/uL (ref 150.0–400.0)
RBC: 4.44 Mil/uL (ref 3.87–5.11)
RDW: 12.6 % (ref 11.5–15.5)
WBC: 4.5 10*3/uL (ref 4.0–10.5)

## 2020-10-04 LAB — LIPID PANEL
Cholesterol: 201 mg/dL — ABNORMAL HIGH (ref 0–200)
HDL: 67.5 mg/dL (ref >39.00)
LDL Cholesterol: 119 mg/dL — ABNORMAL HIGH (ref 0–99)
NonHDL: 133.23
Total CHOL/HDL Ratio: 3
Triglycerides: 73 mg/dL (ref 0.0–149.0)
VLDL: 14.6 mg/dL (ref 0.0–40.0)

## 2020-10-04 NOTE — Patient Instructions (Addendum)
Health Maintenance Due  Topic Date Due   Zoster Vaccines- Shingrix (1 of 2)- would consider- can call back to schedule nurse visit Never done   COVID-19 Vaccine (3 - Booster for Linwood Dibbles series)- would consider 07/15/2020   Sign release of information at the check out desk for last mammogram  Goal on home readings to stay on current medicine <135/85  Please stop by lab before you go If you have mychart- we will send your results within 3 business days of [place] receiving them.  If you do not have mychart- we will call you about results within 5 business days of [place] receiving them.  *please also note that you will see labs on mychart as soon as they post. I will later go in and write notes on them- will say "notes from Dr. Durene Cal"  Recommended follow up: Return in about 1 year (around 10/04/2021) for physical or sooner if needed.

## 2020-10-04 NOTE — Telephone Encounter (Signed)
Patient is returning phone call about lab results.  

## 2020-10-04 NOTE — Progress Notes (Addendum)
Phone [phone]   Subjective:  Patient presents today for their annual physical. Chief complaint-noted.   See problem oriented charting- ROS- full  review of systems was completed and negative except for: light sensitivity was doing better working from home but worse now with lights in office, headache pattern stable  The following were reviewed and entered/updated in epic: Past Medical History:  Diagnosis Date   Arthritis    cervical, lumbar, knees, feet. mild hands   Headache(784.0)    Hx: of occasinal Migraine   Heart palpitations    Hx: of   Hypertension    PONV (postoperative nausea and vomiting)    Patient Active Problem List   Diagnosis Date Noted   Hyperlipidemia 07/19/2017    Priority: Medium   Hyperglycemia 07/19/2017    Priority: Medium   Palpitations 05/28/2017    Priority: Medium   Vertigo 05/28/2017    Priority: Medium   Hypertension 05/02/2016    Priority: Medium   Diffuse pain 05/02/2016    Priority: Medium   Arthritis     Priority: Medium   Neuropathy 09/25/2018    Priority: Low   Adhesive capsulitis of left shoulder 01/30/2017    Priority: Low   Family history of colon cancer 05/02/2016    Priority: Low   Microscopic hematuria 10/16/2011    Priority: Low   Past Surgical History:  Procedure Laterality Date   APPENDECTOMY     CESAREAN SECTION  1990   CHOLECYSTECTOMY N/A 02/28/2013   Procedure: LAPAROSCOPIC CHOLECYSTECTOMY;  Surgeon: Axel Filler, MD;  Location: [facility] OR;  Service: General;  Laterality: N/A;   COLONOSCOPY W/ POLYPECTOMY     Hx: of   KNEE SURGERY     Left knee - meniscus tear   LAPAROSCOPIC APPENDECTOMY N/A 08/05/2012   Procedure: APPENDECTOMY LAPAROSCOPIC;  Surgeon: Clovis Pu. Cornett, MD;  Location: [facility];  Service: General;  Laterality: N/A;   wisdom teeth      Family History  Problem Relation Age of Onset   Uterine cancer Mother    Hypertension Mother    Pancreatic cancer Father    Cancer - Colon Brother     Stroke Maternal Grandmother         late 30s   Lung cancer Maternal Grandfather    Heart attack Paternal Grandmother        late 47s   Heart attack Paternal Grandfather        died 33    Medications- reviewed and updated Current Outpatient Medications  Medication Sig Dispense Refill   gabapentin (NEURONTIN) 300 MG capsule Take 300 mg by mouth 3 (three) times daily.     metoprolol succinate (TOPROL-XL) 50 MG 24 hr tablet TAKE 1 TABLET BY MOUTH EVERY DAY WITH OR IMMEDIATELY FOLLOWING A MEAL 91 tablet 1   Vitamin D, Cholecalciferol, 25 MCG (1000 UT) CAPS Take 1 capsule by mouth daily in the afternoon.     No current facility-administered medications for this visit.    Allergies-reviewed and updated Allergies  Allergen Reactions   Penicillins Itching    Pt states this may be an error and that she "was given a derivative" of PCN with no reactions at all.    Social History   Social History Narrative   Married 1980. [name] is husband Marketing executive).3 kids Lavon Paganini. Works at [employer] lives with parents, Wandalee Ferdinand single living with parents works at [employer], Psychologist, counselling works at [employer] and in school in [place] living with parents.  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 Oropharynx normal Neck: no thyromegaly CV: RRR no murmurs rubs or gallops Lungs: CTAB no crackles, wheeze, rhonchi Abdomen: soft/nontender/nondistended/normal bowel sounds. No rebound or guarding.  Ext: no edema Skin: warm, dry Neuro: grossly normal,  moves all extremities, PERRLA   Assessment and Plan   60 y.o. female presenting for annual physical.  Health Maintenance counseling: 1. Anticipatory guidance: Patient counseled regarding regular dental exams -q6 months, eye exams - yearly,  avoiding smoking and second hand smoke, limiting alcohol to 1 beverage per day .   2. Risk factor reduction:  Advised patient of need for regular exercise and diet rich and fruits and vegetables to reduce risk of heart attack and stroke. Exercise- 2 days a week in the pool and trying to get her steps in.  Does more walking in the fall. Does some stretching at home. Diet-had improved her diet last year and was down 25 pounds-this year up 15 lbs- pain is a trigger for eating- we are hoping shoulder issue will improve ot help her with this .  3. Immunizations/screenings/ancillary studies-patient has had shingles in the past and wants to hold off on Shingrix- declines for now, discussed mRNA COVID vaccination- she has had one MRNA and done well- now 6 months out could consider repeat- she wants to hold off for now Immunization History  Administered Date(s) Administered   Influenza,inj,Quad PF,6+ Mos 05/02/2016   Influenza-Unspecified 02/13/2019   Janssen (J&J) SARS-COV-2 Vaccination 06/19/2019   PFIZER(Purple Top)SARS-COV-2 Vaccination 03/17/2020   Tdap 05/02/2016  4. Cervical cancer screening- follows with Dr. 05/04/2016.  Pap 07/20/2017 was HPV negative-not due for 5 years-we will have team update abstraction. She also believes had one this year.  5. Breast cancer screening-  breast exam with Dr. 09/19/2017 and mammogram -recently will get records 6. Colon cancer screening - 01/07/2019 with 5-year follow-up due to brother's history-follows with Dr. 01/09/2019 7. Skin cancer screening-  sees dermatology skin surgery center- considering changing. advised regular sunscreen use. Denies worrisome, changing, or new skin lesions.  8. Birth control/STD check-  monogamous/postmenopausal 9. Osteoporosis screening at 78- -had with gynecology and noted good report per patient  -Never smoker  Status of chronic or acute concerns   #hypertension/palpitations S: medication: metoprolol 50 mg XR Home readings #s: doesn't check at home - certain foods trigger palpitations- no chest pain or shortness of breath BP Readings from Last 3 Encounters:  10/04/20 120/82  09/26/19 118/80  01/30/19 138/78  A/P: Stable. Continue current medications.   #hyperlipidemia S: Medication:none -feels eats reasonably healthy Lab Results  Component Value Date   CHOL 242 (H) 09/26/2019   HDL 70.90 09/26/2019   LDLCALC 158 (H) 09/26/2019   TRIG 68.0 09/26/2019   CHOLHDL 3 09/26/2019   A/P: 10 year ascvd risk of 4.7% last year- recalculate but if under 7.5% which we suspect stay off medicine and continue to work on lifestyle changes  #Idiopathic neuropathy in her feet-history of nerve conduction studies in  the past with unclear cause but started after knee surgery. S: Medication: Gabapentin trial for feet helped this some  Salicylic acid 6% solution for calluses was actually helpful- trial 2021 was not helpful -also will get a burning sensation in her body at times- resolved on gabapentin A/P: reasonable control lately- continue current meds    #Vertigo-years and years of issues.  Cannot do a flip turn with swimming due to this.  Follows with chiropractor.  She has not tolerated home exercises and has declined vestibular rehab as concerned about triggering episodes- declines again today- makes her palms sweat just thinking about it -this continues to bother her- tries to move slow and this is helpful   #Right shoulder pain-patient with history of left shoulder frozen shoulder.  In 2021 and unfortunately her right shoulder was bothering her-plan was to get back in with Dr. Berline Chough if possible with insurance-otherwise to see Dr. Deatra James reports the right side continues to  bother her- using gabapentin for this and this has toned down nerve pain. Doing dry needling/PT- better overall but still dealing with long term pain - in pool but very cautious with swimming. Going back to see Dr. Berline Chough on Thursday- wants to avoid surgery.   #Hip pain-has done better with swimming in summer- doing stretching in .  Did have some trochanteric bursitis-worse with laying on the hips   #Constipation-has done better with healthier diet.  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   Recommended follow up: Return in about 1 year (around 10/04/2021) for physical or sooner if needed.  Lab/Order associations: fasting   ICD-10-CM   1. Preventative health care  Z00.00     2. Hyperlipidemia, unspecified hyperlipidemia type  E78.5     3. Primary hypertension  I10     4. Hyperglycemia  R73.9     5. Vertigo  R42     6. Neuropathy  G62.9      No orders of the defined types were placed in this encounter.  Return precautions advised.  Tana Conch, MD

## 2020-10-04 NOTE — Addendum Note (Signed)
Addended by: Shelva Majestic on: 10/04/2020 09:36 PM   Modules accepted: Level of Service

## 2020-10-05 NOTE — Telephone Encounter (Signed)
See result notes. 

## 2020-10-26 ENCOUNTER — Other Ambulatory Visit: Payer: Self-pay

## 2020-10-26 ENCOUNTER — Ambulatory Visit: Payer: Commercial Managed Care - PPO | Admitting: Physical Medicine and Rehabilitation

## 2020-10-26 ENCOUNTER — Encounter: Payer: Self-pay | Admitting: Physical Medicine and Rehabilitation

## 2020-10-26 VITALS — BP 118/76 | HR 92

## 2020-10-26 DIAGNOSIS — M5010 Cervical disc disorder with radiculopathy, unspecified cervical region: Secondary | ICD-10-CM

## 2020-10-26 DIAGNOSIS — G8929 Other chronic pain: Secondary | ICD-10-CM

## 2020-10-26 DIAGNOSIS — M7918 Myalgia, other site: Secondary | ICD-10-CM

## 2020-10-26 DIAGNOSIS — M25511 Pain in right shoulder: Secondary | ICD-10-CM | POA: Diagnosis not present

## 2020-10-26 DIAGNOSIS — M4802 Spinal stenosis, cervical region: Secondary | ICD-10-CM | POA: Diagnosis not present

## 2020-10-26 DIAGNOSIS — M5412 Radiculopathy, cervical region: Secondary | ICD-10-CM | POA: Diagnosis not present

## 2020-10-26 DIAGNOSIS — F411 Generalized anxiety disorder: Secondary | ICD-10-CM

## 2020-10-26 MED ORDER — DIAZEPAM 5 MG PO TABS: ORAL_TABLET | ORAL | 0 refills | Status: DC

## 2020-10-26 NOTE — Progress Notes (Signed)
Nicole Campos - 60 y.o. female MRN [id number]  Date of birth: Jan 06, 1961  Office Visit Note: Visit Date: 10/26/2020 PCP: Shelva Majestic, MD Referred by: Shelva Majestic, MD  Subjective: Chief Complaint  Patient presents with   Neck - Pain   Right Shoulder - Pain   HPI: Nicole Campos is a 60 y.o. female who comes in today At the request of Dr. Gaspar Bidding for new patient consultation and evaluation and management of prolonged severe worsening right shoulder pain with some component of neck pain and arm pain.  She tells me today and I have read all of Dr. Janeece Riggers notes which can be reviewed and are in the chart that she has had severe right shoulder pain now for several months even back to the first of the year.  She has had a history of adhesive capsulitis in both shoulders.  She has a diagnosed history of some neuropathy.  She reports no specific inciting injury but is felt like she has done things in the past to hurt the shoulder.  She describes current 9 out of 10 sharp stabbing pain in the right anterior shoulder with referral into the arm down to the forearm.  No specific paresthesia but does get an aching tooth ache feeling in the arm.  She does get some pain with movement of the shoulder.  She does get some neck pain and scapular pain as well.  She has minor left-sided complaints.  She has no associated headaches but has had headaches in general.  She does report a history of diffuse pain complaints in general over the years.  She reports and again Dr. Janeece Riggers notes confirm that she had medication management including anti-inflammatory and muscle relaxer as well as gabapentin.  She is on 600 mg of gabapentin 3 times a day at this point.  She gets mild relief if she takes some ibuprofen with that.  She can find periods at rest where its pain level is down to a 2.  She had focused physical therapy with dry needling without much relief.  The dry needling and manual treatment seem  to actually flareup the symptoms.  Dr. Hardin Negus felt like there may be some anxiety component to exacerbating the myofascial pain syndrome.  He really did not feel like her current pain was Lajoyce Corners her shoulder and did obtain MRI of the cervical spine.  She is also had OMT treatment by him without relief.  She is very frustrated at this point with ongoing pain for so long now.  MRI is reviewed with the patient.  This was done at [facility] but she did bring her CD with the images.  Main findings were at C5-6 and C6-7 where there was fairly significant disc osteophyte issues with bilateral foraminal narrowing and also central canal stenosis at C6-7.  No cord signal changes.  No symptoms of myelopathy.  Review of Systems  Musculoskeletal:  Positive for joint pain and neck pain.  All other systems reviewed and are negative. Otherwise per HPI.  Assessment & Plan: Visit Diagnoses:    ICD-10-CM   1. Cervical radiculopathy  M54.12     2. Spinal stenosis of cervical region  M48.02     3. Cervical disc disorder with radiculopathy  M50.10     4. Chronic right shoulder pain  M25.511    G89.29     5. Myofascial pain syndrome  M79.18     6. Pre-operative anxiety  F41.1  Plan: Findings:  Chronic and severe and recalcitrant neck and shoulder and arm pain for greater than 6 months despite appropriate conservative care with medications including anti-inflammatories and nerve membrane stabilizing medications as well as physical therapy with dry needling and OMT treatment.  MRI shows pretty significant cervical spine spondylitic change particularly at C5-6 and C6-7 with disc osteophyte as well as foraminal narrowing and central canal stenosis.  I do think the neck step would be diagnostic and hopefully therapeutic cervical epidural injection.  We discussed this at length with her and she does want proceed with that.  Injections would be done with fluoroscopic guidance and as part of a comprehensive pain management  approach.  She may end up needing surgical consultation although at some point she really does not want to think about at this point.  Exam is consistent with likely underlying myofascial pain syndrome and also likely some intrinsic shoulder issues as well.  She will continue to follow-up with Dr. Gaspar Bidding for her shoulder and myofascial pain.  I did write a prescription for preprocedure Valium for anxiety preprocedure.   Meds & Orders:  Meds ordered this encounter  Medications   diazepam (VALIUM) 5 MG tablet    Sig: Take 1 by mouth 1 hour  pre-procedure with very light food. May bring 2nd tablet to appointment.    Dispense:  2 tablet    Refill:  0   No orders of the defined types were placed in this encounter.   Follow-up: Return for Right C7-T1 interlaminar dural steroid injection..   Procedures: No procedures performed      Clinical History: INDICATION: Pain in right shoulder.  Neck pain. Pain radiates down the RIGHT arm.   COMPARISON: Radiographs performed on 09/29/2020.   TECHNIQUE: Multiplanar, multisequence MR imaging obtained through the cervical spine without intravenous administration of contrast on 10/19/2020 12:51 PM.   CONTRAST: None.   FINDINGS:  #  Osseous structures: Vertebral body heights are maintained. No acute fracture or concerning marrow signal abnormality.  #  Alignment: Alignment maintained.  #  Cervicomedullary junction: Unremarkable. No cerebellar tonsillar ectopia.  #  Spinal cord: No intrinsic spinal cord abnormality.   #  C2-C3: No significant disc herniation, spinal canal, or neuroforaminal compromise.  #  C3-C4: Facet ankylosing on the RIGHT. Mild uncovertebral disease on the RIGHT. This is producing mild RIGHT neuroforaminal stenosis. The LEFT neuroforamen and spinal canal are patent.  #  C4-C5: Bilateral uncovertebral disease producing mild RIGHT neuroforaminal stenosis. LEFT neuroforamen and spinal canal are patent.  #  C5-C6: Loss of disc height  with a posterior disc osteophytic complex and uncovertebral disease. This results in mild effacement of the thecal sac and moderate bilateral neuroforaminal stenosis.  #  C6-C7: Loss of disc height with a posterior disc osteophytic complex and uncovertebral disease. There is ligamentum flavum redundancy. This results in moderate spinal canal narrowing. Moderate LEFT and mild RIGHT neuroforaminal stenosis.  #  C7-T1: Posterior central disc protrusion producing effacement of the ventral thecal sac. Neuroforamen are patent.   #  Paraspinal tissues: Unremarkable   #  Additional comments: Multiple small nodules in the thyroid gland.    IMPRESSION:  1.  Cervical spondylosis and facet arthrosis as described above. This is most notable for moderate bilateral neuroforaminal stenosis and mild spinal canal narrowing at C5-C6; moderate spinal canal narrowing and moderate LEFT and mild RIGHT neuroforaminal stenosis at C6-C7.  2.  Multiple small nodules in the thyroid gland. Recommend correlation with prior  imaging or ultrasound for further characterization.   Electronically Signed by: Ardyth Man on 10/19/2020 2:23 PM   She reports that she has never smoked. She has never used smokeless tobacco. No results for input(s): HGBA1C, LABURIC in the last 8760 hours.  Objective:  VS:  HT:    WT:   BMI:     BP:118/76  HR:92bpm  TEMP: ( )  RESP:  Physical Exam Vitals and nursing note reviewed.  Constitutional:      General: She is not in acute distress.    Appearance: Normal appearance. She is obese. She is not ill-appearing.  HENT:     Head: Normocephalic and atraumatic.     Right Ear: External ear normal.     Left Ear: External ear normal.  Eyes:     Extraocular Movements: Extraocular movements intact.  Cardiovascular:     Rate and Rhythm: Normal rate.     Pulses: Normal pulses.  Musculoskeletal:     Cervical back: Tenderness present. No rigidity.     Right lower leg: No edema.     Left lower leg:  No edema.     Comments: Patient sits with forward flexed cervical spine.  She has some decreased range of motion with right rotation and it does elicit some neck pain.  Patient has good strength in the upper extremities including 5 out of 5 strength in wrist extension long finger flexion and APB.  There is no atrophy of the hands intrinsically.  There is a negative Hoffmann's test.  She has an equivocally positive Spurling's test to the right.  She does have some myofascial trigger points in the rhomboid and supraspinatus and levator scapula.  She actually does have some pain with external rotation and internal rotation of the right shoulder and it does reproduce a little bit of her pain but is not the same pain that she normally feels.  She actually has no pain on the left with rotation.   Lymphadenopathy:     Cervical: No cervical adenopathy.  Skin:    Findings: No erythema, lesion or rash.  Neurological:     General: No focal deficit present.     Mental Status: She is alert and oriented to person, place, and time.     Sensory: No sensory deficit.     Motor: No weakness or abnormal muscle tone.     Coordination: Coordination normal.     Gait: Gait normal.  Psychiatric:        Mood and Affect: Mood normal.        Behavior: Behavior normal.    Ortho Exam  Imaging: No results found.  Past Medical/Family/Surgical/Social History: Medications & Allergies reviewed per EMR, new medications updated. Patient Active Problem List   Diagnosis Date Noted   Neuropathy 09/25/2018   Hyperlipidemia 07/19/2017   Hyperglycemia 07/19/2017   Palpitations 05/28/2017   Vertigo 05/28/2017   Adhesive capsulitis of left shoulder 01/30/2017   Hypertension 05/02/2016   Diffuse pain 05/02/2016   Family history of colon cancer 05/02/2016   Arthritis    Microscopic hematuria 10/16/2011   Past Medical History:  Diagnosis Date   Arthritis    cervical, lumbar, knees, feet. mild hands   Headache(784.0)     Hx: of occasinal Migraine   Heart palpitations    Hx: of   Hypertension    PONV (postoperative nausea and vomiting)    Family History  Problem Relation Age of Onset   Uterine cancer Mother    Hypertension Mother  Pancreatic cancer Father    Cancer - Colon Brother    Stroke Maternal Grandmother         late 5390s   Lung cancer Maternal Grandfather    Heart attack Paternal Grandmother        late 880s   Heart attack Paternal Grandfather        died 455   Past Surgical History:  Procedure Laterality Date   APPENDECTOMY     CESAREAN SECTION  1990   CHOLECYSTECTOMY N/A 02/28/2013   Procedure: LAPAROSCOPIC CHOLECYSTECTOMY;  Surgeon: Axel FillerArmando Ramirez, MD;  Location: [facility];  Service: General;  Laterality: N/A;   COLONOSCOPY W/ POLYPECTOMY     Hx: of   KNEE SURGERY     Left knee - meniscus tear   LAPAROSCOPIC APPENDECTOMY N/A 08/05/2012   Procedure: APPENDECTOMY LAPAROSCOPIC;  Surgeon: Clovis Puhomas A. Cornett, MD;  Location: [facility];  Service: General;  Laterality: N/A;   wisdom teeth     Social History   Occupational History   Not on file  Tobacco Use   Smoking status: Never   Smokeless tobacco: Never  Substance and Sexual Activity   Alcohol use: Yes    Alcohol/week: 3.0 standard drinks    Types: 3 Standard drinks or equivalent per week    Comment: occasional   Drug use: No   Sexual activity: Not on file

## 2020-10-26 NOTE — Progress Notes (Signed)
Pt state neck pain that travels right shoulder. Pt state sitting it a dull ache, lifting things up and trying to put on cloths is painful. Pt state laying down at night and turning in bed can be painful. Pt state put on her seat belt is painful. Pt state she take pain meds to help ease her pain.  Numeric Pain Rating Scale and Functional Assessment Average Pain 9 Pain Right Now 2 My pain is intermittent, sharp, burning, dull, stabbing, and aching Pain is worse with: sitting, inactivity, and some activites Pain improves with: rest and medication   In the last MONTH (on 0-10 scale) has pain interfered with the following?  1. General activity like being  able to carry out your everyday physical activities such as walking, climbing stairs, carrying groceries, or moving a chair?  Rating(7)  2. Relation with others like being able to carry out your usual social activities and roles such as  activities at home, at work and in your community. Rating(8)  3. Enjoyment of life such that you have  been bothered by emotional problems such as feeling anxious, depressed or irritable?  Rating(9)

## 2020-11-09 ENCOUNTER — Ambulatory Visit: Payer: Self-pay

## 2020-11-09 ENCOUNTER — Ambulatory Visit: Payer: Commercial Managed Care - PPO | Admitting: Physical Medicine and Rehabilitation

## 2020-11-09 ENCOUNTER — Encounter: Payer: Self-pay | Admitting: Physical Medicine and Rehabilitation

## 2020-11-09 ENCOUNTER — Other Ambulatory Visit: Payer: Self-pay

## 2020-11-09 VITALS — BP 125/85 | HR 86

## 2020-11-09 DIAGNOSIS — M5412 Radiculopathy, cervical region: Secondary | ICD-10-CM

## 2020-11-09 MED ORDER — BETAMETHASONE SOD PHOS & ACET 6 (3-3) MG/ML IJ SUSP
12.0000 mg | Freq: Once | INTRAMUSCULAR | Status: AC
  Administered 2020-11-09: 12 mg

## 2020-11-09 NOTE — Progress Notes (Signed)
Pt state neck pain that travels to her right shoulder and arm. Pt state anything movement with her neck makes the pain worse. Pt state she takes pain meds to help ease her pain.  Numeric Pain Rating Scale and Functional Assessment Average Pain 5   In the last MONTH (on 0-10 scale) has pain interfered with the following?  1. General activity like being  able to carry out your everyday physical activities such as walking, climbing stairs, carrying groceries, or moving a chair?  Rating(8)   +Driver, -BT, -Dye Allergies.

## 2020-11-09 NOTE — Patient Instructions (Signed)

## 2020-11-27 NOTE — Progress Notes (Signed)
Nicole Campos - 60 y.o. female MRN [id number]  Date of birth: 07-01-1960  Office Visit Note: Visit Date: 11/09/2020 PCP: Shelva Majestic, MD Referred by: Shelva Majestic, MD  Subjective: Chief Complaint  Patient presents with   Neck - Pain   Right Shoulder - Pain   Right Arm - Pain   HPI:  Nicole Campos is a 60 y.o. female who comes in today for planned Right C7-T1 Cervical Interlaminar epidural steroid injection with fluoroscopic guidance.  The patient has failed conservative care including home exercise, medications, time and activity modification.  This injection will be diagnostic and hopefully therapeutic.  Please see requesting physician notes for further details and justification.   ROS Otherwise per HPI.  Assessment & Plan: Visit Diagnoses:    ICD-10-CM   1. Cervical radiculopathy  M54.12 XR C-ARM NO REPORT    Epidural Steroid injection    betamethasone acetate-betamethasone sodium phosphate (CELESTONE) injection 12 mg      Plan: No additional findings.   Meds & Orders:  Meds ordered this encounter  Medications   betamethasone acetate-betamethasone sodium phosphate (CELESTONE) injection 12 mg    Orders Placed This Encounter  Procedures   XR C-ARM NO REPORT   Epidural Steroid injection    Follow-up: Return if symptoms worsen or fail to improve.   Procedures: No procedures performed  Cervical Epidural Steroid Injection - Interlaminar Approach with Fluoroscopic Guidance  Patient: Nicole Campos      Date of Birth: [birth year] MRN: [id number] PCP: Shelva Majestic, MD      Visit Date: 11/09/2020   Universal Protocol:    Date/Time: 09/17/221:32 PM  Consent Given By: the patient  Position: PRONE  Additional Comments: Vital signs were monitored before and after the procedure. Patient was prepped and draped in the usual sterile fashion. The correct patient, procedure, and site was verified.   Injection Procedure Details:   Procedure  diagnoses: Cervical radiculopathy [M54.12]    Meds Administered:  Meds ordered this encounter  Medications   betamethasone acetate-betamethasone sodium phosphate (CELESTONE) injection 12 mg     Laterality: Right  Location/Site: C7-T1  Needle: 3.5 in., 20 ga. Tuohy  Needle Placement: Paramedian epidural space  Findings:  -Comments: Excellent flow of contrast into the epidural space.  Procedure Details: Using a paramedian approach from the side mentioned above, the region overlying the inferior lamina was localized under fluoroscopic visualization and the soft tissues overlying this structure were infiltrated with 4 ml. of 1% Lidocaine without Epinephrine. A # 20 gauge, Tuohy needle was inserted into the epidural space using a paramedian approach.  The epidural space was localized using loss of resistance along with contralateral oblique bi-planar fluoroscopic views.  After negative aspirate for air, blood, and CSF, a 2 ml. volume of Isovue-250 was injected into the epidural space and the flow of contrast was observed. Radiographs were obtained for documentation purposes.   The injectate was administered into the level noted above.  Additional Comments:  The patient tolerated the procedure well Dressing: 2 x 2 sterile gauze and Band-Aid    Post-procedure details: Patient was observed during the procedure. Post-procedure instructions were reviewed.  Patient left the clinic in stable condition.   Clinical History: INDICATION: Pain in right shoulder.  Neck pain. Pain radiates down the RIGHT arm.   COMPARISON: Radiographs performed on 09/29/2020.   TECHNIQUE: Multiplanar, multisequence MR imaging obtained through the cervical spine without intravenous administration of contrast on 10/19/2020 12:51 PM.   CONTRAST:  None.   FINDINGS:  #  Osseous structures: Vertebral body heights are maintained. No acute fracture or concerning marrow signal abnormality.  #  Alignment: Alignment  maintained.  #  Cervicomedullary junction: Unremarkable. No cerebellar tonsillar ectopia.  #  Spinal cord: No intrinsic spinal cord abnormality.   #  C2-C3: No significant disc herniation, spinal canal, or neuroforaminal compromise.  #  C3-C4: Facet ankylosing on the RIGHT. Mild uncovertebral disease on the RIGHT. This is producing mild RIGHT neuroforaminal stenosis. The LEFT neuroforamen and spinal canal are patent.  #  C4-C5: Bilateral uncovertebral disease producing mild RIGHT neuroforaminal stenosis. LEFT neuroforamen and spinal canal are patent.  #  C5-C6: Loss of disc height with a posterior disc osteophytic complex and uncovertebral disease. This results in mild effacement of the thecal sac and moderate bilateral neuroforaminal stenosis.  #  C6-C7: Loss of disc height with a posterior disc osteophytic complex and uncovertebral disease. There is ligamentum flavum redundancy. This results in moderate spinal canal narrowing. Moderate LEFT and mild RIGHT neuroforaminal stenosis.  #  C7-T1: Posterior central disc protrusion producing effacement of the ventral thecal sac. Neuroforamen are patent.   #  Paraspinal tissues: Unremarkable   #  Additional comments: Multiple small nodules in the thyroid gland.    IMPRESSION:  1.  Cervical spondylosis and facet arthrosis as described above. This is most notable for moderate bilateral neuroforaminal stenosis and mild spinal canal narrowing at C5-C6; moderate spinal canal narrowing and moderate LEFT and mild RIGHT neuroforaminal stenosis at C6-C7.  2.  Multiple small nodules in the thyroid gland. Recommend correlation with prior imaging or ultrasound for further characterization.   Electronically Signed by: Ardyth Man on 10/19/2020 2:23 PM     Objective:  VS:  HT:    WT:   BMI:     BP:125/85  HR:86bpm  TEMP: ( )  RESP:  Physical Exam Vitals and nursing note reviewed.  Constitutional:      General: She is not in acute distress.    Appearance:  Normal appearance. She is not ill-appearing.  HENT:     Head: Normocephalic and atraumatic.     Right Ear: External ear normal.     Left Ear: External ear normal.  Eyes:     Extraocular Movements: Extraocular movements intact.  Cardiovascular:     Rate and Rhythm: Normal rate.     Pulses: Normal pulses.  Musculoskeletal:     Cervical back: Tenderness present. No rigidity.     Right lower leg: No edema.     Left lower leg: No edema.     Comments: Patient has good strength in the upper extremities including 5 out of 5 strength in wrist extension long finger flexion and APB.  There is no atrophy of the hands intrinsically.  There is a negative Hoffmann's test.   Lymphadenopathy:     Cervical: No cervical adenopathy.  Skin:    Findings: No erythema, lesion or rash.  Neurological:     General: No focal deficit present.     Mental Status: She is alert and oriented to person, place, and time.     Sensory: No sensory deficit.     Motor: No weakness or abnormal muscle tone.     Coordination: Coordination normal.  Psychiatric:        Mood and Affect: Mood normal.        Behavior: Behavior normal.     Imaging: No results found.

## 2020-11-27 NOTE — Procedures (Signed)
Cervical Epidural Steroid Injection - Interlaminar Approach with Fluoroscopic Guidance  Patient: Nicole Campos      Date of Birth: 1960-05-23 MRN: [id number] PCP: Shelva Majestic, MD      Visit Date: 11/09/2020   Universal Protocol:    Date/Time: 09/17/221:32 PM  Consent Given By: the patient  Position: PRONE  Additional Comments: Vital signs were monitored before and after the procedure. Patient was prepped and draped in the usual sterile fashion. The correct patient, procedure, and site was verified.   Injection Procedure Details:   Procedure diagnoses: Cervical radiculopathy [M54.12]    Meds Administered:  Meds ordered this encounter  Medications   betamethasone acetate-betamethasone sodium phosphate (CELESTONE) injection 12 mg     Laterality: Right  Location/Site: C7-T1  Needle: 3.5 in., 20 ga. Tuohy  Needle Placement: Paramedian epidural space  Findings:  -Comments: Excellent flow of contrast into the epidural space.  Procedure Details: Using a paramedian approach from the side mentioned above, the region overlying the inferior lamina was localized under fluoroscopic visualization and the soft tissues overlying this structure were infiltrated with 4 ml. of 1% Lidocaine without Epinephrine. A # 20 gauge, Tuohy needle was inserted into the epidural space using a paramedian approach.  The epidural space was localized using loss of resistance along with contralateral oblique bi-planar fluoroscopic views.  After negative aspirate for air, blood, and CSF, a 2 ml. volume of Isovue-250 was injected into the epidural space and the flow of contrast was observed. Radiographs were obtained for documentation purposes.   The injectate was administered into the level noted above.  Additional Comments:  The patient tolerated the procedure well Dressing: 2 x 2 sterile gauze and Band-Aid    Post-procedure details: Patient was observed during the  procedure. Post-procedure instructions were reviewed.  Patient left the clinic in stable condition.

## 2020-12-07 ENCOUNTER — Telehealth: Payer: Self-pay | Admitting: Physical Medicine and Rehabilitation

## 2020-12-07 DIAGNOSIS — M5412 Radiculopathy, cervical region: Secondary | ICD-10-CM

## 2020-12-07 DIAGNOSIS — F411 Generalized anxiety disorder: Secondary | ICD-10-CM

## 2020-12-07 NOTE — Telephone Encounter (Signed)
Pt wanting to get sch for second inj with Dr. Alvester Morin, previous appt was 11/09/20. The best call back number for the pt is [phone].

## 2020-12-08 NOTE — Telephone Encounter (Signed)
Pt called again

## 2020-12-08 NOTE — Telephone Encounter (Signed)
Right C7-T1 IL 11/09/20. Please advise.

## 2020-12-09 NOTE — Telephone Encounter (Signed)
Patient states that she did not notice much change at all in the level of pain following the injection.

## 2020-12-10 NOTE — Telephone Encounter (Signed)
Scheduled for 10/10. Patient would like valium prior. Pharmacy is correct.

## 2020-12-12 MED ORDER — DIAZEPAM 5 MG PO TABS: ORAL_TABLET | ORAL | 0 refills | Status: DC

## 2020-12-12 NOTE — Addendum Note (Signed)
Addended by: Ashok Norris on: 12/12/2020 04:56 PM   Modules accepted: Orders

## 2020-12-20 ENCOUNTER — Other Ambulatory Visit: Payer: Self-pay

## 2020-12-20 ENCOUNTER — Ambulatory Visit: Payer: Self-pay

## 2020-12-20 ENCOUNTER — Ambulatory Visit (INDEPENDENT_AMBULATORY_CARE_PROVIDER_SITE_OTHER): Payer: Commercial Managed Care - PPO | Admitting: Physical Medicine and Rehabilitation

## 2020-12-20 ENCOUNTER — Encounter: Payer: Self-pay | Admitting: Physical Medicine and Rehabilitation

## 2020-12-20 VITALS — BP 135/85 | HR 69

## 2020-12-20 DIAGNOSIS — M5412 Radiculopathy, cervical region: Secondary | ICD-10-CM | POA: Diagnosis not present

## 2020-12-20 MED ORDER — BETAMETHASONE SOD PHOS & ACET 6 (3-3) MG/ML IJ SUSP
12.0000 mg | Freq: Once | INTRAMUSCULAR | Status: AC
Start: 2020-12-20 — End: 2020-12-20
  Administered 2020-12-20: 12 mg

## 2020-12-20 NOTE — Patient Instructions (Signed)

## 2020-12-20 NOTE — Progress Notes (Signed)
Pt state neck pain that travels to her right shoulder and under arm. Pt state any movement with the arm makes the pain worse. Pt state she takes pain meds to help ease her pain. Pt has hx of inj on 11/09/20 pt state it helped for two days  Numeric Pain Rating Scale and Functional Assessment Average Pain 3   In the last MONTH (on 0-10 scale) has pain interfered with the following?  1. General activity like being  able to carry out your everyday physical activities such as walking, climbing stairs, carrying groceries, or moving a chair?  Rating(8)   +Driver, -BT, -Dye Allergies.

## 2020-12-20 NOTE — Procedures (Signed)
Cervical Epidural Steroid Injection - Interlaminar Approach with Fluoroscopic Guidance  Patient: Nicole Campos      Date of Birth: 04/25/1960 MRN: [id number] PCP: Shelva Majestic, MD      Visit Date: 12/20/2020   Universal Protocol:    Date/Time: 12/20/2208:04 AM  Consent Given By: the patient  Position: PRONE  Additional Comments: Vital signs were monitored before and after the procedure. Patient was prepped and draped in the usual sterile fashion. The correct patient, procedure, and site was verified.   Injection Procedure Details:   Procedure diagnoses: Cervical radiculopathy [M54.12]    Meds Administered:  Meds ordered this encounter  Medications   betamethasone acetate-betamethasone sodium phosphate (CELESTONE) injection 12 mg     Laterality: Right  Location/Site: C7-T1  Needle: 3.5 in., 20 ga. Tuohy  Needle Placement: Paramedian epidural space  Findings:  -Comments: Excellent flow of contrast into the epidural space.  Procedure Details: Using a paramedian approach from the side mentioned above, the region overlying the inferior lamina was localized under fluoroscopic visualization and the soft tissues overlying this structure were infiltrated with 4 ml. of 1% Lidocaine without Epinephrine. A # 20 gauge, Tuohy needle was inserted into the epidural space using a paramedian approach.  The epidural space was localized using loss of resistance along with contralateral oblique bi-planar fluoroscopic views.  After negative aspirate for air, blood, and CSF, a 2 ml. volume of Isovue-250 was injected into the epidural space and the flow of contrast was observed. Radiographs were obtained for documentation purposes.   The injectate was administered into the level noted above.  Additional Comments:  No complications occurred Dressing: 2 x 2 sterile gauze and Band-Aid    Post-procedure details: Patient was observed during the procedure. Post-procedure  instructions were reviewed.  Patient left the clinic in stable condition.

## 2020-12-20 NOTE — Progress Notes (Signed)
Nicole Campos - 60 y.o. female MRN [id number]  Date of birth: 06-29-1960  Office Visit Note: Visit Date: 12/20/2020 PCP: Shelva Majestic, MD Referred by: Shelva Majestic, MD  Subjective: Chief Complaint  Patient presents with   Neck - Pain   Right Shoulder - Pain   HPI:  Nicole Campos is a 60 y.o. female who comes in today for planned repeat Right C7-T1  Cervical Interlaminar epidural steroid injection with fluoroscopic guidance.  The patient has failed conservative care including home exercise, medications, time and activity modification.  This injection will be diagnostic and hopefully therapeutic.  Please see requesting physician notes for further details and justification. Patient received more than 50% pain relief from prior injection.   Referring: Gaspar Bidding, DO   ROS Otherwise per HPI.  Assessment & Plan: Visit Diagnoses:    ICD-10-CM   1. Cervical radiculopathy  M54.12 XR C-ARM NO REPORT    Epidural Steroid injection    betamethasone acetate-betamethasone sodium phosphate (CELESTONE) injection 12 mg      Plan: No additional findings.   Meds & Orders:  Meds ordered this encounter  Medications   betamethasone acetate-betamethasone sodium phosphate (CELESTONE) injection 12 mg    Orders Placed This Encounter  Procedures   XR C-ARM NO REPORT   Epidural Steroid injection    Follow-up: Return for Gaspar Bidding, DO, visit to requesting physician as needed.   Procedures: No procedures performed  Cervical Epidural Steroid Injection - Interlaminar Approach with Fluoroscopic Guidance  Patient: Nicole Campos      Date of Birth: Mar 21, 1960 MRN: [id number] PCP: Shelva Majestic, MD      Visit Date: 12/20/2020   Universal Protocol:    Date/Time: 12/20/2208:04 AM  Consent Given By: the patient  Position: PRONE  Additional Comments: Vital signs were monitored before and after the procedure. Patient was prepped and draped in the usual sterile  fashion. The correct patient, procedure, and site was verified.   Injection Procedure Details:   Procedure diagnoses: Cervical radiculopathy [M54.12]    Meds Administered:  Meds ordered this encounter  Medications   betamethasone acetate-betamethasone sodium phosphate (CELESTONE) injection 12 mg     Laterality: Right  Location/Site: C7-T1  Needle: 3.5 in., 20 ga. Tuohy  Needle Placement: Paramedian epidural space  Findings:  -Comments: Excellent flow of contrast into the epidural space.  Procedure Details: Using a paramedian approach from the side mentioned above, the region overlying the inferior lamina was localized under fluoroscopic visualization and the soft tissues overlying this structure were infiltrated with 4 ml. of 1% Lidocaine without Epinephrine. A # 20 gauge, Tuohy needle was inserted into the epidural space using a paramedian approach.  The epidural space was localized using loss of resistance along with contralateral oblique bi-planar fluoroscopic views.  After negative aspirate for air, blood, and CSF, a 2 ml. volume of Isovue-250 was injected into the epidural space and the flow of contrast was observed. Radiographs were obtained for documentation purposes.   The injectate was administered into the level noted above.  Additional Comments:  No complications occurred Dressing: 2 x 2 sterile gauze and Band-Aid    Post-procedure details: Patient was observed during the procedure. Post-procedure instructions were reviewed.  Patient left the clinic in stable condition.    Clinical History: INDICATION: Pain in right shoulder.  Neck pain. Pain radiates down the RIGHT arm.   COMPARISON: Radiographs performed on 09/29/2020.   TECHNIQUE: Multiplanar, multisequence MR imaging obtained through the cervical  spine without intravenous administration of contrast on 10/19/2020 12:51 PM.   CONTRAST: None.   FINDINGS:  #  Osseous structures: Vertebral body heights are  maintained. No acute fracture or concerning marrow signal abnormality.  #  Alignment: Alignment maintained.  #  Cervicomedullary junction: Unremarkable. No cerebellar tonsillar ectopia.  #  Spinal cord: No intrinsic spinal cord abnormality.   #  C2-C3: No significant disc herniation, spinal canal, or neuroforaminal compromise.  #  C3-C4: Facet ankylosing on the RIGHT. Mild uncovertebral disease on the RIGHT. This is producing mild RIGHT neuroforaminal stenosis. The LEFT neuroforamen and spinal canal are patent.  #  C4-C5: Bilateral uncovertebral disease producing mild RIGHT neuroforaminal stenosis. LEFT neuroforamen and spinal canal are patent.  #  C5-C6: Loss of disc height with a posterior disc osteophytic complex and uncovertebral disease. This results in mild effacement of the thecal sac and moderate bilateral neuroforaminal stenosis.  #  C6-C7: Loss of disc height with a posterior disc osteophytic complex and uncovertebral disease. There is ligamentum flavum redundancy. This results in moderate spinal canal narrowing. Moderate LEFT and mild RIGHT neuroforaminal stenosis.  #  C7-T1: Posterior central disc protrusion producing effacement of the ventral thecal sac. Neuroforamen are patent.   #  Paraspinal tissues: Unremarkable   #  Additional comments: Multiple small nodules in the thyroid gland.    IMPRESSION:  1.  Cervical spondylosis and facet arthrosis as described above. This is most notable for moderate bilateral neuroforaminal stenosis and mild spinal canal narrowing at C5-C6; moderate spinal canal narrowing and moderate LEFT and mild RIGHT neuroforaminal stenosis at C6-C7.  2.  Multiple small nodules in the thyroid gland. Recommend correlation with prior imaging or ultrasound for further characterization.   Electronically Signed by: Ardyth Man on 10/19/2020 2:23 PM     Objective:  VS:  HT:    WT:   BMI:     BP:135/85  HR:69bpm  TEMP: ( )  RESP:  Physical Exam Vitals and  nursing note reviewed.  Constitutional:      General: She is not in acute distress.    Appearance: Normal appearance. She is not ill-appearing.  HENT:     Head: Normocephalic and atraumatic.     Right Ear: External ear normal.     Left Ear: External ear normal.  Eyes:     Extraocular Movements: Extraocular movements intact.  Cardiovascular:     Rate and Rhythm: Normal rate.     Pulses: Normal pulses.  Musculoskeletal:     Cervical back: Tenderness present. No rigidity.     Right lower leg: No edema.     Left lower leg: No edema.     Comments: Patient has good strength in the upper extremities including 5 out of 5 strength in wrist extension long finger flexion and APB.  There is no atrophy of the hands intrinsically.  There is a negative Hoffmann's test.   Lymphadenopathy:     Cervical: No cervical adenopathy.  Skin:    Findings: No erythema, lesion or rash.  Neurological:     General: No focal deficit present.     Mental Status: She is alert and oriented to person, place, and time.     Sensory: No sensory deficit.     Motor: No weakness or abnormal muscle tone.     Coordination: Coordination normal.  Psychiatric:        Mood and Affect: Mood normal.        Behavior: Behavior normal.  Imaging: No results found.

## 2020-12-30 ENCOUNTER — Telehealth: Payer: Self-pay | Admitting: Physical Medicine and Rehabilitation

## 2020-12-30 NOTE — Telephone Encounter (Signed)
Pt states injection gave her a little relief but the pain didn't go away. Also the other side had started hurting for a week but went away.   [phone]

## 2021-01-05 ENCOUNTER — Telehealth: Payer: Commercial Managed Care - PPO | Admitting: Physician Assistant

## 2021-01-06 ENCOUNTER — Other Ambulatory Visit: Payer: Self-pay

## 2021-01-06 ENCOUNTER — Encounter: Payer: Self-pay | Admitting: Physical Medicine and Rehabilitation

## 2021-01-06 ENCOUNTER — Ambulatory Visit: Payer: Commercial Managed Care - PPO | Admitting: Physical Medicine and Rehabilitation

## 2021-01-06 VITALS — BP 130/82 | HR 77

## 2021-01-06 DIAGNOSIS — M4802 Spinal stenosis, cervical region: Secondary | ICD-10-CM

## 2021-01-06 DIAGNOSIS — M25511 Pain in right shoulder: Secondary | ICD-10-CM

## 2021-01-06 DIAGNOSIS — M7918 Myalgia, other site: Secondary | ICD-10-CM

## 2021-01-06 DIAGNOSIS — M5412 Radiculopathy, cervical region: Secondary | ICD-10-CM

## 2021-01-06 DIAGNOSIS — G8929 Other chronic pain: Secondary | ICD-10-CM

## 2021-01-06 NOTE — Progress Notes (Signed)
Pt state neck pain that travels to her right shoulder and under arm. Pt mention she starting to have pain in her left shoulder blade and elbow area. Pt state day after her last inj she had a bad headache. Pt state any movement with the arm makes the pain worse. Pt state she takes pain meds to help ease her pain. Pt has hx of inj on 12/20/20 pt state this inj helped better than the first inj.  Numeric Pain Rating Scale and Functional Assessment Average Pain 1   In the last MONTH (on 0-10 scale) has pain interfered with the following?  1. General activity like being  able to carry out your everyday physical activities such as walking, climbing stairs, carrying groceries, or moving a chair?  Rating(6)

## 2021-01-06 NOTE — Progress Notes (Signed)
Nicole Campos - 60 y.o. female MRN [id number]  Date of birth: 1960/06/10  Office Visit Note: Visit Date: 01/06/2021 PCP: Shelva Majestic, MD Referred by: Shelva Majestic, MD  Subjective: Chief Complaint  Patient presents with   Neck - Pain   Right Shoulder - Pain   Left Shoulder - Pain   Left Elbow - Pain   HPI: Nicole Campos is a 60 y.o. female who comes in today for evaluation of chronic right sided neck pain radiating to shoulder and upper arm. Patient had right C7-T1 interlaminar epidural steroid injection on 12/20/2020. Patient reports this injection gave her greater than 50% pain relief and continues to sustain, however shortly after injection she began having left upper back pain that radiates to her shoulder and upper arm. Patient reports she feels tight and sore, also reports tender area to left upper back. Patient reports pain is exacerbated by movement and activity, describes as tightness and aching sensation. Patient currently rates as 7 out of 10. Patient reports some relief of pain with home exercise program, rest and medications. Patient's recent cervical MRI exhibits multi-level facet arthropathy, mild spinal canal narrowing at C5-C6 and moderate spinal canal narrowing at C6-C7. Patient currently being treated by Dr. Gaspar Bidding at [facility]. Patient reports chronic issues with right shoulder pain and recently received right shoulder steroid injection which she reports did help significantly with pain. Patient states she did taper down on her Gabapentin several weeks ago and feels like her pain did increase. She does voice that she would like to increase her dose of Gabapentin at this time. Patient also reports she is starting physical therapy for chronic neck issues today at [facility]. Patient denies focal weakness, numbness and tingling. Patient denies recent trauma or falls.   Review of Systems  Musculoskeletal:  Positive for  myalgias and neck pain.  Neurological:  Negative for tingling, sensory change, focal weakness and weakness.  All other systems reviewed and are negative. Otherwise per HPI.  Assessment & Plan: Visit Diagnoses:    ICD-10-CM   1. Cervical radiculopathy  M54.12     2. Spinal stenosis of cervical region  M48.02     3. Myofascial pain syndrome  M79.18     4. Chronic right shoulder pain  M25.511    G89.29        Plan: Findings:  Chronic, worsening and severe left upper back pain radiating to shoulder and upper arm. Patient's right sided neck issues resolved after recent right C7-T1 epidural steroid injection, however new left sided symptoms started shortly after injection. Patient's clinical presentation and exam are consistent with myofascial pain. Patient continues to have pain despite good conservative therapies such as home exercise program, rest and use of medications. Patient did get good and sustained relief of right sided symptoms with previous cervical epidural injection and we feel the next step is to continue to monitor and re-assess if pain does return. Patient instructed to attend physical therapy and to see if the therapist can assess and help to alleviate left sided symptoms. Patient also instructed to increase Gabapentin to 2 tablets in the morning and 2 tablets at night. We did discuss other possible medications that we could try such as Cymbalta, however patient would like to hold on this for now. We also discussed possibility of surgical consultation in the future if there is worsening cervical spine stenosis. No red flag symptoms noted upon exam today.   Patient  instructed to continue treatment with Dr. Gaspar Bidding for chronic right shoulder issues.    Meds & Orders: No orders of the defined types were placed in this encounter.  No orders of the defined types were placed in this encounter.   Follow-up: Return if symptoms worsen or fail to improve.   Procedures: No  procedures performed      Clinical History: INDICATION: Pain in right shoulder.  Neck pain. Pain radiates down the RIGHT arm.   COMPARISON: Radiographs performed on 09/29/2020.   TECHNIQUE: Multiplanar, multisequence MR imaging obtained through the cervical spine without intravenous administration of contrast on 10/19/2020 12:51 PM.   CONTRAST: None.   FINDINGS:  #  Osseous structures: Vertebral body heights are maintained. No acute fracture or concerning marrow signal abnormality.  #  Alignment: Alignment maintained.  #  Cervicomedullary junction: Unremarkable. No cerebellar tonsillar ectopia.  #  Spinal cord: No intrinsic spinal cord abnormality.   #  C2-C3: No significant disc herniation, spinal canal, or neuroforaminal compromise.  #  C3-C4: Facet ankylosing on the RIGHT. Mild uncovertebral disease on the RIGHT. This is producing mild RIGHT neuroforaminal stenosis. The LEFT neuroforamen and spinal canal are patent.  #  C4-C5: Bilateral uncovertebral disease producing mild RIGHT neuroforaminal stenosis. LEFT neuroforamen and spinal canal are patent.  #  C5-C6: Loss of disc height with a posterior disc osteophytic complex and uncovertebral disease. This results in mild effacement of the thecal sac and moderate bilateral neuroforaminal stenosis.  #  C6-C7: Loss of disc height with a posterior disc osteophytic complex and uncovertebral disease. There is ligamentum flavum redundancy. This results in moderate spinal canal narrowing. Moderate LEFT and mild RIGHT neuroforaminal stenosis.  #  C7-T1: Posterior central disc protrusion producing effacement of the ventral thecal sac. Neuroforamen are patent.   #  Paraspinal tissues: Unremarkable   #  Additional comments: Multiple small nodules in the thyroid gland.    IMPRESSION:  1.  Cervical spondylosis and facet arthrosis as described above. This is most notable for moderate bilateral neuroforaminal stenosis and mild spinal canal narrowing at  C5-C6; moderate spinal canal narrowing and moderate LEFT and mild RIGHT neuroforaminal stenosis at C6-C7.  2.  Multiple small nodules in the thyroid gland. Recommend correlation with prior imaging or ultrasound for further characterization.   Electronically Signed by: Ardyth Man on 10/19/2020 2:23 PM   She reports that she has never smoked. She has never used smokeless tobacco. No results for input(s): HGBA1C, LABURIC in the last 8760 hours.  Objective:  VS:  HT:    WT:   BMI:     BP:130/82  HR:77bpm  TEMP: ( )  RESP:  Physical Exam Vitals and nursing note reviewed.  HENT:     Head: Normocephalic and atraumatic.     Right Ear: External ear normal.     Left Ear: External ear normal.     Nose: Nose normal.     Mouth/Throat:     Mouth: Mucous membranes are moist.  Eyes:     Extraocular Movements: Extraocular movements intact.  Cardiovascular:     Rate and Rhythm: Normal rate.     Pulses: Normal pulses.  Pulmonary:     Effort: Pulmonary effort is normal.  Abdominal:     General: Abdomen is flat. There is no distension.  Musculoskeletal:        General: Tenderness present.     Cervical back: Tenderness present.     Comments: No discomfort noted with flexion, extension and  side-to-side rotation. Good strength noted to bilateral upper extremities. Sensation intact bilaterally. Tenderness  Negative Hoffman's sign.    Full ROM noted to right shoulder.   Skin:    General: Skin is warm and dry.     Capillary Refill: Capillary refill takes less than 2 seconds.  Neurological:     General: No focal deficit present.     Mental Status: She is alert.  Psychiatric:        Mood and Affect: Mood normal.    Ortho Exam  Imaging: No results found.  Past Medical/Family/Surgical/Social History: Medications & Allergies reviewed per EMR, new medications updated. Patient Active Problem List   Diagnosis Date Noted   Neuropathy 09/25/2018   Hyperlipidemia 07/19/2017   Hyperglycemia  07/19/2017   Palpitations 05/28/2017   Vertigo 05/28/2017   Adhesive capsulitis of left shoulder 01/30/2017   Hypertension 05/02/2016   Diffuse pain 05/02/2016   Family history of colon cancer 05/02/2016   Arthritis    Microscopic hematuria 10/16/2011   Past Medical History:  Diagnosis Date   Arthritis    cervical, lumbar, knees, feet. mild hands   Headache(784.0)    Hx: of occasinal Migraine   Heart palpitations    Hx: of   Hypertension    PONV (postoperative nausea and vomiting)    Family History  Problem Relation Age of Onset   Uterine cancer Mother    Hypertension Mother    Pancreatic cancer Father    Cancer - Colon Brother    Stroke Maternal Grandmother         late 4s   Lung cancer Maternal Grandfather    Heart attack Paternal Grandmother        late 103s   Heart attack Paternal Grandfather        died 49   Past Surgical History:  Procedure Laterality Date   APPENDECTOMY     CESAREAN SECTION  1990   CHOLECYSTECTOMY N/A 02/28/2013   Procedure: LAPAROSCOPIC CHOLECYSTECTOMY;  Surgeon: Axel Filler, MD;  Location: [facility];  Service: General;  Laterality: N/A;   COLONOSCOPY W/ POLYPECTOMY     Hx: of   KNEE SURGERY     Left knee - meniscus tear   LAPAROSCOPIC APPENDECTOMY N/A 08/05/2012   Procedure: APPENDECTOMY LAPAROSCOPIC;  Surgeon: Clovis Pu. Cornett, MD;  Location: [facility];  Service: General;  Laterality: N/A;   wisdom teeth     Social History   Occupational History   Not on file  Tobacco Use   Smoking status: Never   Smokeless tobacco: Never  Substance and Sexual Activity   Alcohol use: Yes    Alcohol/week: 3.0 standard drinks    Types: 3 Standard drinks or equivalent per week    Comment: occasional   Drug use: No   Sexual activity: Not on file

## 2021-01-18 LAB — HM MAMMOGRAPHY

## 2021-02-11 ENCOUNTER — Other Ambulatory Visit: Payer: Self-pay | Admitting: Family Medicine

## 2021-02-11 DIAGNOSIS — I10 Essential (primary) hypertension: Secondary | ICD-10-CM

## 2021-04-14 NOTE — Therapy (Signed)
A user error has taken place: encounter opened in error, closed for administrative reasons.

## 2021-06-06 NOTE — Progress Notes (Signed)
? ?Phone [phone] ?In person visit ?  ?Subjective:  ? ?Nicole Campos is a 61 y.o. year old very pleasant female patient who presents for/with See problem oriented charting ?Chief Complaint  ?Patient presents with  ? Hypertension  ? Hyperlipidemia  ? Hyperglycemia  ? Irregular Heart Beat  ?  Pt states her orthopedic was unhappy with her HR of 120 when she went to see him last Weds. She denies CP and SOB.  ? ? ?This visit occurred during the SARS-CoV-2 public health emergency.  Safety protocols were in place, including screening questions prior to the visit, additional usage of staff PPE, and extensive cleaning of exam room while observing appropriate contact time as indicated for disinfecting solutions.  ? ?Past Medical History-  ?Patient Active Problem List  ? Diagnosis Date Noted  ? Hyperlipidemia 07/19/2017  ?  Priority: Medium   ? Hyperglycemia 07/19/2017  ?  Priority: Medium   ? Palpitations 05/28/2017  ?  Priority: Medium   ? Vertigo 05/28/2017  ?  Priority: Medium   ? Hypertension 05/02/2016  ?  Priority: Medium   ? Diffuse pain 05/02/2016  ?  Priority: Medium   ? Arthritis   ?  Priority: Medium   ? Neuropathy 09/25/2018  ?  Priority: Low  ? Adhesive capsulitis of left shoulder 01/30/2017  ?  Priority: Low  ? Family history of colon cancer 05/02/2016  ?  Priority: Low  ? Microscopic hematuria 10/16/2011  ?  Priority: Low  ? ? ?Medications- reviewed and updated ?Current Outpatient Medications  ?Medication Sig Dispense Refill  ? gabapentin (NEURONTIN) 300 MG capsule Take 300 mg by mouth 3 (three) times daily.    ? metoprolol succinate (TOPROL-XL) 50 MG 24 hr tablet TAKE 1 TABLET BY MOUTH EVERY DAY WITH OR IMMEDIATELY FOLLOWING A MEAL 91 tablet 1  ? PENNSAID 2 % SOLN SMARTSIG:2 Pump Topical Twice Daily    ? Vitamin D, Cholecalciferol, 25 MCG (1000 UT) CAPS Take 1 capsule by mouth daily in the afternoon.    ? diazepam (VALIUM) 5 MG tablet Take 1 tablet (5 mg total) by mouth every 12 (twelve) hours as  needed for anxiety (do not drive for 12 hours after taking). 11 tablet 0  ? ?No current facility-administered medications for this visit.  ? ?  ?Objective:  ?BP 112/60   Pulse 78   Temp 97.6 ?F (36.4 ?C)   Ht 5\' 11"  (1.803 m)   Wt 224 lb (101.6 kg)   SpO2 98%   BMI 31.24 kg/m?  ?Gen: NAD, resting comfortably ?CV: RRR no murmurs rubs or gallops ?Lungs: CTAB no crackles, wheeze, rhonchi ?Ext: no edema ?Skin: warm, dry ? ?EKG: sinus rhythm with rate 75, normal axis, normal intervals, no hypertrophy, no st or t wave changes- sr' wave in v1 and v2 but stable from EKG 08/07/12.  ? ?  ? ?Assessment and Plan  ? ?# Tachycardia/palpitations  ?S:seen by sports medicine for frozen shoulder last week (on Wednesday) and HR up to 120s- had taken metoprolol 50 mg XR that morning.  No chest pain or SOB. Was feeling somewhat run down that day. Also had palpitations ?-a lot of anxiety/stress with work- feels overall tremulous internally ?- in the past has used valium for similar feeling by Dr. Berline Choughigby- had never tried- took half tablet one time and resolved the tremulous feeling ?-had similar on Tuesday before her Wednesday visit- stress all day long and she did well with half tablet ?-day after seeing DR.  Berline Chough when woke up in AM was 114 HR on fitbit- has not noted elevations other than that time ? ?- on gabpentin 300 mg 3x a day with extra 300mg  at night so 600mg  total.  had been on Cymbalta in past- was told to stop last week- was advised to take some valium half tablet twice daily until weekend. Thursday night when taking more consistently was not helpfu- kept tremulous feeling- was having some palpitations as well. She did take a third half tablet on Thursday and finally settled. By Saturday she stopped taking medication ? ?- palpitations maybe 3x this year until the last 2 weeks. Has not recently noted high HR when palpitations occurred until the last month- she has had more frequent palpitations . Tuesday and has  been having a very stressful period ?- in past had mentioned high salt or alcohol has triggered symptoms- denies recent issues- had even cut down on caffeine a week prior ?A/P: Patient with history of tachycardia and palpitations and reports cardiology evaluation years and years ago-I do not see evidence of this in epic report but may have predated her been outside of system.  She also admits to high levels of stress and anxiety and has had some inner tremulousness as well as palpitations that at times are associated with tachycardia (including one that was picked up by Dr. Monday office). ?- We discussed anxiety could certainly be a trigger but wanted to rule out other causes.  Consider SSRI in the long-term ?- In the short-term patient requested refill of diazepam which she has used very sparingly with #15 given in 2021 by Dr. Janeece Riggers refilled this and encouraged sparing use and also not to drive for 12 hours after using ?- EKG largely reassuring ?- Get 2-week event monitor to further evaluate and rule out arrhythmias ?- Check CBC, CMP, TSH to rule out organic cause ?-For now continue metoprolol and try to avoid obvious triggers for palpitations ? ?#hypertension ?S: medication: metoprolol 50 mg XR ?BP Readings from Last 3 Encounters:  ?06/07/21 112/60  ?01/06/21 130/82  ?12/20/20 135/85  ?A/P:  Controlled. Continue current medications.   ? ?Recommended follow up: Return for as needed for new, worsening, persistent symptoms. ?Future Appointments  ?Date Time Provider Department Center  ?10/05/2021  8:00 AM Galina Haddox, 02/19/21, MD [facility]  ? ? ?Lab/Order associations: ?  ICD-10-CM   ?1. Tachycardia  R00.0 EKG 12-Lead  ?  CBC with Differential/Platelet  ?  Comprehensive metabolic panel  ?  TSH  ?  Cardiac event monitor  ?  ?2. Palpitations  R00.2 EKG 12-Lead  ?  ?3. Essential hypertension  I10 CBC with Differential/Platelet  ?  Comprehensive metabolic panel  ?  TSH  ?  ? ? ?Meds ordered this encounter  ?Medications   ? diazepam (VALIUM) 5 MG tablet  ?  Sig: Take 1 tablet (5 mg total) by mouth every 12 (twelve) hours as needed for anxiety (do not drive for 12 hours after taking).  ?  Dispense:  11 tablet  ?  Refill:  0  ? ? ?I,Jada Bradford,acting as a scribe for 10/07/2021, MD.,have documented all relevant documentation on the behalf of Aldine Contes, MD,as directed by  Tana Conch, MD while in the presence of Tana Conch, MD. ? ? I, Tana Conch, MD, have reviewed all documentation for this visit. The documentation on 06/07/21 for the exam, diagnosis, procedures, and orders are all accurate and complete.  ? ? Time Spent: ?44 minutes  of total time (3:50 PM-4:30 PM, 5:11 PM- 5:14 PM) was spent on the date of the encounter performing the following actions: chart review prior to seeing the patient, obtaining history, performing a medically necessary exam, counseling on the treatment plan as well as trying to address patient's anxiety about current symptoms as well as her concerns, placing orders, and documenting in our EHR.  Of note interpreted EKG prior to the 3:50 PM noted above approximately from 348  to 3:50 PM. ? ?Return precautions advised.  ?Tana Conch, MD ? ? ?

## 2021-06-07 ENCOUNTER — Ambulatory Visit: Payer: Commercial Managed Care - PPO | Admitting: Family Medicine

## 2021-06-07 ENCOUNTER — Encounter: Payer: Self-pay | Admitting: Family Medicine

## 2021-06-07 VITALS — BP 112/60 | HR 78 | Temp 97.6°F | Ht 71.0 in | Wt 224.0 lb

## 2021-06-07 DIAGNOSIS — I10 Essential (primary) hypertension: Secondary | ICD-10-CM

## 2021-06-07 DIAGNOSIS — R000 Tachycardia, unspecified: Secondary | ICD-10-CM

## 2021-06-07 DIAGNOSIS — R739 Hyperglycemia, unspecified: Secondary | ICD-10-CM

## 2021-06-07 DIAGNOSIS — E785 Hyperlipidemia, unspecified: Secondary | ICD-10-CM

## 2021-06-07 DIAGNOSIS — R002 Palpitations: Secondary | ICD-10-CM

## 2021-06-07 DIAGNOSIS — G629 Polyneuropathy, unspecified: Secondary | ICD-10-CM

## 2021-06-07 MED ORDER — DIAZEPAM 5 MG PO TABS: 5.0000 mg | ORAL_TABLET | Freq: Two times a day (BID) | ORAL | 0 refills | Status: AC | PRN

## 2021-06-07 NOTE — Patient Instructions (Addendum)
Please stop by lab before you go ?If you have mychart- we will send your results within 3 business days of [place] receiving them.  ?If you do not have mychart- we will call you about results within 5 business days of [place] receiving them.  ?*please also note that you will see labs on mychart as soon as they post. I will later go in and write notes on them- will say "notes from Dr. Durene Cal"  ? ?We will call you within two weeks about your referral for cardiac monitor. If you do not hear within 2 weeks, give [place] a call.  ? ?Recommended follow up: Return for as needed for new, worsening, persistent symptoms. ?- schedule a visit after monitor results come back ? ?This could be anxiety related- did refill valium- depending on how things go may need to consider more long term anxiety medicine but lets see how work up goes and how you do as things settle back down at work ?

## 2021-06-08 ENCOUNTER — Other Ambulatory Visit: Payer: Self-pay | Admitting: Family Medicine

## 2021-06-08 DIAGNOSIS — R000 Tachycardia, unspecified: Secondary | ICD-10-CM

## 2021-06-08 DIAGNOSIS — R002 Palpitations: Secondary | ICD-10-CM

## 2021-06-08 LAB — CBC WITH DIFFERENTIAL/PLATELET
Basophils Absolute: 0.1 10*3/uL (ref 0.0–0.1)
Basophils Relative: 0.6 % (ref 0.0–3.0)
Eosinophils Absolute: 0.2 10*3/uL (ref 0.0–0.7)
Eosinophils Relative: 1.8 % (ref 0.0–5.0)
HCT: 43.4 % (ref 36.0–46.0)
Hemoglobin: 14.6 g/dL (ref 12.0–15.0)
Lymphocytes Relative: 19.1 % (ref 12.0–46.0)
Lymphs Abs: 1.6 10*3/uL (ref 0.7–4.0)
MCHC: 33.8 g/dL (ref 30.0–36.0)
MCV: 95.1 fl (ref 78.0–100.0)
Monocytes Absolute: 0.6 10*3/uL (ref 0.1–1.0)
Monocytes Relative: 7.4 % (ref 3.0–12.0)
Neutro Abs: 6 10*3/uL (ref 1.4–7.7)
Neutrophils Relative %: 71.1 % (ref 43.0–77.0)
Platelets: 329 10*3/uL (ref 150.0–400.0)
RBC: 4.56 Mil/uL (ref 3.87–5.11)
RDW: 13 % (ref 11.5–15.5)
WBC: 8.5 10*3/uL (ref 4.0–10.5)

## 2021-06-08 LAB — COMPREHENSIVE METABOLIC PANEL
ALT: 22 U/L (ref 0–35)
AST: 19 U/L (ref 0–37)
Albumin: 4.8 g/dL (ref 3.5–5.2)
Alkaline Phosphatase: 76 U/L (ref 39–117)
BUN: 18 mg/dL (ref 6–23)
CO2: 27 mEq/L (ref 19–32)
Calcium: 10.4 mg/dL (ref 8.4–10.5)
Chloride: 103 mEq/L (ref 96–112)
Creatinine, Ser: 0.85 mg/dL (ref 0.40–1.20)
GFR: 73.06 mL/min (ref >60.00)
Glucose, Bld: 89 mg/dL (ref 70–99)
Potassium: 4.2 mEq/L (ref 3.5–5.1)
Sodium: 139 mEq/L (ref 135–145)
Total Bilirubin: 0.4 mg/dL (ref 0.2–1.2)
Total Protein: 7.4 g/dL (ref 6.0–8.3)

## 2021-06-08 LAB — TSH: TSH: 0.02 u[IU]/mL — ABNORMAL LOW (ref 0.35–5.50)

## 2021-06-09 ENCOUNTER — Other Ambulatory Visit: Payer: Self-pay

## 2021-06-09 DIAGNOSIS — R7989 Other specified abnormal findings of blood chemistry: Secondary | ICD-10-CM

## 2021-06-10 ENCOUNTER — Other Ambulatory Visit: Payer: Self-pay | Admitting: Family Medicine

## 2021-06-10 ENCOUNTER — Other Ambulatory Visit (INDEPENDENT_AMBULATORY_CARE_PROVIDER_SITE_OTHER): Payer: Commercial Managed Care - PPO

## 2021-06-10 DIAGNOSIS — R7989 Other specified abnormal findings of blood chemistry: Secondary | ICD-10-CM | POA: Diagnosis not present

## 2021-06-10 LAB — T4, FREE: Free T4: 0.95 ng/dL (ref 0.60–1.60)

## 2021-06-10 LAB — TSH: TSH: 0.02 u[IU]/mL — ABNORMAL LOW (ref 0.35–5.50)

## 2021-06-10 LAB — T3, FREE: T3, Free: 3.3 pg/mL (ref 2.3–4.2)

## 2021-06-13 ENCOUNTER — Other Ambulatory Visit: Payer: Commercial Managed Care - PPO

## 2021-06-14 ENCOUNTER — Other Ambulatory Visit: Payer: Commercial Managed Care - PPO

## 2021-06-14 DIAGNOSIS — R7989 Other specified abnormal findings of blood chemistry: Secondary | ICD-10-CM

## 2021-06-16 ENCOUNTER — Telehealth: Payer: Self-pay

## 2021-06-16 LAB — TRAB (TSH RECEPTOR BINDING ANTIBODY): TRAB: 2.57 IU/L — ABNORMAL HIGH (ref <=2.00)

## 2021-06-16 NOTE — Telephone Encounter (Signed)
Spoke with [place] and she stated this particular test can take 4-5 days to come back. I called and left this information on pt vm also making pt aware that she will see the result on mychart prior to Dr. Durene Cal commenting and he is out of the office next week which may cause additional delay.  ?

## 2021-06-16 NOTE — Telephone Encounter (Signed)
Patient is requesting a call back.  Would like to know how much longer it will take to get lab back that was completed on 4/4.

## 2021-06-16 NOTE — Telephone Encounter (Signed)
I am unsure of how long it will take, I have asked Melissa to check into this for me and I will f/u with pt. ?

## 2021-06-18 ENCOUNTER — Ambulatory Visit (INDEPENDENT_AMBULATORY_CARE_PROVIDER_SITE_OTHER): Payer: Commercial Managed Care - PPO

## 2021-06-18 DIAGNOSIS — R002 Palpitations: Secondary | ICD-10-CM | POA: Diagnosis not present

## 2021-06-18 DIAGNOSIS — R000 Tachycardia, unspecified: Secondary | ICD-10-CM

## 2021-06-20 ENCOUNTER — Other Ambulatory Visit: Payer: Self-pay

## 2021-06-20 DIAGNOSIS — E0500 Thyrotoxicosis with diffuse goiter without thyrotoxic crisis or storm: Secondary | ICD-10-CM

## 2021-06-30 NOTE — Progress Notes (Signed)
-3 ? ? ?Name: Nicole Campos  ?MRN/ DOB: [id number], 05-Feb-1961    ?Age/ Sex: 61 y.o., female   ? ?PCP: Shelva MajesticHunter, Stephen O, MD   ?Reason for Endocrinology Evaluation: Hyperthyroid  ?   ?Date of Initial Endocrinology Evaluation: 07/01/2021   ? ? ?HPI: ?[name] is a 61 y.o. female with a past medical history of HTN, neuropathy. The patient presented for initial endocrinology clinic visit on 07/01/2021 for consultative assistance with her hyperthyroid.  ? ?Patient has been diagnosed with hyperthyroidism in March 2023 when she presented to her PCP with tachycardia and palpitations. ? ?She was also noted to have elevated TRAb at 2.45 IU/L ? ?No recent viral infection  ?Denies local neck swelling  ?She denies weight loss  ?Has a frozen shoulder which resulted in increase stress eating  ?Has had diarrhea the past couple of week , usually she has had constipation  ?Denies tremors  ?Has anxiety  ?Denies Biotin  ? ? ? ?No Fh of thyroid disease  ? ?HISTORY:  ?Past Medical History:  ?Past Medical History:  ?Diagnosis Date  ? Arthritis   ? cervical, lumbar, knees, feet. mild hands  ? Headache(784.0)   ? Hx: of occasinal Migraine  ? Heart palpitations   ? Hx: of  ? Hypertension   ? PONV (postoperative nausea and vomiting)   ? ?Past Surgical History:  ?Past Surgical History:  ?Procedure Laterality Date  ? APPENDECTOMY    ? CESAREAN SECTION  1990  ? CHOLECYSTECTOMY N/A 02/28/2013  ? Procedure: LAPAROSCOPIC CHOLECYSTECTOMY;  Surgeon: Axel FillerArmando Ramirez, MD;  Location: [facility];  Service: General;  Laterality: N/A;  ? COLONOSCOPY W/ POLYPECTOMY    ? Hx: of  ? KNEE SURGERY    ? Left knee - meniscus tear  ? LAPAROSCOPIC APPENDECTOMY N/A 08/05/2012  ? Procedure: APPENDECTOMY LAPAROSCOPIC;  Surgeon: Clovis Puhomas A. Cornett, MD;  Location: [facility];  Service: General;  Laterality: N/A;  ? wisdom teeth    ?  ?Social History:  reports that she has never smoked. She has never used smokeless tobacco. She reports current alcohol use of about  3.0 standard drinks per week. She reports that she does not use drugs. ?Family History: family history includes Cancer - Colon in her brother; Heart attack in her paternal grandfather and paternal grandmother; Hypertension in her mother; Lung cancer in her maternal grandfather; Pancreatic cancer in her father; Stroke in her maternal grandmother; Uterine cancer in her mother. ? ? ?HOME MEDICATIONS: ?Allergies as of 07/01/2021   ? ?   Reactions  ? Penicillins Itching  ? Pt states this may be an error and that she "was given a derivative" of PCN with no reactions at all.  ? ?  ? ?  ?Medication List  ?  ? ?  ? Accurate as of July 01, 2021 10:05 AM. If you have any questions, ask your nurse or doctor.  ?  ?  ? ?  ? ?diazepam 5 MG tablet ?Commonly known as: VALIUM ?Take 1 tablet (5 mg total) by mouth every 12 (twelve) hours as needed for anxiety (do not drive for 12 hours after taking). ?  ?DULoxetine 20 MG capsule ?Commonly known as: CYMBALTA ?Take 20 mg by mouth daily. ?  ?gabapentin 300 MG capsule ?Commonly known as: NEURONTIN ?Take 300 mg by mouth 3 (three) times daily. ?  ?methimazole 5 MG tablet ?Commonly known as: TAPAZOLE ?Take 1 tablet (5 mg total) by mouth daily. ?Started by: Scarlette ShortsIbtehal J Celsa Nordahl, MD ?  ?  metoprolol succinate 50 MG 24 hr tablet ?Commonly known as: TOPROL-XL ?TAKE 1 TABLET BY MOUTH EVERY DAY WITH OR IMMEDIATELY FOLLOWING A MEAL ?  ?Pennsaid 2 % Soln ?Generic drug: Diclofenac Sodium ?SMARTSIG:2 Pump Topical Twice Daily ?  ?tiZANidine 4 MG tablet ?Commonly known as: ZANAFLEX ?Take 4 mg by mouth 2 (two) times daily. ?  ?Vitamin D (Cholecalciferol) 25 MCG (1000 UT) Caps ?Take 1 capsule by mouth daily in the afternoon. ?  ? ?  ?  ? ? ?REVIEW OF SYSTEMS: ?A comprehensive ROS was conducted with the patient and is negative except as per HPI  ? ? ?OBJECTIVE:  ?VS: BP 112/70 (BP Location: Left Arm, Patient Position: Sitting, Cuff Size: Large)   Pulse 71   Ht 5\' 11"  (1.803 m)   Wt 229 lb (103.9 kg)    SpO2 98%   BMI 31.94 kg/m?   ? ?Wt Readings from Last 3 Encounters:  ?07/01/21 229 lb (103.9 kg)  ?06/07/21 224 lb (101.6 kg)  ?10/04/20 219 lb 6.1 oz (99.5 kg)  ? ? ? ?EXAM: ?General: Pt appears well and is in NAD  ?Eyes: External eye exam normal without stare, lid lag or exophthalmos.  EOM intact.  PERRL.  ?Neck: General: Supple without adenopathy. ?Thyroid: Thyroid size normal.  No goiter or nodules appreciated. No thyroid bruit.  ?Lungs: Clear with good BS bilat with no rales, rhonchi, or wheezes  ?Heart: Auscultation: RRR.  ?Abdomen: Normoactive bowel sounds, soft, nontender, without masses or organomegaly palpable  ?Extremities:  ?BL LE: No pretibial edema normal ROM and strength.  ?Mental Status: Judgment, insight: Intact ?Orientation: Oriented to time, place, and person ?Memory: Intact for recent and remote events ?Mood and affect: No depression, anxiety, or agitation  ? ? ? ?DATA REVIEWED: ? Latest Reference Range & Units 06/10/21 08:57  ?TSH 0.35 - 5.50 uIU/mL 0.02 (L)  ?Triiodothyronine,Free,Serum 2.3 - 4.2 pg/mL 3.3  ?T4,Free(Direct) 0.60 - 1.60 ng/dL 06/12/21  ?(L): Data is abnormally low ? ? Latest Reference Range & Units 06/07/21 16:24  ?Sodium 135 - 145 mEq/L 139  ?Potassium 3.5 - 5.1 mEq/L 4.2  ?Chloride 96 - 112 mEq/L 103  ?CO2 19 - 32 mEq/L 27  ?Glucose 70 - 99 mg/dL 89  ?BUN 6 - 23 mg/dL 18  ?Creatinine 0.40 - 1.20 mg/dL 06/09/21  ?Calcium 8.4 - 10.5 mg/dL 6.64  ?Alkaline Phosphatase 39 - 117 U/L 76  ?Albumin 3.5 - 5.2 g/dL 4.8  ?AST 0 - 37 U/L 19  ?ALT 0 - 35 U/L 22  ?Total Protein 6.0 - 8.3 g/dL 7.4  ?Total Bilirubin 0.2 - 1.2 mg/dL 0.4  ?GFR >60.00 mL/min 73.06  ? ? Latest Reference Range & Units 06/07/21 16:24  ?WBC 4.0 - 10.5 K/uL 8.5  ?RBC 3.87 - 5.11 Mil/uL 4.56  ?Hemoglobin 12.0 - 15.0 g/dL 06/09/21  ?HCT 36.0 - 46.0 % 43.4  ?MCV 78.0 - 100.0 fl 95.1  ?MCHC 30.0 - 36.0 g/dL 47.4  ?RDW 11.5 - 15.5 % 13.0  ?Platelets 150.0 - 400.0 K/uL 329.0  ?Neutrophils 43.0 - 77.0 % 71.1  ?Lymphocytes 12.0 - 46.0 %  19.1  ?Monocytes Relative 3.0 - 12.0 % 7.4  ?Eosinophil 0.0 - 5.0 % 1.8  ?Basophil 0.0 - 3.0 % 0.6  ?NEUT# 1.4 - 7.7 K/uL 6.0  ?Lymphocyte # 0.7 - 4.0 K/uL 1.6  ?Monocyte # 0.1 - 1.0 K/uL 0.6  ?Eosinophils Absolute 0.0 - 0.7 K/uL 0.2  ?Basophils Absolute 0.0 - 0.1 K/uL 0.1  ? ?  ?  Latest Reference Range & Units 06/14/21 09:12  ?TRAB <=2.00 IU/L 2.57 (H)  ? ? ?ASSESSMENT/PLAN/RECOMMENDATIONS:  ? ?Hyperthyroidism: ? ?-Patient with hyperthyroid symptoms ?-No local neck symptoms ?-We discussed differential diagnosis of Graves' disease but also autonomous thyroid nodule, we will proceed with ultrasound of the thyroid ?-We discussed that Graves' Disease is a result of an autoimmune condition involving the thyroid.  ? ? ?We discussed with pt the benefits of methimazole in the Tx of hyperthyroidism, as well as the possible side effects/complications of anti-thyroid drug Tx (specifically detailing the rare, but serious side effect of agranulocytosis). She was informed of need for regular thyroid function monitoring while on methimazole to ensure appropriate dosage without over-treatment. As well, we discussed the possible side effects of methimazole including the chance of rash, the small chance of liver irritation/juandice and the <=1 in 300-400 chance of sudden onset agranulocytosis.  We discussed importance of going to ED promptly (and stopping methimazole) if shewere to develop significant fever with severe sore throat of other evidence of acute infection.    ? ? ?Medications : ?Start methimazole 5 mg daily ? ?2.  Graves' disease: ? ?-Slight elevation in TRAb ?-No extrathyroidal manifestation of Graves' disease ?-Patient advised to let her ophthalmologist know about this diagnosis ? ? ?Follow-up in 3 months ?Labs in 3 weeks ? ?Signed electronically by: ?Abby Raelyn Mora, MD ? ?[facility]  ?[facility] ?[address] ?[place], [place] [address] ?Phone: [phone] ?FAX:  [phone] ? ? ?CC: ?Shelva Majestic, MD ?[address] ?[place] [address] ?Phone: [phone] ?Fax: [phone] ? ? ?Return to Endocrinology clinic as below: ?Future Appointments  ?Date Time Provider Dep

## 2021-07-01 ENCOUNTER — Ambulatory Visit
Admission: RE | Admit: 2021-07-01 | Discharge: 2021-07-01 | Disposition: A | Discharge status: Home / Self Care | Payer: Commercial Managed Care - PPO | Source: Ambulatory Visit | Attending: Internal Medicine | Admitting: Internal Medicine

## 2021-07-01 ENCOUNTER — Encounter: Payer: Self-pay | Admitting: Internal Medicine

## 2021-07-01 ENCOUNTER — Ambulatory Visit: Payer: Commercial Managed Care - PPO | Admitting: Internal Medicine

## 2021-07-01 VITALS — BP 112/70 | HR 71 | Ht 71.0 in | Wt 229.0 lb

## 2021-07-01 DIAGNOSIS — E0590 Thyrotoxicosis, unspecified without thyrotoxic crisis or storm: Secondary | ICD-10-CM

## 2021-07-01 DIAGNOSIS — E0500 Thyrotoxicosis with diffuse goiter without thyrotoxic crisis or storm: Secondary | ICD-10-CM

## 2021-07-01 MED ORDER — METHIMAZOLE 5 MG PO TABS: 5.0000 mg | ORAL_TABLET | Freq: Every day | ORAL | 2 refills | Status: DC

## 2021-07-01 NOTE — Patient Instructions (Signed)
We recommend that you follow these hyperthyroidism instructions at home: ? ?1) Take Methimazole 5 mg one tablet  a day ?If you develop severe sore throat with high fevers OR develop unexplained yellowing of your skin, eyes, under your tongue, severe abdominal pain with nausea or vomiting --> then please get evaluated immediately. ? ?2) Get repeat thyroid labs 3 weeks .  ?It is ESSENTIAL to get follow-up labs to help avoid over or undertreatment of your hyperthyroidism - both of which can be dangerous to your health. ? ? ?

## 2021-07-12 ENCOUNTER — Telehealth: Payer: Self-pay | Admitting: Family Medicine

## 2021-07-12 NOTE — Telephone Encounter (Signed)
Pt is requesting to be worked into provider schedule, before next appointment date. ? ?Pt is scheduled for OV Monday 05/15 ? ?Pt's appointment has been added to the waitlist. ? ?Approve? ?If so, when would be ideal for provider? ?

## 2021-07-12 NOTE — Telephone Encounter (Signed)
What is pt needing to be worked in for? ?

## 2021-07-15 NOTE — Telephone Encounter (Signed)
My understanding is this if for the "cardiac/heart" concerns on the 05/15 appointment. ?

## 2021-07-15 NOTE — Telephone Encounter (Signed)
Cancellation list is reasonable- happy to see her sooner if we have an opening come up  ?

## 2021-07-15 NOTE — Telephone Encounter (Signed)
Ok to address this on 05/15 appt or does pt need to be worked in sooner? ?

## 2021-07-15 NOTE — Telephone Encounter (Signed)
See below

## 2021-07-21 ENCOUNTER — Ambulatory Visit: Payer: Commercial Managed Care - PPO | Admitting: Family Medicine

## 2021-07-21 ENCOUNTER — Encounter: Payer: Self-pay | Admitting: Family Medicine

## 2021-07-21 VITALS — BP 120/72 | HR 61 | Temp 98.2°F | Ht 71.0 in | Wt 229.8 lb

## 2021-07-21 DIAGNOSIS — I491 Atrial premature depolarization: Secondary | ICD-10-CM

## 2021-07-21 DIAGNOSIS — E0500 Thyrotoxicosis with diffuse goiter without thyrotoxic crisis or storm: Secondary | ICD-10-CM | POA: Diagnosis not present

## 2021-07-21 DIAGNOSIS — I10 Essential (primary) hypertension: Secondary | ICD-10-CM

## 2021-07-21 DIAGNOSIS — F439 Reaction to severe stress, unspecified: Secondary | ICD-10-CM

## 2021-07-21 DIAGNOSIS — I493 Ventricular premature depolarization: Secondary | ICD-10-CM | POA: Diagnosis not present

## 2021-07-21 MED ORDER — METOPROLOL SUCCINATE ER 50 MG PO TB24: ORAL_TABLET | ORAL | 3 refills | Status: DC

## 2021-07-21 NOTE — Progress Notes (Signed)
?Phone [phone] ?In person visit ?  ?Subjective:  ? ?Nicole Campos is a 61 y.o. year old very pleasant female patient who presents for/with See problem oriented charting ?Chief Complaint  ?Patient presents with  ? Follow-up  ?  Pt is here to discuss heart monitor and stress.  ? ? ?Past Medical History-  ?Patient Active Problem List  ? Diagnosis Date Noted  ? Graves disease 07/21/2021  ?  Priority: High  ? Hyperlipidemia 07/19/2017  ?  Priority: Medium   ? Hyperglycemia 07/19/2017  ?  Priority: Medium   ? Palpitations 05/28/2017  ?  Priority: Medium   ? Vertigo 05/28/2017  ?  Priority: Medium   ? Hypertension 05/02/2016  ?  Priority: Medium   ? Diffuse pain 05/02/2016  ?  Priority: Medium   ? Arthritis   ?  Priority: Medium   ? Neuropathy 09/25/2018  ?  Priority: Low  ? Adhesive capsulitis of left shoulder 01/30/2017  ?  Priority: Low  ? Family history of colon cancer 05/02/2016  ?  Priority: Low  ? Microscopic hematuria 10/16/2011  ?  Priority: Low  ? ? ?Medications- reviewed and updated ?Current Outpatient Medications  ?Medication Sig Dispense Refill  ? diazepam (VALIUM) 5 MG tablet Take 1 tablet (5 mg total) by mouth every 12 (twelve) hours as needed for anxiety (do not drive for 12 hours after taking). 11 tablet 0  ? gabapentin (NEURONTIN) 300 MG capsule Take 300 mg by mouth 3 (three) times daily.    ? methimazole (TAPAZOLE) 5 MG tablet Take 1 tablet (5 mg total) by mouth daily. 90 tablet 2  ? PENNSAID 2 % SOLN SMARTSIG:2 Pump Topical Twice Daily    ? tiZANidine (ZANAFLEX) 4 MG tablet Take 4 mg by mouth 2 (two) times daily.    ? Vitamin D, Cholecalciferol, 25 MCG (1000 UT) CAPS Take 1 capsule by mouth daily in the afternoon.    ? DULoxetine (CYMBALTA) 20 MG capsule Take 20 mg by mouth daily. (Patient not taking: Reported on 07/01/2021)    ? metoprolol succinate (TOPROL-XL) 50 MG 24 hr tablet Take with or immediately following a meal. 91 tablet 3  ? ?No current facility-administered medications for this  visit.  ? ?  ?Objective:  ?BP 120/72   Pulse 61   Temp 98.2 ?F (36.8 ?C)   Ht 5\' 11"  (1.803 m)   Wt 229 lb 12.8 oz (104.2 kg)   SpO2 98%   BMI 32.05 kg/m?  ?Gen: NAD, resting comfortably ?CV: RRR no murmurs rubs or gallops ?Lungs: CTAB no crackles, wheeze, rhonchi ?Ext: no edema ?Skin: warm, dry ?  ? ?Assessment and Plan  ? ?#Palpitations/PVCs/PACs ?#Graves' disease ?#Stress ?S: Patient wore a cardiac monitor and primarily showed PVCs and PACs.  States first week didn't push button at all and was just getting used to it- 2nd week hit button more often.  There was a short period of PAT as well.  Known hyperthyroidism likely contributory and being managed now with methimazole.  She remains on her baseline metoprolol 50 mg extended release.  Potentially improving some ? ?Still a lot of work stress. Loves her job but high volume and no outlet.  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 Heated weighted pad uses in lap. Doesn't think she could do therapy.  ?A/P:  We had a healthy discussion of palpitations/PVCs/PACs-she will continue metoprolol and we discussed could potentially improve as thyroid continues to be treated ? ?For Graves' disease-continue methimazole and follow-up with endocrinology-has upcoming repeat labs ? ?For stress she is not necessarily interested in increasing any of her medications-on Cymbalta for pain modulation in the past with Dr. Berline Chough stopped and gabapentin for neuropathy.  She does not feel she could trust the therapist.  We discussed some coping mechanisms at home ?-wants time alone in general to recharge ?-running helps but worired about getting hurt ?-With multiple medical battles certainly contributing with shoulder issues/frozen shoulder as well as thyroid issue and palpitation issues-we  are hoping when medical problems improve that would help overall stress levels ? ?#hypertension ?S: medication: metoprolol 50 mg XR ?BP Readings from Last 3 Encounters:  ?07/21/21 120/72  ?07/01/21 112/70  ?06/07/21 112/60  ? A/P: Well-controlled-continue current medication ? ?Recommended follow up: Return for next already scheduled visit or sooner if needed. ?Future Appointments  ?Date Time Provider Department Center  ?07/27/2021  8:15 AM [facility] None  ?10/05/2021  8:00 AM Shelva Majestic, MD [facility]  ?11/09/2021  7:30 AM Shamleffer, Konrad Dolores, MD [facility] None  ? ? ?Lab/Order associations: ?  ICD-10-CM   ?1. Essential hypertension  I10 metoprolol succinate (TOPROL-XL) 50 MG 24 hr tablet  ?  ?2. Graves disease  E05.00   ?  ?3. Stress  F43.9   ?  ?4. PVC's (premature ventricular contractions)  I49.3   ?  ?5. PAC (premature atrial contraction)  I49.1   ?  ? ? ?Meds ordered this encounter  ?Medications  ? metoprolol succinate (TOPROL-XL) 50 MG 24 hr tablet  ?  Sig: Take with or immediately following a meal.  ?  Dispense:  91 tablet  ?  Refill:  3  ? ? ?Return precautions advised.  ?Tana Conch, MD ? ?

## 2021-07-21 NOTE — Patient Instructions (Addendum)
Appreciate your time today. No further changes at this time.  ? ?Recommended follow up: Return for next already scheduled visit or sooner if needed. ?

## 2021-07-25 ENCOUNTER — Ambulatory Visit: Payer: Commercial Managed Care - PPO | Admitting: Family Medicine

## 2021-07-27 ENCOUNTER — Other Ambulatory Visit (INDEPENDENT_AMBULATORY_CARE_PROVIDER_SITE_OTHER): Payer: Commercial Managed Care - PPO

## 2021-07-27 DIAGNOSIS — E0590 Thyrotoxicosis, unspecified without thyrotoxic crisis or storm: Secondary | ICD-10-CM | POA: Diagnosis not present

## 2021-07-27 LAB — TSH: TSH: 0.44 u[IU]/mL (ref 0.35–5.50)

## 2021-07-27 LAB — T4, FREE: Free T4: 0.73 ng/dL (ref 0.60–1.60)

## 2021-07-28 LAB — T3: T3, Total: 116 ng/dL (ref 76–181)

## 2021-08-04 ENCOUNTER — Other Ambulatory Visit: Payer: Self-pay | Admitting: Family Medicine

## 2021-08-04 ENCOUNTER — Encounter: Payer: Self-pay | Admitting: Internal Medicine

## 2021-08-04 DIAGNOSIS — I10 Essential (primary) hypertension: Secondary | ICD-10-CM

## 2021-09-21 ENCOUNTER — Ambulatory Visit (INDEPENDENT_AMBULATORY_CARE_PROVIDER_SITE_OTHER): Payer: Commercial Managed Care - PPO | Admitting: Physical Medicine and Rehabilitation

## 2021-09-21 ENCOUNTER — Encounter: Payer: Self-pay | Admitting: Physical Medicine and Rehabilitation

## 2021-09-21 DIAGNOSIS — R202 Paresthesia of skin: Secondary | ICD-10-CM

## 2021-09-21 NOTE — Progress Notes (Signed)
Pt state right and left shoulder pain that travels down her right arm and hand. Pt state the pain is always there, anything can makes the pain worse. Pt state she takes pain meds to help ease her pain.Pt state she right handed. Pt would like for notes to be sent to her PCP.  Numeric Pain Rating Scale and Functional Assessment Average Pain 4   In the last MONTH (on 0-10 scale) has pain interfered with the following?  1. General activity like being  able to carry out your everyday physical activities such as walking, climbing stairs, carrying groceries, or moving a chair?  Rating(9)   -BT, -Dye Allergies.

## 2021-09-21 NOTE — Progress Notes (Signed)
Nicole Campos - 61 y.o. female MRN [id number]  Date of birth: Aug 04, 1960  Office Visit Note: Visit Date: 09/21/2021 PCP: Shelva Majestic, MD Referred by: Andrena Mews, DO  Subjective: Chief Complaint  Patient presents with   Right Shoulder - Numbness, Pain   Left Shoulder - Numbness, Pain   Right Arm - Numbness, Pain   Right Hand - Numbness, Pain   HPI:  Nicole Campos is a 61 y.o. female who comes in today at the request of Dr. Gaspar Bidding for electrodiagnostic study of the Bilateral upper extremities.  Patient is Right hand dominant.  She reports chronic worsening severe right and left shoulder pain and neck pain with pain and paresthesia traveling down the right arm and into the hand and somewhat more of the radial digits somewhat more of a C6 or C7 combined distribution.  She has a history of cervical foraminal and central canal stenosis.  I have completed 2 epidurals in the past with temporary relief of her pain for several months.  She reports taking pain medications now to ease the symptoms.  She does state that it is a difficult situation with her to do activities of daily living to the symptoms that she is having.  Cervical MRI is reviewed again below.  She has not had electrodiagnostic studies or prior carpal tunnel surgery.  She does have a history of Graves' disease and at least a documented history of some type of neuropathy.   ROS Otherwise per HPI.  Assessment & Plan: Visit Diagnoses:    ICD-10-CM   1. 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   There is no significant electrodiagnostic evidence of any other focal nerve entrapment, brachial plexopathy or cervical radiculopathy. ** As you know, this particular electrodiagnostic study cannot rule out chemical  radiculitis or sensory only radiculopathy.  Recommendations: 1.  Follow-up with referring physician. 2.  Continue current management of symptoms.  If felt to be symptomatic carpal tunnel syndrome then ultrasound-guided injection could be performed.  She can have repeat cervical epidural injections from time to time and we went over this with her today.  She was somewhat confused about the ability to have repeat injections.  Meds & Orders: No orders of the defined types were placed in this encounter.   Orders Placed This Encounter  Procedures   NCV with EMG (electromyography)    Follow-up: Return in about 2 weeks (around 10/05/2021) for Gaspar Bidding, DO.   Procedures: No procedures performed  EMG & NCV Findings: Evaluation of the right median motor nerve showed decreased conduction velocity (Elbow-Wrist, 48 m/s).  The left median (across palm) sensory nerve showed prolonged distal peak latency (Wrist, 4.2 ms).  The right median (across palm) sensory nerve showed prolonged distal peak latency (Wrist, 4.8 ms), reduced amplitude (6.3 V), and prolonged distal peak latency (Palm, 2.1 ms).  The left ulnar sensory and the right ulnar sensory nerves showed reduced amplitude (L9.3, R6.6 V).  All remaining nerves (as indicated in the following tables) were within normal limits.  Left vs. Right side comparison data for the ulnar motor nerve indicates abnormal L-R velocity difference (A Elbow-B Elbow, 32 m/s).  All remaining left vs. right side differences were within normal limits.    All examined muscles (as indicated in the following table)  showed no evidence of electrical instability.    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   There is no significant electrodiagnostic evidence of any other focal nerve entrapment, brachial plexopathy or cervical radiculopathy. ** As you  know, this particular electrodiagnostic study cannot rule out chemical radiculitis or sensory only radiculopathy.  Recommendations: 1.  Follow-up with referring physician. 2.  Continue current management of symptoms.  If felt to be symptomatic carpal tunnel syndrome then ultrasound-guided injection could be performed.  She can have repeat cervical epidural injections from time to time and we went over this with her today.  She was somewhat confused about the ability to have repeat injections.  ___________________________ Naaman Plummer FAAPMR Board Certified, American Board of Physical Medicine and Rehabilitation    Nerve Conduction Studies Anti Sensory Summary Table   Stim Site NR Peak (ms) Norm Peak (ms) P-T Amp (V) Norm P-T Amp Site1 Site2 Delta-P (ms) Dist (cm) Vel (m/s) Norm Vel (m/s)  Left Median Acr Palm Anti Sensory (2nd Digit)  33C  Wrist    *4.2 <3.6 14.8 >10 Wrist Palm 2.2 0.0    Palm    2.0 <2.0 18.2         Right Median Acr Palm Anti Sensory (2nd Digit)  32C  Wrist    *4.8 <3.6 *6.3 >10 Wrist Palm 2.7 0.0    Palm    *2.1 <2.0 9.0         Left Radial Anti Sensory (Base 1st Digit)  32.7C  Wrist    2.4 <3.1 25.4  Wrist Base 1st Digit 2.4 0.0    Right Radial Anti Sensory (Base 1st Digit)  32.2C  Wrist    2.3 <3.1 31.0  Wrist Base 1st Digit 2.3 0.0    Left Ulnar Anti Sensory (5th Digit)  33C  Wrist    3.5 <3.7 *9.3 >15.0 Wrist 5th Digit 3.5 14.0 40 >38  Right Ulnar Anti Sensory (5th Digit)  32.5C  Wrist    3.6 <3.7 *6.6 >15.0 Wrist 5th Digit 3.6 14.0 39 >38   Motor Summary Table   Stim Site NR Onset (ms) Norm Onset (ms) O-P Amp (mV) Norm O-P Amp Site1 Site2 Delta-0 (ms) Dist (cm) Vel (m/s) Norm Vel (m/s)  Left Median Motor (Abd Poll Brev)  32.9C  Wrist    3.4 <4.2 5.9 >5 Elbow Wrist 4.5 22.5 50 >50  Elbow    7.9  5.7         Right Median Motor (Abd Poll Brev)  32.6C  Wrist    3.8 <4.2 6.3 >5 Elbow Wrist 4.9 23.5 *48 >50  Elbow    8.7  6.5         Left Ulnar Motor  (Abd Dig Min)  33.1C  Wrist    3.3 <4.2 6.6 >3 B Elbow Wrist 3.9 21.5 55 >53  B Elbow    7.2  6.6  A Elbow B Elbow 1.7 10.0 59 >53  A Elbow    8.9  6.5         Right Ulnar Motor (Abd Dig Min)  32.7C  Wrist    3.2 <4.2 8.4 >3 B Elbow Wrist 3.9 22.5 58 >53  B Elbow    7.1  8.0  A Elbow B Elbow 1.1 10.0 91 >53  A Elbow    8.2  7.9          EMG  Side Muscle Nerve Root Ins Act Fibs Psw Amp Dur Poly Recrt Int Dennie Bible Comment  Right Abd Poll Brev Median C8-T1 Nml Nml Nml Nml Nml 0 Nml Nml   Right 1stDorInt Ulnar C8-T1 Nml Nml Nml Nml Nml 0 Nml Nml   Right PronatorTeres Median C6-7 Nml Nml Nml Nml Nml 0 Nml Nml   Right Biceps Musculocut C5-6 Nml Nml Nml Nml Nml 0 Nml Nml   Right Deltoid Axillary C5-6 Nml Nml Nml Nml Nml 0 Nml Nml     Nerve Conduction Studies Anti Sensory Left/Right Comparison   Stim Site L Lat (ms) R Lat (ms) L-R Lat (ms) L Amp (V) R Amp (V) L-R Amp (%) Site1 Site2 L Vel (m/s) R Vel (m/s) L-R Vel (m/s)  Median Acr Palm Anti Sensory (2nd Digit)  33C  Wrist *4.2 *4.8 0.6 14.8 *6.3 57.4 Wrist Palm     Palm 2.0 *2.1 0.1 18.2 9.0 50.5       Radial Anti Sensory (Base 1st Digit)  32.7C  Wrist 2.4 2.3 0.1 25.4 31.0 18.1 Wrist Base 1st Digit     Ulnar Anti Sensory (5th Digit)  33C  Wrist 3.5 3.6 0.1 *9.3 *6.6 29.0 Wrist 5th Digit 40 39 1   Motor Left/Right Comparison   Stim Site L Lat (ms) R Lat (ms) L-R Lat (ms) L Amp (mV) R Amp (mV) L-R Amp (%) Site1 Site2 L Vel (m/s) R Vel (m/s) L-R Vel (m/s)  Median Motor (Abd Poll Brev)  32.9C  Wrist 3.4 3.8 0.4 5.9 6.3 6.3 Elbow Wrist 50 *48 2  Elbow 7.9 8.7 0.8 5.7 6.5 12.3       Ulnar Motor (Abd Dig Min)  33.1C  Wrist 3.3 3.2 0.1 6.6 8.4 21.4 B Elbow Wrist 55 58 3  B Elbow 7.2 7.1 0.1 6.6 8.0 17.5 A Elbow B Elbow 59 91 *32  A Elbow 8.9 8.2 0.7 6.5 7.9 17.7          Waveforms:                      Clinical History: Multiplanar, multisequence MR imaging obtained through the cervical spine without intravenous  administration of contrast on 10/19/2020 12:51 PM.   CONTRAST: None.   FINDINGS:  #  Osseous structures: Vertebral body heights are maintained. No acute fracture or concerning marrow signal abnormality.  #  Alignment: Alignment maintained.  #  Cervicomedullary junction: Unremarkable. No cerebellar tonsillar ectopia.  #  Spinal cord: No intrinsic spinal cord abnormality.   #  C2-C3: No significant disc herniation, spinal canal, or neuroforaminal compromise.  #  C3-C4: Facet ankylosing on the RIGHT. Mild uncovertebral disease on the RIGHT. This is producing mild RIGHT neuroforaminal stenosis. The LEFT neuroforamen and spinal canal are patent.  #  C4-C5: Bilateral uncovertebral disease producing mild RIGHT neuroforaminal stenosis. LEFT neuroforamen and spinal canal are patent.  #  C5-C6: Loss of disc height with a posterior disc osteophytic complex and uncovertebral disease. This results in mild effacement of the thecal sac and moderate bilateral neuroforaminal stenosis.  #  C6-C7: Loss of disc height with a posterior disc osteophytic complex and uncovertebral disease. There is ligamentum flavum redundancy. This results in moderate spinal canal narrowing. Moderate LEFT and mild RIGHT neuroforaminal stenosis.  #  C7-T1: Posterior central disc protrusion producing effacement of the ventral thecal sac. Neuroforamen are patent.   #  Paraspinal tissues: Unremarkable   #  Additional comments: Multiple small nodules in the  thyroid gland.    IMPRESSION:  1.  Cervical spondylosis and facet arthrosis as described above. This is most notable for moderate bilateral neuroforaminal stenosis and mild spinal canal narrowing at C5-C6; moderate spinal canal narrowing and moderate LEFT and mild RIGHT neuroforaminal stenosis at C6-C7.  2.  Multiple small nodules in the thyroid gland. Recommend correlation with prior imaging or ultrasound for further characterization.   Electronically Signed by: Ardyth Man on  10/19/2020 2:23 PM     Objective:  VS:  HT:    WT:   BMI:     BP:   HR: bpm  TEMP: ( )  RESP:  Physical Exam Musculoskeletal:        General: No swelling, tenderness or deformity.     Comments: Inspection reveals no atrophy of the bilateral APB or FDI or hand intrinsics. There is no swelling, color changes, allodynia or dystrophic changes. There is 5 out of 5 strength in the bilateral wrist extension, finger abduction and long finger flexion. There is intact sensation to light touch in all dermatomal and peripheral nerve distributions. There is a negative Phalen's test bilaterally. There is a negative Hoffmann's test bilaterally.  Skin:    General: Skin is warm and dry.     Findings: No erythema or rash.  Neurological:     General: No focal deficit present.     Mental Status: She is alert and oriented to person, place, and time.     Motor: No weakness or abnormal muscle tone.     Coordination: Coordination normal.  Psychiatric:        Mood and Affect: Mood normal.        Behavior: Behavior normal.      Imaging: No results found.

## 2021-09-27 NOTE — Procedures (Signed)
EMG & NCV Findings: Evaluation of the right median motor nerve showed decreased conduction velocity (Elbow-Wrist, 48 m/s).  The left median (across palm) sensory nerve showed prolonged distal peak latency (Wrist, 4.2 ms).  The right median (across palm) sensory nerve showed prolonged distal peak latency (Wrist, 4.8 ms), reduced amplitude (6.3 V), and prolonged distal peak latency (Palm, 2.1 ms).  The left ulnar sensory and the right ulnar sensory nerves showed reduced amplitude (L9.3, R6.6 V).  All remaining nerves (as indicated in the following tables) were within normal limits.  Left vs. Right side comparison data for the ulnar motor nerve indicates abnormal L-R velocity difference (A Elbow-B Elbow, 32 m/s).  All remaining left vs. right side differences were within normal limits.    All examined muscles (as indicated in the following table) showed no evidence of electrical instability.    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   There is no significant electrodiagnostic evidence of any other focal nerve entrapment, brachial plexopathy or cervical radiculopathy. ** As you know, this particular electrodiagnostic study cannot rule out chemical radiculitis or sensory only radiculopathy.  Recommendations: 1.  Follow-up with referring physician. 2.  Continue current management of symptoms.  If felt to be symptomatic carpal tunnel syndrome then ultrasound-guided injection could be performed.  She can have repeat cervical epidural injections from time to time and we went over this with her today.  She was somewhat confused about the ability to have repeat injections.  ___________________________ Naaman Plummer FAAPMR Board Certified, American Board of Physical Medicine and Rehabilitation    Nerve Conduction Studies Anti Sensory Summary Table   Stim Site NR Peak (ms) Norm  Peak (ms) P-T Amp (V) Norm P-T Amp Site1 Site2 Delta-P (ms) Dist (cm) Vel (m/s) Norm Vel (m/s)  Left Median Acr Palm Anti Sensory (2nd Digit)  33C  Wrist    *4.2 <3.6 14.8 >10 Wrist Palm 2.2 0.0    Palm    2.0 <2.0 18.2         Right Median Acr Palm Anti Sensory (2nd Digit)  32C  Wrist    *4.8 <3.6 *6.3 >10 Wrist Palm 2.7 0.0    Palm    *2.1 <2.0 9.0         Left Radial Anti Sensory (Base 1st Digit)  32.7C  Wrist    2.4 <3.1 25.4  Wrist Base 1st Digit 2.4 0.0    Right Radial Anti Sensory (Base 1st Digit)  32.2C  Wrist    2.3 <3.1 31.0  Wrist Base 1st Digit 2.3 0.0    Left Ulnar Anti Sensory (5th Digit)  33C  Wrist    3.5 <3.7 *9.3 >15.0 Wrist 5th Digit 3.5 14.0 40 >38  Right Ulnar Anti Sensory (5th Digit)  32.5C  Wrist    3.6 <3.7 *6.6 >15.0 Wrist 5th Digit 3.6 14.0 39 >38   Motor Summary Table   Stim Site NR Onset (ms) Norm Onset (ms) O-P Amp (mV) Norm O-P Amp Site1 Site2 Delta-0 (ms) Dist (cm) Vel (m/s) Norm Vel (m/s)  Left Median Motor (Abd Poll Brev)  32.9C  Wrist    3.4 <4.2 5.9 >5 Elbow Wrist 4.5 22.5 50 >50  Elbow    7.9  5.7         Right Median Motor (Abd Poll Brev)  32.6C  Wrist    3.8 <4.2 6.3 >5 Elbow Wrist 4.9 23.5 *  48 >50  Elbow    8.7  6.5         Left Ulnar Motor (Abd Dig Min)  33.1C  Wrist    3.3 <4.2 6.6 >3 B Elbow Wrist 3.9 21.5 55 >53  B Elbow    7.2  6.6  A Elbow B Elbow 1.7 10.0 59 >53  A Elbow    8.9  6.5         Right Ulnar Motor (Abd Dig Min)  32.7C  Wrist    3.2 <4.2 8.4 >3 B Elbow Wrist 3.9 22.5 58 >53  B Elbow    7.1  8.0  A Elbow B Elbow 1.1 10.0 91 >53  A Elbow    8.2  7.9          EMG   Side Muscle Nerve Root Ins Act Fibs Psw Amp Dur Poly Recrt Int Dennie Bible Comment  Right Abd Poll Brev Median C8-T1 Nml Nml Nml Nml Nml 0 Nml Nml   Right 1stDorInt Ulnar C8-T1 Nml Nml Nml Nml Nml 0 Nml Nml   Right PronatorTeres Median C6-7 Nml Nml Nml Nml Nml 0 Nml Nml   Right Biceps Musculocut C5-6 Nml Nml Nml Nml Nml 0 Nml Nml   Right Deltoid Axillary C5-6  Nml Nml Nml Nml Nml 0 Nml Nml     Nerve Conduction Studies Anti Sensory Left/Right Comparison   Stim Site L Lat (ms) R Lat (ms) L-R Lat (ms) L Amp (V) R Amp (V) L-R Amp (%) Site1 Site2 L Vel (m/s) R Vel (m/s) L-R Vel (m/s)  Median Acr Palm Anti Sensory (2nd Digit)  33C  Wrist *4.2 *4.8 0.6 14.8 *6.3 57.4 Wrist Palm     Palm 2.0 *2.1 0.1 18.2 9.0 50.5       Radial Anti Sensory (Base 1st Digit)  32.7C  Wrist 2.4 2.3 0.1 25.4 31.0 18.1 Wrist Base 1st Digit     Ulnar Anti Sensory (5th Digit)  33C  Wrist 3.5 3.6 0.1 *9.3 *6.6 29.0 Wrist 5th Digit 40 39 1   Motor Left/Right Comparison   Stim Site L Lat (ms) R Lat (ms) L-R Lat (ms) L Amp (mV) R Amp (mV) L-R Amp (%) Site1 Site2 L Vel (m/s) R Vel (m/s) L-R Vel (m/s)  Median Motor (Abd Poll Brev)  32.9C  Wrist 3.4 3.8 0.4 5.9 6.3 6.3 Elbow Wrist 50 *48 2  Elbow 7.9 8.7 0.8 5.7 6.5 12.3       Ulnar Motor (Abd Dig Min)  33.1C  Wrist 3.3 3.2 0.1 6.6 8.4 21.4 B Elbow Wrist 55 58 3  B Elbow 7.2 7.1 0.1 6.6 8.0 17.5 A Elbow B Elbow 59 91 *32  A Elbow 8.9 8.2 0.7 6.5 7.9 17.7          Waveforms:

## 2021-10-05 ENCOUNTER — Encounter: Payer: Self-pay | Admitting: Family Medicine

## 2021-10-05 ENCOUNTER — Ambulatory Visit (INDEPENDENT_AMBULATORY_CARE_PROVIDER_SITE_OTHER): Payer: Commercial Managed Care - PPO | Admitting: Family Medicine

## 2021-10-05 VITALS — BP 118/70 | HR 70 | Temp 97.6°F | Ht 71.0 in | Wt 232.6 lb

## 2021-10-05 DIAGNOSIS — E0500 Thyrotoxicosis with diffuse goiter without thyrotoxic crisis or storm: Secondary | ICD-10-CM

## 2021-10-05 DIAGNOSIS — E785 Hyperlipidemia, unspecified: Secondary | ICD-10-CM | POA: Diagnosis not present

## 2021-10-05 DIAGNOSIS — Z0000 Encounter for general adult medical examination without abnormal findings: Secondary | ICD-10-CM | POA: Diagnosis not present

## 2021-10-05 DIAGNOSIS — I10 Essential (primary) hypertension: Secondary | ICD-10-CM | POA: Diagnosis not present

## 2021-10-05 DIAGNOSIS — R7401 Elevation of levels of liver transaminase levels: Secondary | ICD-10-CM

## 2021-10-05 LAB — COMPREHENSIVE METABOLIC PANEL
ALT: 226 U/L — ABNORMAL HIGH (ref 0–35)
AST: 106 U/L — ABNORMAL HIGH (ref 0–37)
Albumin: 4.4 g/dL (ref 3.5–5.2)
Alkaline Phosphatase: 70 U/L (ref 39–117)
BUN: 20 mg/dL (ref 6–23)
CO2: 29 mEq/L (ref 19–32)
Calcium: 9.6 mg/dL (ref 8.4–10.5)
Chloride: 107 mEq/L (ref 96–112)
Creatinine, Ser: 0.84 mg/dL (ref 0.40–1.20)
GFR: 73.94 mL/min (ref >60.00)
Glucose, Bld: 87 mg/dL (ref 70–99)
Potassium: 4.5 mEq/L (ref 3.5–5.1)
Sodium: 142 mEq/L (ref 135–145)
Total Bilirubin: 0.7 mg/dL (ref 0.2–1.2)
Total Protein: 6.8 g/dL (ref 6.0–8.3)

## 2021-10-05 LAB — CBC WITH DIFFERENTIAL/PLATELET
Basophils Absolute: 0 10*3/uL (ref 0.0–0.1)
Basophils Relative: 0.9 % (ref 0.0–3.0)
Eosinophils Absolute: 0.2 10*3/uL (ref 0.0–0.7)
Eosinophils Relative: 4.3 % (ref 0.0–5.0)
HCT: 41.6 % (ref 36.0–46.0)
Hemoglobin: 14 g/dL (ref 12.0–15.0)
Lymphocytes Relative: 23.4 % (ref 12.0–46.0)
Lymphs Abs: 0.9 10*3/uL (ref 0.7–4.0)
MCHC: 33.7 g/dL (ref 30.0–36.0)
MCV: 96.5 fl (ref 78.0–100.0)
Monocytes Absolute: 0.4 10*3/uL (ref 0.1–1.0)
Monocytes Relative: 8.9 % (ref 3.0–12.0)
Neutro Abs: 2.5 10*3/uL (ref 1.4–7.7)
Neutrophils Relative %: 62.5 % (ref 43.0–77.0)
Platelets: 222 10*3/uL (ref 150.0–400.0)
RBC: 4.31 Mil/uL (ref 3.87–5.11)
RDW: 13.1 % (ref 11.5–15.5)
WBC: 4 10*3/uL (ref 4.0–10.5)

## 2021-10-05 LAB — LIPID PANEL
Cholesterol: 241 mg/dL — ABNORMAL HIGH (ref 0–200)
HDL: 62.9 mg/dL (ref >39.00)
LDL Cholesterol: 158 mg/dL — ABNORMAL HIGH (ref 0–99)
NonHDL: 177.91
Total CHOL/HDL Ratio: 4
Triglycerides: 102 mg/dL (ref 0.0–149.0)
VLDL: 20.4 mg/dL (ref 0.0–40.0)

## 2021-10-05 LAB — T3, FREE: T3, Free: 2.6 pg/mL (ref 2.3–4.2)

## 2021-10-05 LAB — T4, FREE: Free T4: 0.82 ng/dL (ref 0.60–1.60)

## 2021-10-05 LAB — TSH: TSH: 2.53 u[IU]/mL (ref 0.35–5.50)

## 2021-10-05 NOTE — Addendum Note (Signed)
Addended by: Shelva Majestic on: 10/05/2021 01:44 PM   Modules accepted: Orders

## 2021-10-05 NOTE — Progress Notes (Signed)
Phone [phone]   Subjective:  Patient presents today for their annual physical. Chief complaint-noted.   See problem oriented charting- ROS- full  review of systems was completed and negative Per full ROS sheet completed by patient   The following were reviewed and entered/updated in epic: Past Medical History:  Diagnosis Date   Arthritis    cervical, lumbar, knees, feet. mild hands   Headache(784.0)    Hx: of occasinal Migraine   Heart palpitations    Hx: of   Hypertension    PONV (postoperative nausea and vomiting)    Patient Active Problem List   Diagnosis Date Noted   Graves disease 07/21/2021    Priority: High   Hyperlipidemia 07/19/2017    Priority: Medium    Hyperglycemia 07/19/2017    Priority: Medium    Palpitations 05/28/2017    Priority: Medium    Vertigo 05/28/2017    Priority: Medium    Hypertension 05/02/2016    Priority: Medium    Diffuse pain 05/02/2016    Priority: Medium    Arthritis     Priority: Medium    Neuropathy 09/25/2018    Priority: Low   Adhesive capsulitis of left shoulder 01/30/2017    Priority: Low   Family history of colon cancer 05/02/2016    Priority: Low   Microscopic hematuria 10/16/2011    Priority: Low   Past Surgical History:  Procedure Laterality Date   [place] N/A 02/28/2013   Procedure: LAPAROSCOPIC CHOLECYSTECTOMY;  Surgeon: Ralene Ok, MD;  Location: [facility];  Service: General;  Laterality: N/A;   COLONOSCOPY W/ POLYPECTOMY     Hx: of   KNEE SURGERY     Left knee - meniscus tear   LAPAROSCOPIC APPENDECTOMY N/A 08/05/2012   Procedure: APPENDECTOMY LAPAROSCOPIC;  Surgeon: Joyice Faster. Cornett, MD;  Location: [facility];  Service: General;  Laterality: N/A;   wisdom teeth      Family History  Problem Relation Age of Onset   Uterine cancer Mother        presented with bleeding   Hypertension Mother    Pancreatic cancer Father    Cancer - Colon Brother     Stroke Maternal Grandmother         late 53s   Lung cancer Maternal Grandfather    Heart attack Paternal Grandmother        late 61s   Heart attack Paternal Grandfather        died 75    Medications- reviewed and updated Current Outpatient Medications  Medication Sig Dispense Refill   diazepam (VALIUM) 5 MG tablet Take 1 tablet (5 mg total) by mouth every 12 (twelve) hours as needed for anxiety (do not drive for 12 hours after taking). 11 tablet 0   gabapentin (NEURONTIN) 300 MG capsule Take 300 mg by mouth 3 (three) times daily.     methimazole (TAPAZOLE) 5 MG tablet Take 1 tablet (5 mg total) by mouth daily. 90 tablet 2   metoprolol succinate (TOPROL-XL) 50 MG 24 hr tablet TAKE 1 TABLET BY MOUTH EVERY DAY WITH OR IMMEDIATELY FOLLOWING A MEAL 91 tablet 3   PENNSAID 2 % SOLN SMARTSIG:2 Pump Topical Twice Daily     tiZANidine (ZANAFLEX) 4 MG tablet Take 4 mg by mouth 2 (two) times daily.     Vitamin D, Cholecalciferol, 25 MCG (1000 UT) CAPS Take 1 capsule by mouth daily in the afternoon.     No  current facility-administered medications for this visit.    Allergies-reviewed and updated Allergies  Allergen Reactions   Penicillins Itching    Pt states this may be an error and that she "was given a derivative" of PCN with no reactions at all.    Social History   Social History Narrative   Married 1980. [name] is husband Marketing executive).3 kids Lavon Paganini. Works at [employer] lives with parents, Wandalee Ferdinand single living with parents works at [employer], Psychologist, counselling works at [employer] and in school in [place] living with parents.       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 Oropharynx normal Neck: no thyromegaly CV: RRR no murmurs rubs or gallops Lungs: CTAB no crackles, wheeze, rhonchi Abdomen: soft/nontender/nondistended/normal bowel sounds. No rebound or guarding.  Ext: no edema Skin: warm, dry Neuro: grossly normal, moves all extremities, PERRLA   Assessment and Plan   61 y.o. female presenting for annual physical.  Health Maintenance counseling: 1. Anticipatory guidance: Patient counseled regarding regular dental exams -q6 months, eye exams - yearly,  avoiding smoking and second hand smoke , limiting alcohol to 1 beverage per day , no illicit drugs .   2. Risk factor reduction:  Advised patient of need for regular exercise and diet rich and fruits and vegetables to reduce risk of heart attack and stroke.  Exercise- swimming weekends and doing 1-2 nights a week- plus wants to add some walking- early morning to avoid heat- some walking daily 20 mins at work.  Diet/weight management-trying to reduce portion sizes- mild weight gain- encouraged continued efforts. We will check thyroid with weight loss trouble Wt Readings from Last 3 Encounters:  10/05/21 232 lb 9.6 oz (105.5 kg)  07/21/21 229 lb 12.8 oz (104.2 kg)  07/01/21 229 lb (103.9 kg)  3. Immunizations/screenings/ancillary studies-declines Shingrix for now- has had shingles in past, discussed considering COVID-19 vaccination in the fall- wants to hold off .   Immunization History  Administered Date(s) Administered   Influenza,inj,Quad PF,6+ Mos 05/02/2016, 12/25/2020   Influenza-Unspecified 02/13/2019   Janssen (J&J) SARS-COV-2 Vaccination 06/19/2019   PFIZER(Purple Top)SARS-COV-2 Vaccination 03/17/2020   Tdap 05/02/2016   4. Cervical cancer screening- follows with Dr. 05/04/2016.  Pap 07/20/2017 with HPV negative-  will try to get updated records next year  5. Breast cancer screening-  breast exam with GYN and mammogram - yearly- we will try  to get copy 6. Colon cancer screening - 01/07/2019 with 5-year follow-up due to brothers history-follows with Dr. 01/09/2019- will try to get records 7. Skin cancer screening-follows with skin surgery center yearly. advised regular sunscreen use. Denies worrisome, changing, or new skin lesions.  8. Birth control/STD check- monogamous/postmenopausal 9. Osteoporosis screening at 54- has had with GYN in the past and reported reassuring readings 10. Smoking associated screening -never smoker  Status of chronic or acute concerns   #Social update- has taken some small healthy steps to set boundaries at work  #Graves' disease S: Presented with palpitations-seen by endocrinology Dr. 76 and started on methimazole 5 mg to be  titrated as needed Lab Results  Component Value Date   TSH 0.44 07/27/2021  A/P:has been stable but with recent weight gain- will check levels to make sure still stable- tsh, t3, t4   #hypertension S: medication: metoprolol 50 mg XR - is on nsaids but BP has looked good BP Readings from Last 3 Encounters:  10/05/21 118/70  07/21/21 120/72  07/01/21 112/70  A/P: Controlled. Continue current medications.  - encouraged her to check BP if takes nsaids  #hyperlipidemia S: Medication:none  The 10-year ASCVD risk score (Arnett DK, et al., 2019) is: 4.6% - no first degree relatives with early MI Lab Results  Component Value Date   CHOL 201 (H) 10/04/2020   HDL 67.50 10/04/2020   LDLCALC 119 (H) 10/04/2020   TRIG 73.0 10/04/2020   CHOLHDL 3 10/04/2020   A/P: update lipids- if risk remains below 7.5% hold off on statin   #Idiopathic neuropathy in her feet-history of nerve conduction studies in the past with unclear cause but started after knee surgery. S: Medication: Gabapentin trial for feet helped this some  Salicylic acid 6% solution for calluses was actually helpful- trial 2021 was not helpful -has not worked getting off (mainly for shoulder lately). Still having  feet issues A/P: doing reasonably well- continue current meds    #Vertigo-years and years of issues.  Cannot do a flip turn with swimming due to this.  Follows with chiropractor.  She has not tolerated home exercises and has declined vestibular rehab (she is anxious about SE and wants to hold off) as concerned about triggering episodes- sometimes cant do PT exercises due to this for shoulder and neck or even manipulation   #Right shoulder pain-patient with history of left shoulder frozen shoulder.  In 2021 and unfortunately her right shoulder was bothering her-plan was to get back in with Dr. Berline Chough if possible with insurance-still strugglingwith this- will get MRI with dye she reports- working on getting this scheduled- ongoing frozen shoulder- fair amount of pain with this- was given antiinflammatory and helpful for pain  #Digestion concerns- feels like food almost sits in top of stomach. She feels high stress- if relaxes before eating it helps. No burning sensation. Could be mild reflux as well. Will let us know if worsens. Has had constipation in the past- harder BMS lately- still doing her apple all day long once a week and helps- may try twice a week. Feels like eating fruits/veggies. Not worse- had noted 09/25/2018.   #Left Hip pain-working with chiropractor- exercises keep at bay- feels pool helps  # Hyperglycemia/insulin resistance/prediabetes- a1c not elevated 2020 though at 5.1 intermittently has CBG elevations -Continue to trend CBGs yearly  Lab Results  Component Value Date   HGBA1C 5.1 09/25/2018   Recommended follow up: Return in about 6 months (around 04/07/2022) for followup or sooner if needed.Schedule b4 you leave. Future Appointments  Date Time Provider Department Center  11/09/2021  7:30 AM Shamleffer, Konrad Dolores, MD [facility] None   Lab/Order associations: fasting   ICD-10-CM   1. Preventative health care  Z00.00     2. Hyperlipidemia, unspecified hyperlipidemia  type  E78.5     3. Graves disease  E05.00     4. Primary hypertension  I10       No orders of the defined types were placed in this encounter.   Return precautions advised.  Tana Conch, MD

## 2021-10-05 NOTE — Patient Instructions (Addendum)
Sign release of information at the check out desk for mammogram through Dr. Henderson Cloud. We have record of your visit- we just need a copy of the actual mammogram report  Sign release of information at the check out desk for colonoscopy from Dr. Dulce Sellar  Please stop by lab before you go If you have mychart- we will send your results within 3 business days of [place] receiving them.  If you do not have mychart- we will call you about results within 5 business days of [place] receiving them.  *please also note that you will see labs on mychart as soon as they post. I will later go in and write notes on them- will say "notes from Dr. Durene Cal"   Recommended follow up: Return in about 6 months (around 04/07/2022) for followup or sooner if needed.Schedule b4 you leave.

## 2021-10-06 ENCOUNTER — Telehealth: Payer: Self-pay | Admitting: Family Medicine

## 2021-10-06 NOTE — Telephone Encounter (Signed)
From Dr. Lonzo Cloud "After sleeping on Nicole Campos's elevated LFT's I would suggest STOPPING Methimazole all together until the ultrasound is done.    I will be out of the office the next two weeks,but in case she become hyperthyroid I would recommend atenolol 25 mg daily if her TSH trends down to low again . I will try to see her upon my return to discuss alternatives.  "  Spoke to patient and she will stop methimazole and look out for prior hyperthyroid symptoms. [place] already ordered.

## 2021-10-10 ENCOUNTER — Ambulatory Visit
Admission: RE | Admit: 2021-10-10 | Discharge: 2021-10-10 | Disposition: A | Discharge status: Home / Self Care | Payer: Commercial Managed Care - PPO | Source: Ambulatory Visit | Attending: Family Medicine | Admitting: Family Medicine

## 2021-10-10 DIAGNOSIS — R7401 Elevation of levels of liver transaminase levels: Secondary | ICD-10-CM

## 2021-10-11 ENCOUNTER — Other Ambulatory Visit: Payer: Self-pay

## 2021-10-11 DIAGNOSIS — R7989 Other specified abnormal findings of blood chemistry: Secondary | ICD-10-CM

## 2021-10-12 ENCOUNTER — Other Ambulatory Visit: Payer: Self-pay

## 2021-10-12 DIAGNOSIS — R7989 Other specified abnormal findings of blood chemistry: Secondary | ICD-10-CM

## 2021-10-19 ENCOUNTER — Other Ambulatory Visit: Payer: Self-pay | Admitting: Sports Medicine

## 2021-10-19 DIAGNOSIS — G8929 Other chronic pain: Secondary | ICD-10-CM

## 2021-10-19 DIAGNOSIS — M7501 Adhesive capsulitis of right shoulder: Secondary | ICD-10-CM

## 2021-10-19 DIAGNOSIS — M25511 Pain in right shoulder: Secondary | ICD-10-CM

## 2021-10-20 ENCOUNTER — Other Ambulatory Visit (INDEPENDENT_AMBULATORY_CARE_PROVIDER_SITE_OTHER): Payer: Commercial Managed Care - PPO

## 2021-10-20 DIAGNOSIS — R7989 Other specified abnormal findings of blood chemistry: Secondary | ICD-10-CM

## 2021-10-20 LAB — COMPREHENSIVE METABOLIC PANEL
ALT: 161 U/L — ABNORMAL HIGH (ref 0–35)
AST: 72 U/L — ABNORMAL HIGH (ref 0–37)
Albumin: 4.4 g/dL (ref 3.5–5.2)
Alkaline Phosphatase: 69 U/L (ref 39–117)
BUN: 17 mg/dL (ref 6–23)
CO2: 29 mEq/L (ref 19–32)
Calcium: 9.3 mg/dL (ref 8.4–10.5)
Chloride: 105 mEq/L (ref 96–112)
Creatinine, Ser: 0.82 mg/dL (ref 0.40–1.20)
GFR: 76.09 mL/min (ref >60.00)
Glucose, Bld: 82 mg/dL (ref 70–99)
Potassium: 3.9 mEq/L (ref 3.5–5.1)
Sodium: 142 mEq/L (ref 135–145)
Total Bilirubin: 0.9 mg/dL (ref 0.2–1.2)
Total Protein: 6.5 g/dL (ref 6.0–8.3)

## 2021-10-21 ENCOUNTER — Other Ambulatory Visit: Payer: Self-pay

## 2021-10-21 DIAGNOSIS — R7989 Other specified abnormal findings of blood chemistry: Secondary | ICD-10-CM

## 2021-11-09 ENCOUNTER — Encounter: Payer: Self-pay | Admitting: Internal Medicine

## 2021-11-09 ENCOUNTER — Ambulatory Visit: Payer: Commercial Managed Care - PPO | Admitting: Internal Medicine

## 2021-11-09 VITALS — BP 110/70 | HR 78 | Ht 71.0 in | Wt 231.0 lb

## 2021-11-09 DIAGNOSIS — E0500 Thyrotoxicosis with diffuse goiter without thyrotoxic crisis or storm: Secondary | ICD-10-CM | POA: Diagnosis not present

## 2021-11-09 DIAGNOSIS — E0590 Thyrotoxicosis, unspecified without thyrotoxic crisis or storm: Secondary | ICD-10-CM

## 2021-11-09 DIAGNOSIS — R7989 Other specified abnormal findings of blood chemistry: Secondary | ICD-10-CM

## 2021-11-09 DIAGNOSIS — E049 Nontoxic goiter, unspecified: Secondary | ICD-10-CM

## 2021-11-09 LAB — CBC
HCT: 42.4 % (ref 36.0–46.0)
Hemoglobin: 14.2 g/dL (ref 12.0–15.0)
MCHC: 33.5 g/dL (ref 30.0–36.0)
MCV: 97.3 fl (ref 78.0–100.0)
Platelets: 244 10*3/uL (ref 150.0–400.0)
RBC: 4.36 Mil/uL (ref 3.87–5.11)
RDW: 13 % (ref 11.5–15.5)
WBC: 4.3 10*3/uL (ref 4.0–10.5)

## 2021-11-09 LAB — COMPREHENSIVE METABOLIC PANEL
ALT: 156 U/L — ABNORMAL HIGH (ref 0–35)
AST: 81 U/L — ABNORMAL HIGH (ref 0–37)
Albumin: 4.3 g/dL (ref 3.5–5.2)
Alkaline Phosphatase: 67 U/L (ref 39–117)
BUN: 18 mg/dL (ref 6–23)
CO2: 29 mEq/L (ref 19–32)
Calcium: 9.8 mg/dL (ref 8.4–10.5)
Chloride: 107 mEq/L (ref 96–112)
Creatinine, Ser: 0.8 mg/dL (ref 0.40–1.20)
GFR: 78.34 mL/min (ref >60.00)
Glucose, Bld: 98 mg/dL (ref 70–99)
Potassium: 5 mEq/L (ref 3.5–5.1)
Sodium: 143 mEq/L (ref 135–145)
Total Bilirubin: 0.7 mg/dL (ref 0.2–1.2)
Total Protein: 7.1 g/dL (ref 6.0–8.3)

## 2021-11-09 LAB — T4, FREE: Free T4: 1.09 ng/dL (ref 0.60–1.60)

## 2021-11-09 LAB — TSH: TSH: 0.22 u[IU]/mL — ABNORMAL LOW (ref 0.35–5.50)

## 2021-11-09 NOTE — Progress Notes (Unsigned)
-3   Name: Nicole Campos  MRN/ DOB: [id number], 08-21-1960    Age/ Sex: 61 y.o., female    PCP: Marin Olp, MD   Reason for Endocrinology Evaluation: Hyperthyroid     Date of Initial Endocrinology Evaluation: 07/01/2021    HPI: [name] is a 61 y.o. female with a past medical history of HTN, neuropathy. The patient presented for initial endocrinology clinic visit on 07/01/2021 for consultative assistance with her hyperthyroid.   Patient has been diagnosed with hyperthyroidism in March 2023 when she presented to her PCP with tachycardia and palpitations.  She was also noted to have elevated TRAb at 2.45 IU/L  Thyroid ultrasound on 07/01/2021 showed multiple thyroid nodules, none met FNA criteria  No Fh of thyroid disease   She was started on methimazole 5 mg daily, by October 05, 2021 her AST was elevated from 19 to 106 U/L and ALT increased from 22 to 226 U/L with normal alkaline phosphatase at 70.   Abdominal ultrasound revealed a picture consistent with hepatic steatosis  LFTs improved with discontinuation of methimazole  SUBJECTIVE:    Today (11/09/21): [name] is here for a follow up on hyperthyroidism and Graves' disease.   Has been off methimazole since July,2023 Weight overall stable , she is working on weight loss  Denies local neck swelling  Denies constipation or loose stools  Right hand tremors noted every once in a while  She just started seeing a mental health specialist for anxiety  Denies Biotin  Still dealing with right should pain  also dealing with right foot plantar fasciitis     HISTORY:  Past Medical History:  Past Medical History:  Diagnosis Date   Arthritis    cervical, lumbar, knees, feet. mild hands   Headache(784.0)    Hx: of occasinal Migraine   Heart palpitations    Hx: of   Hypertension    PONV (postoperative nausea and vomiting)    Past Surgical History:  Past Surgical History:  Procedure Laterality Date    [place] N/A 02/28/2013   Procedure: LAPAROSCOPIC CHOLECYSTECTOMY;  Surgeon: Ralene Ok, MD;  Location: [facility];  Service: General;  Laterality: N/A;   COLONOSCOPY W/ POLYPECTOMY     Hx: of   KNEE SURGERY     Left knee - meniscus tear   LAPAROSCOPIC APPENDECTOMY N/A 08/05/2012   Procedure: APPENDECTOMY LAPAROSCOPIC;  Surgeon: Joyice Faster. Cornett, MD;  Location: [facility];  Service: General;  Laterality: N/A;   wisdom teeth      Social History:  reports that she has never smoked. She has never used smokeless tobacco. She reports current alcohol use of about 3.0 standard drinks of alcohol per week. She reports that she does not use drugs. Family History: family history includes Cancer - Colon in her brother; Heart attack in her paternal grandfather and paternal grandmother; Hypertension in her mother; Lung cancer in her maternal grandfather; Pancreatic cancer in her father; Stroke in her maternal grandmother; Uterine cancer in her mother.   HOME MEDICATIONS: Allergies as of 11/09/2021       Reactions   Penicillins Itching   Pt states this may be an error and that she "was given a derivative" of PCN with no reactions at all.        Medication List        Accurate as of November 09, 2021  6:48 AM. If you have any questions,  ask your nurse or doctor.          diazepam 5 MG tablet Commonly known as: VALIUM Take 1 tablet (5 mg total) by mouth every 12 (twelve) hours as needed for anxiety (do not drive for 12 hours after taking).   gabapentin 300 MG capsule Commonly known as: NEURONTIN Take 300 mg by mouth 3 (three) times daily.   methimazole 5 MG tablet Commonly known as: TAPAZOLE Take 1 tablet (5 mg total) by mouth daily.   metoprolol succinate 50 MG 24 hr tablet Commonly known as: TOPROL-XL TAKE 1 TABLET BY MOUTH EVERY DAY WITH OR IMMEDIATELY FOLLOWING A MEAL   Pennsaid 2 % Soln Generic drug: diclofenac Sodium SMARTSIG:2  Pump Topical Twice Daily   tiZANidine 4 MG tablet Commonly known as: ZANAFLEX Take 4 mg by mouth 2 (two) times daily.   Vitamin D (Cholecalciferol) 25 MCG (1000 UT) Caps Take 1 capsule by mouth daily in the afternoon.          REVIEW OF SYSTEMS: A comprehensive ROS was conducted with the patient and is negative except as per HPI    OBJECTIVE:  VS: There were no vitals taken for this visit.   Wt Readings from Last 3 Encounters:  10/05/21 232 lb 9.6 oz (105.5 kg)  07/21/21 229 lb 12.8 oz (104.2 kg)  07/01/21 229 lb (103.9 kg)     EXAM: General: Pt appears well and is in NAD  Eyes: External eye exam normal without stare, lid lag or exophthalmos.  EOM intact.  PERRL.  Neck: General: Supple without adenopathy. Thyroid: Thyroid size normal.  No goiter or nodules appreciated. No thyroid bruit.  Lungs: Clear with good BS bilat with no rales, rhonchi, or wheezes  Heart: Auscultation: RRR.  Abdomen: Normoactive bowel sounds, soft, nontender, without masses or organomegaly palpable  Extremities:  BL LE: No pretibial edema normal ROM and strength.  Mental Status: Judgment, insight: Intact Orientation: Oriented to time, place, and person Memory: Intact for recent and remote events Mood and affect: No depression, anxiety, or agitation     DATA REVIEWED:  Latest Reference Range & Units 06/10/21 08:57  TSH 0.35 - 5.50 uIU/mL 0.02 (L)  Triiodothyronine,Free,Serum 2.3 - 4.2 pg/mL 3.3  T4,Free(Direct) 0.60 - 1.60 ng/dL 0.95  (L): Data is abnormally low   Latest Reference Range & Units 06/07/21 16:24  Sodium 135 - 145 mEq/L 139  Potassium 3.5 - 5.1 mEq/L 4.2  Chloride 96 - 112 mEq/L 103  CO2 19 - 32 mEq/L 27  Glucose 70 - 99 mg/dL 89  BUN 6 - 23 mg/dL 18  Creatinine 0.40 - 1.20 mg/dL 0.85  Calcium 8.4 - 10.5 mg/dL 10.4  Alkaline Phosphatase 39 - 117 U/L 76  Albumin 3.5 - 5.2 g/dL 4.8  AST 0 - 37 U/L 19  ALT 0 - 35 U/L 22  Total Protein 6.0 - 8.3 g/dL 7.4  Total Bilirubin  0.2 - 1.2 mg/dL 0.4  GFR >60.00 mL/min 73.06    Latest Reference Range & Units 06/07/21 16:24  WBC 4.0 - 10.5 K/uL 8.5  RBC 3.87 - 5.11 Mil/uL 4.56  Hemoglobin 12.0 - 15.0 g/dL 14.6  HCT 36.0 - 46.0 % 43.4  MCV 78.0 - 100.0 fl 95.1  MCHC 30.0 - 36.0 g/dL 33.8  RDW 11.5 - 15.5 % 13.0  Platelets 150.0 - 400.0 K/uL 329.0  Neutrophils 43.0 - 77.0 % 71.1  Lymphocytes 12.0 - 46.0 % 19.1  Monocytes Relative 3.0 - 12.0 % 7.4  Eosinophil 0.0 - 5.0 % 1.8  Basophil 0.0 - 3.0 % 0.6  NEUT# 1.4 - 7.7 K/uL 6.0  Lymphocyte # 0.7 - 4.0 K/uL 1.6  Monocyte # 0.1 - 1.0 K/uL 0.6  Eosinophils Absolute 0.0 - 0.7 K/uL 0.2  Basophils Absolute 0.0 - 0.1 K/uL 0.1      Latest Reference Range & Units 06/14/21 09:12  TRAB <=2.00 IU/L 2.57 (H)    Thyroid ultrasound 07/01/2021  Estimated total number of nodules >/= 1 cm: 2   Number of spongiform nodules >/=  2 cm not described below (TR1): 0   Number of mixed cystic and solid nodules >/= 1.5 cm not described below ([place]): 0   _________________________________________________________   Nodule labeled 1 in the right mid thyroid, 1.1 cm. Nodule has spongiform characteristics and does not meet criteria for surveillance.   Nodule labeled 2, right lower thyroid, 8 mm with spongiform characteristics and does not meet criteria for surveillance.   Nodule labeled 3, inferior left thyroid, 9 mm. Nodule has TR 4 characteristics and does not meet criteria for surveillance.   Nodule labeled 4, superior left thyroid, 1.5 cm, with TR 2 characteristics and does not meet criteria for surveillance.   Nodule labeled 5, mid left thyroid, 9 mm. Nodule has TR 2 characteristics and does not meet criteria for surveillance   No adenopathy.   Recommendations follow those established by the new ACR TI-RADS criteria (J Am Coll Radiol 5956;38:756-433).   IMPRESSION: Heterogeneous multinodular thyroid, potentially representing medical thyroid disease, as above.     ASSESSMENT/PLAN/RECOMMENDATIONS:   Hyperthyroidism:  -Patient with hyperthyroid symptoms -No local neck symptoms -We discussed differential diagnosis of Graves' disease but also autonomous thyroid nodule, we will proceed with ultrasound of the thyroid -We discussed that Graves' Disease is a result of an autoimmune condition involving the thyroid.    We discussed with pt the benefits of methimazole in the Tx of hyperthyroidism, as well as the possible side effects/complications of anti-thyroid drug Tx (specifically detailing the rare, but serious side effect of agranulocytosis). She was informed of need for regular thyroid function monitoring while on methimazole to ensure appropriate dosage without over-treatment. As well, we discussed the possible side effects of methimazole including the chance of rash, the small chance of liver irritation/juandice and the <=1 in 300-400 chance of sudden onset agranulocytosis.  We discussed importance of going to ED promptly (and stopping methimazole) if shewere to develop significant fever with severe sore throat of other evidence of acute infection.      Medications : Start methimazole 5 mg daily  2.  Graves' disease:  -Slight elevation in TRAb -No extrathyroidal manifestation of Graves' disease -Patient advised to let her ophthalmologist know about this diagnosis    3. MNG:  No local neck symptoms  Non of the nodules met criteria for surveillance    Follow-up in 3 months Labs in 3 weeks  Signed electronically by: Mack Guise, MD  [facility] End[facility]  [address] [place], [place] [address] Phone: [phone] FAX: [phone]   CC: Marin Olp, [place] [address] Phone: [phone] Fax: [phone]   Return to Endocrinology clinic as below: Future Appointments  Date Time Provider [place]  11/09/2021  7:30 AM [place] Bentson, Melanie Crazier, MD [facility] None  11/11/2021  2:45 PM GI[facility]315 DG C-ARM RM 3 GI-315DG [facility] W. WE  11/11/2021  3:20 PM GI-315 MR 1 GI-315MRI [facility] W. WE  04/07/2022  8:20 AM Yong Channel, Brayton Mars, MD [facility]

## 2021-11-10 DIAGNOSIS — E0590 Thyrotoxicosis, unspecified without thyrotoxic crisis or storm: Secondary | ICD-10-CM | POA: Insufficient documentation

## 2021-11-10 DIAGNOSIS — R7989 Other specified abnormal findings of blood chemistry: Secondary | ICD-10-CM | POA: Insufficient documentation

## 2021-11-10 DIAGNOSIS — E049 Nontoxic goiter, unspecified: Secondary | ICD-10-CM | POA: Insufficient documentation

## 2021-11-10 LAB — T3: T3, Total: 158 ng/dL (ref 76–181)

## 2021-11-11 ENCOUNTER — Ambulatory Visit
Admission: RE | Admit: 2021-11-11 | Discharge: 2021-11-11 | Disposition: A | Discharge status: Home / Self Care | Payer: Commercial Managed Care - PPO | Source: Ambulatory Visit | Attending: Sports Medicine | Admitting: Sports Medicine

## 2021-11-11 DIAGNOSIS — M25511 Pain in right shoulder: Secondary | ICD-10-CM

## 2021-11-11 MED ORDER — IOPAMIDOL (ISOVUE-M 200) INJECTION 41%: 12.0000 mL | Freq: Once | INTRAMUSCULAR | Status: DC

## 2021-11-15 ENCOUNTER — Other Ambulatory Visit: Payer: Self-pay

## 2021-11-15 DIAGNOSIS — R7989 Other specified abnormal findings of blood chemistry: Secondary | ICD-10-CM

## 2021-11-24 ENCOUNTER — Telehealth: Payer: Self-pay | Admitting: Family Medicine

## 2021-11-24 NOTE — Telephone Encounter (Signed)
FYI--In regards to PCP request for mammogram records from [facility]   Type of form faxed: Medical Record Request for Continuity of Care  Additional comments: Requested Mammogram results  Received by: [facility] via fax at [phone]

## 2021-11-29 ENCOUNTER — Encounter: Payer: Self-pay | Admitting: Family Medicine

## 2021-11-29 ENCOUNTER — Ambulatory Visit (INDEPENDENT_AMBULATORY_CARE_PROVIDER_SITE_OTHER): Payer: Commercial Managed Care - PPO | Admitting: Family Medicine

## 2021-11-29 VITALS — BP 110/74 | HR 72 | Temp 98.4°F | Ht 71.0 in | Wt 233.4 lb

## 2021-11-29 DIAGNOSIS — I10 Essential (primary) hypertension: Secondary | ICD-10-CM

## 2021-11-29 DIAGNOSIS — R7989 Other specified abnormal findings of blood chemistry: Secondary | ICD-10-CM

## 2021-11-29 DIAGNOSIS — E0500 Thyrotoxicosis with diffuse goiter without thyrotoxic crisis or storm: Secondary | ICD-10-CM

## 2021-11-29 NOTE — Progress Notes (Signed)
Phone [phone] In person visit   Subjective:   Nicole Campos is a 61 y.o. year old very pleasant female patient who presents for/with See problem oriented charting Chief Complaint  Patient presents with   Follow-up    Pt would like to discuss gluten intolerance and be tested due to food not digesting properly and the healthier she eats the worse it is.   Hypertension    Pt states her bp runs high and has been on metoprolol for years and since being treated for her thyroid issues her bp has been running low with diastolic being as low as 47, also light headedness at times when standing. Has been taking half a tab she felt better with half a tab.   Elevated Hepatic Enzymes    Pt wants to discuss intent of elevated enzymes   Past Medical History-  Patient Active Problem List   Diagnosis Date Noted   Graves disease 07/21/2021    Priority: High   Hyperlipidemia 07/19/2017    Priority: Medium    Hyperglycemia 07/19/2017    Priority: Medium    Palpitations 05/28/2017    Priority: Medium    Vertigo 05/28/2017    Priority: Medium    Hypertension 05/02/2016    Priority: Medium    Diffuse pain 05/02/2016    Priority: Medium    Arthritis     Priority: Medium    Neuropathy 09/25/2018    Priority: Low   Adhesive capsulitis of left shoulder 01/30/2017    Priority: Low   Family history of colon cancer 05/02/2016    Priority: Low   Microscopic hematuria 10/16/2011    Priority: Low   Hyperthyroidism 11/10/2021   Elevated LFTs 11/10/2021   Goiter 11/10/2021    Medications- reviewed and updated Current Outpatient Medications  Medication Sig Dispense Refill   diazepam (VALIUM) 5 MG tablet Take 1 tablet (5 mg total) by mouth every 12 (twelve) hours as needed for anxiety (do not drive for 12 hours after taking). 11 tablet 0   gabapentin (NEURONTIN) 300 MG capsule Take 300 mg by mouth 3 (three) times daily.     metoprolol succinate (TOPROL-XL) 50 MG 24 hr tablet TAKE 1 TABLET  BY MOUTH EVERY DAY WITH OR IMMEDIATELY FOLLOWING A MEAL 91 tablet 3   PENNSAID 2 % SOLN SMARTSIG:2 Pump Topical Twice Daily     tiZANidine (ZANAFLEX) 4 MG tablet Take 4 mg by mouth 2 (two) times daily.     Vitamin D, Cholecalciferol, 25 MCG (1000 UT) CAPS Take 1 capsule by mouth daily in the afternoon.     diclofenac (VOLTAREN) 75 MG EC tablet Take 75 mg by mouth 2 (two) times daily.     No current facility-administered medications for this visit.     Objective:  BP 110/74   Pulse 72   Temp 98.4 F (36.9 C)   Ht '5\' 11"'  (1.803 m)   Wt 233 lb 6.4 oz (105.9 kg)   SpO2 96%   BMI 32.55 kg/m  Gen: NAD, resting comfortably CV: RRR no murmurs rubs or gallops Lungs: CTAB no crackles, wheeze, rhonchi Ext: no edema Skin: warm, dry    Assessment and Plan   #Concern for gluten intolerance S: Patient is interested in testing for celiac disease-does not feel like food digest properly-feels like when she eats healthier has increasingly worsening issues.  She also would like to be tested for gluten intolerance and dairy intolerance-stomach upset and cramping with milk intake A/P: I told patient I  thought she was likely lactose intolerant-recommended using lactase supplements or avoiding milk products - Since she already has an upcoming visit with gastroenterology I thought would be reasonable to discuss celiac testing-I have seen and ordered different labs that I traditionally have in the past and would be open to their opinion-in addition we are not planning on labs today-instead waiting on repeat LFT elevations (she also wants to discuss LFT elevations with them   #Graves' disease S: Presented with palpitations-seen by endocrinology Dr. Kelton Pillar and started on methimazole 5 mg to be titrated as needed- they have decided to stay off this for now with LFT elevations A/P:Hopefully thyroid test will remain stable-patient not interested in radioactive iodine at present and methimazole could raise  LFTs-if she cuts alcohol/loses weight-wonder if methimazole low-dose could be retried if LFTs normalize  #hypertension S: medication: metoprolol 50 mg XR-was recently only taking half tablet Home readings #s: Has noted some lower home heart rate readings as low as 47  (this was after a fire alarm/smoke detectors went off on vacation)- she was surprised it was this low when she felt anxious. Felt numb and like floating but was also very anxious  Had a few times feeling lightheaded at times with standing  before reducing to half tablet. In past had tendency to run higher prior to thyroid treatment  BP Readings from Last 3 Encounters:  11/29/21 110/74  11/09/21 110/70  10/05/21 118/70    A/P: Well-controlled-seems to be getting some bradycardia and associated lightheadedness-I agree with reducing metoprolol to 25 mg extended release -Due to thyroid issues mentioned potentially trying atenolol which she declines for now-strongly prefers to remain on metoprolol if she can  # Elevated transaminases S: Patient with elevated LFTs recently.  Had ultrasound of her right upper quadrant of abdomen which showed "Diffuse increased echogenicity of the hepatic parenchyma is a nonspecific indicator of hepatocellular dysfunction, most commonly steatosis." -weekends total 4 glasses of wine - 2 on Saturday or Sunday- or Friday  -even when only drinking 1 doesn't go to 0 . Was cautious on vacation. Ate twizzlers in hot tub for stress relief    Latest Ref Rng & Units 11/09/2021    8:07 AM 10/20/2021    8:36 AM 10/05/2021    8:40 AM  Hepatic Function  Total Protein 6.0 - 8.3 g/dL 7.1  6.5  6.8   Albumin 3.5 - 5.2 g/dL 4.3  4.4  4.4   AST 0 - 37 U/L 81  72  106   ALT 0 - 35 U/L 156  161  226   Alk Phosphatase 39 - 117 U/L 67  69  70   Total Bilirubin 0.2 - 1.2 mg/dL 0.7  0.9  0.7   A/P: Suspect part of this is fatty liver compounded by the methimazole-recommended max alcohol 1/day and less would be even  better.  Already scheduled for repeat LFTs.  Continue to work on healthy eating/regular exercise -Depending on trend we discussed may need further LFT elevation evaluation labs  #Stress management-patient's husband's work has 12 free mental health sessions  -did 2 sessions and found helpful-I congratulated her on seeking this out and moving forward with sessions-I hope she will find some very helpful tools   #MRI shoulder -surgery referral upcoming from Dr. Paulla Fore  #some foot discomfort- may want to call podiatry back- didn't go in 2021   Recommended follow up: Return for next already scheduled visit or sooner if needed. Future Appointments  Date Time Provider  [place]  12/29/2021  8:30 AM [facility]  02/01/2022  7:30 AM Shamleffer, Melanie Crazier, MD [facility] None  04/07/2022  8:20 AM Yong Channel, Brayton Mars, MD [facility]    Lab/Order associations:   ICD-10-CM   1. Graves disease  E05.00     2. Primary hypertension  I10     3. Elevated LFTs  R79.89      Time Spent: 41 minutes of total time (4:32- 5:08 PM, 6:04 PM-6:09 PM) was spent on the date of the encounter performing the following actions: chart review prior to seeing the patient, obtaining history, performing a medically necessary exam, counseling on the treatment plan and comforting some patient fears around LFT elevations including explaining these and potential next steps, placing orders, and documenting in our EHR.   Return precautions advised.  Garret Reddish, MD

## 2021-11-29 NOTE — Patient Instructions (Addendum)
Let us know when you get your flu and covid vaccines.  Ok to reduce metoprolol to half tablet  Thrilled you are seeing therapist- great job!   Recommended follow up: Return for next already scheduled visit or sooner if needed.

## 2021-12-01 NOTE — Telephone Encounter (Signed)
Records received and abstracted.

## 2021-12-07 ENCOUNTER — Other Ambulatory Visit: Payer: Self-pay | Admitting: Sports Medicine

## 2021-12-29 ENCOUNTER — Other Ambulatory Visit: Payer: Self-pay | Admitting: Family Medicine

## 2021-12-29 ENCOUNTER — Other Ambulatory Visit (INDEPENDENT_AMBULATORY_CARE_PROVIDER_SITE_OTHER): Payer: Commercial Managed Care - PPO

## 2021-12-29 DIAGNOSIS — R7989 Other specified abnormal findings of blood chemistry: Secondary | ICD-10-CM

## 2021-12-29 DIAGNOSIS — R7401 Elevation of levels of liver transaminase levels: Secondary | ICD-10-CM

## 2021-12-29 LAB — COMPREHENSIVE METABOLIC PANEL
ALT: 130 U/L — ABNORMAL HIGH (ref 0–35)
AST: 65 U/L — ABNORMAL HIGH (ref 0–37)
Albumin: 4.3 g/dL (ref 3.5–5.2)
Alkaline Phosphatase: 67 U/L (ref 39–117)
BUN: 16 mg/dL (ref 6–23)
CO2: 30 mEq/L (ref 19–32)
Calcium: 9.6 mg/dL (ref 8.4–10.5)
Chloride: 106 mEq/L (ref 96–112)
Creatinine, Ser: 0.74 mg/dL (ref 0.40–1.20)
GFR: 85.95 mL/min (ref >60.00)
Glucose, Bld: 88 mg/dL (ref 70–99)
Potassium: 4.1 mEq/L (ref 3.5–5.1)
Sodium: 141 mEq/L (ref 135–145)
Total Bilirubin: 0.7 mg/dL (ref 0.2–1.2)
Total Protein: 6.4 g/dL (ref 6.0–8.3)

## 2022-01-02 ENCOUNTER — Other Ambulatory Visit: Payer: Self-pay | Admitting: Sports Medicine

## 2022-01-03 ENCOUNTER — Other Ambulatory Visit (INDEPENDENT_AMBULATORY_CARE_PROVIDER_SITE_OTHER): Payer: Commercial Managed Care - PPO

## 2022-01-03 DIAGNOSIS — R7401 Elevation of levels of liver transaminase levels: Secondary | ICD-10-CM

## 2022-01-03 LAB — IBC + FERRITIN
Ferritin: 252.3 ng/mL (ref 10.0–291.0)
Iron: 142 ug/dL (ref 42–145)
Saturation Ratios: 42.4 % (ref 20.0–50.0)
TIBC: 334.6 ug/dL (ref 250.0–450.0)
Transferrin: 239 mg/dL (ref 212.0–360.0)

## 2022-01-04 ENCOUNTER — Other Ambulatory Visit: Payer: Self-pay | Admitting: Gastroenterology

## 2022-01-04 DIAGNOSIS — R748 Abnormal levels of other serum enzymes: Secondary | ICD-10-CM

## 2022-01-04 DIAGNOSIS — R109 Unspecified abdominal pain: Secondary | ICD-10-CM

## 2022-01-04 LAB — HEPATITIS B CORE ANTIBODY, TOTAL: Hep B Core Total Ab: NONREACTIVE

## 2022-01-04 LAB — HEPATITIS A ANTIBODY, IGM: Hep A IgM: NONREACTIVE

## 2022-01-04 LAB — HEPATITIS B SURFACE ANTIGEN: Hepatitis B Surface Ag: NONREACTIVE

## 2022-01-04 LAB — HEPATITIS B SURFACE ANTIBODY,QUALITATIVE: Hep B S Ab: NONREACTIVE

## 2022-01-04 LAB — ANA: Anti Nuclear Antibody (ANA): NEGATIVE

## 2022-01-10 ENCOUNTER — Telehealth: Payer: Self-pay | Admitting: Family Medicine

## 2022-01-10 NOTE — Telephone Encounter (Signed)
Patient requests to be called at ph# [phone] to be given remaining lab results. Patient states she has not heard anything since 01/05/22.

## 2022-01-10 NOTE — Telephone Encounter (Signed)
You did not respond to me on the following yet...  Wednesday January 11, 2022 by Marin Olp, MD. Message Received: 2 days ago Nicole Channel Brayton Mars, MD  [facility]; Nicole Channel Brayton Mars, MD Team can you update me on the status of this?   Have seen several of hep c not result lately- is my order ok?

## 2022-01-10 NOTE — Telephone Encounter (Signed)
Has the pending lab you were waiting on returned?

## 2022-01-11 ENCOUNTER — Encounter: Payer: Self-pay | Admitting: Family Medicine

## 2022-01-11 DIAGNOSIS — R7989 Other specified abnormal findings of blood chemistry: Secondary | ICD-10-CM

## 2022-01-11 NOTE — Telephone Encounter (Signed)
Nicole Campos was not in for me to check on this, I will see what I can find out today.

## 2022-01-11 NOTE — Telephone Encounter (Signed)
I have followed up with pt via mychart and made aware that we are looking into this matter and I will f/u with her once we have an answer.

## 2022-01-12 ENCOUNTER — Other Ambulatory Visit: Payer: Commercial Managed Care - PPO

## 2022-01-12 DIAGNOSIS — R7989 Other specified abnormal findings of blood chemistry: Secondary | ICD-10-CM

## 2022-01-31 LAB — HEMOCHROMATOSIS DNA-PCR(C282Y,H63D)

## 2022-01-31 LAB — PROTEIN ELECTROPHORESIS, SERUM
Albumin ELP: 4.4 g/dL (ref 3.8–4.8)
Alpha 1: 0.3 g/dL (ref 0.2–0.3)
Alpha 2: 0.5 g/dL (ref 0.5–0.9)
Beta 2: 0.3 g/dL (ref 0.2–0.5)
Beta Globulin: 0.4 g/dL (ref 0.4–0.6)
Gamma Globulin: 0.8 g/dL (ref 0.8–1.7)
Total Protein: 6.7 g/dL (ref 6.1–8.1)

## 2022-01-31 LAB — HEPATITIS C ANTIBODY: Hepatitis C Ab: NONREACTIVE

## 2022-02-01 ENCOUNTER — Encounter: Payer: Self-pay | Admitting: Internal Medicine

## 2022-02-01 ENCOUNTER — Ambulatory Visit: Payer: Commercial Managed Care - PPO | Admitting: Internal Medicine

## 2022-02-01 VITALS — BP 124/70 | HR 70 | Ht 71.0 in | Wt 232.0 lb

## 2022-02-01 DIAGNOSIS — E042 Nontoxic multinodular goiter: Secondary | ICD-10-CM

## 2022-02-01 DIAGNOSIS — E0500 Thyrotoxicosis with diffuse goiter without thyrotoxic crisis or storm: Secondary | ICD-10-CM

## 2022-02-01 DIAGNOSIS — E0590 Thyrotoxicosis, unspecified without thyrotoxic crisis or storm: Secondary | ICD-10-CM | POA: Diagnosis not present

## 2022-02-01 LAB — TSH: TSH: 0.45 u[IU]/mL (ref 0.35–5.50)

## 2022-02-01 LAB — T4, FREE: Free T4: 1 ng/dL (ref 0.60–1.60)

## 2022-02-01 NOTE — Progress Notes (Signed)
Name: Nicole Campos  MRN/ DOB: [id number], Nov 12, 1960    Age/ Sex: 61 y.o., female    PCP: Marin Olp, MD   Reason for Endocrinology Evaluation: Hyperthyroid     Date of Initial Endocrinology Evaluation: 07/01/2021    HPI: Nicole Campos is a 61 y.o. female with a past medical history of HTN, neuropathy. The patient presented for initial endocrinology clinic visit on 07/01/2021 for consultative assistance with her hyperthyroid.   Patient has been diagnosed with hyperthyroidism in March 2023 when she presented to her PCP with tachycardia and palpitations.  She was also noted to have elevated TRAb at 2.45 IU/L  Thyroid ultrasound on 07/01/2021 showed multiple thyroid nodules, none met FNA criteria  No Fh of thyroid disease   She was started on methimazole 5 mg daily, by October 05, 2021 her AST was elevated from 19 to 106 U/L and ALT increased from 22 to 226 U/L with normal alkaline phosphatase at 70.   Abdominal ultrasound revealed a picture consistent with hepatic steatosis  LFTs improved with discontinuation of methimazole but remained elevated   SUBJECTIVE:    Today (02/01/22): Nicole Campos is here for a follow up on hyperthyroidism and Graves' disease.   Has been off methimazole since July,2023   She is S/P right shoulder sx 01/18/2022 Has not been sleeping well as she is sleeping on the couch  Saw GI, pending abdominal scans Has occasional palpitations  Denies local neck swelling  Denies tremors except rarely  Has constipation constipation but rare  loose stools  She has noted cold intolerance      HISTORY:  Past Medical History:  Past Medical History:  Diagnosis Date   Arthritis    cervical, lumbar, knees, feet. mild hands   Headache(784.0)    Hx: of occasinal Migraine   Heart palpitations    Hx: of   Hypertension    PONV (postoperative nausea and vomiting)    Past Surgical History:  Past Surgical History:  Procedure Laterality Date    [place] N/A 02/28/2013   Procedure: LAPAROSCOPIC CHOLECYSTECTOMY;  Surgeon: Ralene Ok, MD;  Location: [facility];  Service: General;  Laterality: N/A;   COLONOSCOPY W/ POLYPECTOMY     Hx: of   KNEE SURGERY     Left knee - meniscus tear   LAPAROSCOPIC APPENDECTOMY N/A 08/05/2012   Procedure: APPENDECTOMY LAPAROSCOPIC;  Surgeon: Joyice Faster. Cornett, MD;  Location: [facility];  Service: General;  Laterality: N/A;   wisdom teeth      Social History:  reports that she has never smoked. She has never used smokeless tobacco. She reports current alcohol use of about 3.0 standard drinks of alcohol per week. She reports that she does not use drugs. Family History: family history includes Cancer - Colon in her brother; Heart attack in her paternal grandfather and paternal grandmother; Hypertension in her mother; Lung cancer in her maternal grandfather; Pancreatic cancer in her father; Stroke in her maternal grandmother; Uterine cancer in her mother.   HOME MEDICATIONS: Allergies as of 02/01/2022       Reactions   Penicillins Itching   Pt states this may be an error and that she "was given a derivative" of PCN with no reactions at all.        Medication List        Accurate as of February 01, 2022 10:17 AM. If you have any questions, ask your  nurse or doctor.          Acetaminophen Extra Strength 500 MG Caps Take 2 capsules by mouth every 8 (eight) hours.   Ambien 5 MG tablet Generic drug: zolpidem Take 5 mg by mouth at bedtime.   diazepam 5 MG tablet Commonly known as: VALIUM Take 1 tablet (5 mg total) by mouth every 12 (twelve) hours as needed for anxiety (do not drive for 12 hours after taking).   diclofenac 75 MG EC tablet Commonly known as: VOLTAREN Take 75 mg by mouth 2 (two) times daily.   gabapentin 300 MG capsule Commonly known as: NEURONTIN Take 300 mg by mouth 3 (three) times daily.   metoprolol succinate 50 MG 24 hr  tablet Commonly known as: TOPROL-XL TAKE 1 TABLET BY MOUTH EVERY DAY WITH OR IMMEDIATELY FOLLOWING A MEAL   Pennsaid 2 % Soln Generic drug: diclofenac Sodium SMARTSIG:2 Pump Topical Twice Daily   tiZANidine 4 MG tablet Commonly known as: ZANAFLEX Take 4 mg by mouth 2 (two) times daily.   Vitamin D (Cholecalciferol) 25 MCG (1000 UT) Caps Take 1 capsule by mouth daily in the afternoon.          REVIEW OF SYSTEMS: A comprehensive ROS was conducted with the patient and is negative except as per HPI    OBJECTIVE:  VS: BP 124/70 (BP Location: Left Arm, Patient Position: Sitting, Cuff Size: Large)   Pulse 70   Ht _0  (1.803 m)   Wt 232 lb (105.2 kg)   SpO2 99%   BMI 32.36 kg/m    Wt Readings from Last 3 Encounters:  02/01/22 232 lb (105.2 kg)  11/29/21 233 lb 6.4 oz (105.9 kg)  11/09/21 231 lb (104.8 kg)     EXAM: General: Pt appears well and is in NAD  Eyes: External eye exam normal without stare, lid lag or exophthalmos.  EOM intact.    Neck: General: Supple without adenopathy. Thyroid: Thyroid size normal.  No goiter or nodules appreciated. .  Lungs: Clear with good BS bilat with no rales, rhonchi, or wheezes  Heart: Auscultation: RRR.  Abdomen: Normoactive bowel sounds, soft, nontender, without masses or organomegaly palpable  Extremities:  BL LE: No pretibial edema normal ROM and strength.  Mental Status: Judgment, insight: Intact Orientation: Oriented to time, place, and person Memory: Intact for recent and remote events Mood and affect: No depression, anxiety, or agitation     DATA REVIEWED:   Latest Reference Range & Units 02/01/22 07:45  TSH 0.35 - 5.50 uIU/mL 0.45  T4,Free(Direct) 0.60 - 1.60 ng/dL 1.00       Latest Reference Range & Units 11/09/21 08:07  Sodium 135 - 145 mEq/L 143  Potassium 3.5 - 5.1 mEq/L 5.0  Chloride 96 - 112 mEq/L 107  CO2 19 - 32 mEq/L 29  Glucose 70 - 99 mg/dL 98  BUN 6 - 23 mg/dL 18  Creatinine 0.40 - 1.20 mg/dL  0.80  Calcium 8.4 - 10.5 mg/dL 9.8  Alkaline Phosphatase 39 - 117 U/L 67  Albumin 3.5 - 5.2 g/dL 4.3  AST 0 - 37 U/L 81 (H)  ALT 0 - 35 U/L 156 (H)  Total Protein 6.0 - 8.3 g/dL 7.1  Total Bilirubin 0.2 - 1.2 mg/dL 0.7  GFR >60.00 mL/min 78.34    Latest Reference Range & Units 11/09/21 08:07  WBC 4.0 - 10.5 K/uL 4.3  RBC 3.87 - 5.11 Mil/uL 4.36  Hemoglobin 12.0 - 15.0 g/dL 14.2  HCT 36.0 - 46.0 %  42.4  MCV 78.0 - 100.0 fl 97.3  MCHC 30.0 - 36.0 g/dL 33.5  RDW 11.5 - 15.5 % 13.0  Platelets 150.0 - 400.0 K/uL 244.0  Glucose 70 - 99 mg/dL 98  TSH 0.35 - 5.50 uIU/mL 0.22 (L)  Triiodothyronine (T3) 76 - 181 ng/dL 158  T4,Free(Direct) 0.60 - 1.60 ng/dL 1.09       Latest Reference Range & Units 06/14/21 09:12  TRAB <=2.00 IU/L 2.57 (H)    Thyroid ultrasound 07/01/2021  Estimated total number of nodules >/= 1 cm: 2   Number of spongiform nodules >/=  2 cm not described below (TR1): 0   Number of mixed cystic and solid nodules >/= 1.5 cm not described below (TR2): 0   _________________________________________________________   Nodule labeled 1 in the right mid thyroid, 1.1 cm. Nodule has spongiform characteristics and does not meet criteria for surveillance.   Nodule labeled 2, right lower thyroid, 8 mm with spongiform characteristics and does not meet criteria for surveillance.   Nodule labeled 3, inferior left thyroid, 9 mm. Nodule has TR 4 characteristics and does not meet criteria for surveillance.   Nodule labeled 4, superior left thyroid, 1.5 cm, with TR 2 characteristics and does not meet criteria for surveillance.   Nodule labeled 5, mid left thyroid, 9 mm. Nodule has TR 2 characteristics and does not meet criteria for surveillance   No adenopathy.   Recommendations follow those established by the new ACR TI-RADS criteria (J Am Coll Radiol 5521;74:715-953).   IMPRESSION: Heterogeneous multinodular thyroid, potentially representing medical thyroid  disease, as above.    ASSESSMENT/PLAN/RECOMMENDATIONS:   Hyperthyroidism:  -D/D includes Graves' disease and possible autonomous thyroid nodule -Thyroid ultrasound in April 2023 confirmed multinodular goiter -TRAb slightly elevated -She was on methimazole 5 mg daily when she developed elevated AST and ALT, she does have an underlying hepatic steatosis.  Despite discontinuing methimazole her LFTs remained elevated - We  discussed the various treatment options for hyperthyroidism and Graves disease including ablation therapy with radioactive iodine versus antithyroid drug treatment versus surgical therapy, should she become overtly hyperthyroid.  - I carefully explained to the patient that one of the consequences of I-131 ablation treatment would likely be permanent hypothyroidism which would require long-term replacement therapy with LT4. -Repeat TFTs show normalization    Medications : N/A    2.  Graves' disease:  -Slight elevation in TRAb -No extrathyroidal manifestation of Graves' disease   3. MNG:  -No local neck symptoms  -None of the nodules met criteria for surveillance      Follow-up in 4 months   Signed electronically by: Mack Guise, MD  [facility] Endocrin[facility]  [address] [place],  [address] Phone: [phone] FAX: [phone]   CC: Marin Olp, [place] [address] Phone: [phone] Fax: [phone]   Return to Endocrinology clinic as below: Future Appointments  Date Time Provider [place]  02/13/2022  3:00 PM GI-315 CT 1 GI-315CT [facility] [facility]  04/07/2022  8:20 AM Marin Olp, MD [facility]  06/08/2022  9:10 AM Kevontay Burks, Melanie Crazier, MD [facility] None

## 2022-02-06 ENCOUNTER — Other Ambulatory Visit: Payer: Self-pay

## 2022-02-06 ENCOUNTER — Other Ambulatory Visit: Payer: Self-pay | Admitting: Sports Medicine

## 2022-02-06 DIAGNOSIS — R7989 Other specified abnormal findings of blood chemistry: Secondary | ICD-10-CM

## 2022-02-07 LAB — T3: T3, Total: 133 ng/dL (ref 76–181)

## 2022-02-13 ENCOUNTER — Ambulatory Visit
Admission: RE | Admit: 2022-02-13 | Discharge: 2022-02-13 | Disposition: A | Discharge status: Home / Self Care | Payer: Commercial Managed Care - PPO | Source: Ambulatory Visit | Attending: Gastroenterology | Admitting: Gastroenterology

## 2022-02-13 DIAGNOSIS — R109 Unspecified abdominal pain: Secondary | ICD-10-CM

## 2022-02-13 DIAGNOSIS — R748 Abnormal levels of other serum enzymes: Secondary | ICD-10-CM

## 2022-02-13 MED ORDER — IOPAMIDOL (ISOVUE-300) INJECTION 61%
100.0000 mL | Freq: Once | INTRAVENOUS | Status: AC | PRN
  Administered 2022-02-13: 100 mL via INTRAVENOUS

## 2022-03-02 ENCOUNTER — Other Ambulatory Visit (INDEPENDENT_AMBULATORY_CARE_PROVIDER_SITE_OTHER): Payer: Commercial Managed Care - PPO

## 2022-03-02 DIAGNOSIS — R7989 Other specified abnormal findings of blood chemistry: Secondary | ICD-10-CM | POA: Diagnosis not present

## 2022-03-02 LAB — COMPREHENSIVE METABOLIC PANEL
ALT: 45 U/L — ABNORMAL HIGH (ref 0–35)
AST: 20 U/L (ref 0–37)
Albumin: 4.4 g/dL (ref 3.5–5.2)
Alkaline Phosphatase: 73 U/L (ref 39–117)
BUN: 23 mg/dL (ref 6–23)
CO2: 29 mEq/L (ref 19–32)
Calcium: 10.2 mg/dL (ref 8.4–10.5)
Chloride: 105 mEq/L (ref 96–112)
Creatinine, Ser: 0.73 mg/dL (ref 0.40–1.20)
GFR: 87.25 mL/min (ref >60.00)
Glucose, Bld: 59 mg/dL — ABNORMAL LOW (ref 70–99)
Potassium: 3.8 mEq/L (ref 3.5–5.1)
Sodium: 142 mEq/L (ref 135–145)
Total Bilirubin: 0.7 mg/dL (ref 0.2–1.2)
Total Protein: 6.7 g/dL (ref 6.0–8.3)

## 2022-04-05 LAB — HM PAP SMEAR: HPV 16/18/45 genotyping: NEGATIVE

## 2022-04-07 ENCOUNTER — Ambulatory Visit: Payer: Commercial Managed Care - PPO | Admitting: Family Medicine

## 2022-04-07 ENCOUNTER — Encounter: Payer: Self-pay | Admitting: Family Medicine

## 2022-04-07 VITALS — BP 118/70 | HR 76 | Temp 97.2°F | Ht 71.0 in | Wt 230.0 lb

## 2022-04-07 DIAGNOSIS — E0500 Thyrotoxicosis with diffuse goiter without thyrotoxic crisis or storm: Secondary | ICD-10-CM | POA: Diagnosis not present

## 2022-04-07 DIAGNOSIS — I10 Essential (primary) hypertension: Secondary | ICD-10-CM

## 2022-04-07 DIAGNOSIS — R7989 Other specified abnormal findings of blood chemistry: Secondary | ICD-10-CM

## 2022-04-07 LAB — COMPREHENSIVE METABOLIC PANEL
ALT: 68 U/L — ABNORMAL HIGH (ref 0–35)
AST: 36 U/L (ref 0–37)
Albumin: 4.1 g/dL (ref 3.5–5.2)
Alkaline Phosphatase: 64 U/L (ref 39–117)
BUN: 17 mg/dL (ref 6–23)
CO2: 30 mEq/L (ref 19–32)
Calcium: 9.9 mg/dL (ref 8.4–10.5)
Chloride: 106 mEq/L (ref 96–112)
Creatinine, Ser: 0.66 mg/dL (ref 0.40–1.20)
GFR: 93.01 mL/min (ref >60.00)
Glucose, Bld: 77 mg/dL (ref 70–99)
Potassium: 4.8 mEq/L (ref 3.5–5.1)
Sodium: 142 mEq/L (ref 135–145)
Total Bilirubin: 0.8 mg/dL (ref 0.2–1.2)
Total Protein: 6.3 g/dL (ref 6.0–8.3)

## 2022-04-07 LAB — T3, FREE: T3, Free: 2.7 pg/mL (ref 2.3–4.2)

## 2022-04-07 LAB — TSH: TSH: 0.89 u[IU]/mL (ref 0.35–5.50)

## 2022-04-07 LAB — T4, FREE: Free T4: 0.85 ng/dL (ref 0.60–1.60)

## 2022-04-07 NOTE — Progress Notes (Signed)
Phone [phone] In person visit   Subjective:   Nicole Hegner is a 62 y.o. year old very pleasant female patient who presents for/with See problem oriented charting Chief Complaint  Patient presents with   Follow-up   Hypertension    Past Medical History-  Patient Active Problem List   Diagnosis Date Noted   Multinodular goiter 02/01/2022    Priority: High   Hyperthyroidism 11/10/2021    Priority: High   Elevated LFTs 11/10/2021    Priority: High   Goiter 11/10/2021    Priority: High   Graves disease 07/21/2021    Priority: High   Hyperlipidemia 07/19/2017    Priority: Medium    Hyperglycemia 07/19/2017    Priority: Medium    Palpitations 05/28/2017    Priority: Medium    Vertigo 05/28/2017    Priority: Medium    Hypertension 05/02/2016    Priority: Medium    Diffuse pain 05/02/2016    Priority: Medium    Arthritis     Priority: Medium    Neuropathy 09/25/2018    Priority: Low   Adhesive capsulitis of left shoulder 01/30/2017    Priority: Low   Family history of colon cancer 05/02/2016    Priority: Low   Microscopic hematuria 10/16/2011    Priority: Low    Medications- reviewed and updated Current Outpatient Medications  Medication Sig Dispense Refill   amitriptyline (ELAVIL) 25 MG tablet Take 1 tablet by mouth at bedtime as needed.     gabapentin (NEURONTIN) 300 MG capsule Take 300 mg by mouth 3 (three) times daily.     metoprolol succinate (TOPROL-XL) 50 MG 24 hr tablet TAKE 1 TABLET BY MOUTH EVERY DAY WITH OR IMMEDIATELY FOLLOWING A MEAL 91 tablet 3   tiZANidine (ZANAFLEX) 4 MG tablet Take 4 mg by mouth 2 (two) times daily.     Vitamin D, Cholecalciferol, 25 MCG (1000 UT) CAPS Take 1 capsule by mouth daily in the afternoon.     Acetaminophen Extra Strength 500 MG CAPS Take 2 capsules by mouth every 8 (eight) hours. (Patient not taking: Reported on 04/07/2022)     diazepam (VALIUM) 5 MG tablet Take 1 tablet (5 mg total) by mouth every 12 (twelve)  hours as needed for anxiety (do not drive for 12 hours after taking). (Patient not taking: Reported on 04/07/2022) 11 tablet 0   PENNSAID 2 % SOLN SMARTSIG:2 Pump Topical Twice Daily (Patient not taking: Reported on 04/07/2022)     No current facility-administered medications for this visit.     Objective:  BP 118/70   Pulse 76   Temp (!) 97.2 F (36.2 C)   Ht 5\' 11"  (1.803 m)   Wt 230 lb (104.3 kg)   SpO2 97%   BMI 32.08 kg/m  Gen: NAD, resting comfortably CV: RRR no murmurs rubs or gallops Lungs: CTAB no crackles, wheeze, rhonchi Abdomen: soft/nontender/nondistended.  Ext: no edema Skin: warm, dry     Assessment and Plan   #Right shoulder surgery- rotator cuff surgery, bicep tendinopathy, ac joint- has done well and working with PT -had follow up with Dr. Paulla Fore differently  # Concern for gluten intolerance-patient tested negative for celiac with Dr. Paulita Fujita in November- has follow up in february   # LFT elevations-thankfully patient with phenomenal improvement in LFTs in December.  CT of abdomen pelvis by Dr. Paulita Fujita on February 13, 2022 with the following findings "Hepatobiliary: No suspicious focal abnormality within the liver parenchyma. Gallbladder is surgically absent. No intrahepatic or  extrahepatic biliary dilation."  But fatty liver had been noted on prior ultrasound  -Elevated LFTs starting in July-weight largely stable since that time   #Graves' disease S: Presented with palpitations-seen by endocrinology Dr. Kelton Pillar and started on methimazole 5 mg to be titrated as needed.  Thankfully repeat liver function test showed normalization in November with plan for 70-month follow-up -Campos feels stress was a Financial [employer]- has been doing guided meditation- including ones for pain.  Overall stress is much better- states may amp up but thinks Campos has a new approach. Does plan to retire next year- has met with financial planner Lab Results  Component Value Date   TSH 0.45  02/01/2022  A/P:graves #s significantly improved- Campos requests update today- continue without medicine or other treatment -note prior LFT elevations possibly related to methimazole (remain off)   #hypertension S: medication: metoprolol 50 mg XR (still with some palpitations) BP Readings from Last 3 Encounters:  04/07/22 118/70  02/01/22 124/70  11/29/21 110/74   A/P: stable- continue current medicines   Recommended follow up: Return in about 6 months (around 10/06/2022) for physical or sooner if needed.Schedule b4 you leave. Future Appointments  Date Time Provider [place]  06/08/2022  9:10 AM Shamleffer, Melanie Crazier, MD [facility] None   Lab/Order associations:   ICD-10-CM   1. Elevated LFTs  R79.89 Comprehensive metabolic panel    2. Graves disease  E05.00 TSH    T4, free    T3, free    3. Primary hypertension  I10      Return precautions advised.  Garret Reddish, MD

## 2022-04-07 NOTE — Patient Instructions (Addendum)
Please stop by lab before you go If you have mychart- we will send your results within 3 business days of [place] receiving them.  If you do not have mychart- we will call you about results within 5 business days of [place] receiving them.  *please also note that you will see labs on mychart as soon as they post. I will later go in and write notes on them- will say "notes from Dr. Yong Channel"   Recommended follow up: Return in about 6 months (around 10/06/2022) for physical or sooner if needed.Schedule b4 you leave.

## 2022-06-08 ENCOUNTER — Ambulatory Visit: Payer: Commercial Managed Care - PPO | Admitting: Internal Medicine

## 2022-06-08 ENCOUNTER — Encounter: Payer: Self-pay | Admitting: Internal Medicine

## 2022-06-08 VITALS — BP 120/80 | HR 72 | Ht 71.0 in | Wt 232.0 lb

## 2022-06-08 DIAGNOSIS — E0500 Thyrotoxicosis with diffuse goiter without thyrotoxic crisis or storm: Secondary | ICD-10-CM | POA: Diagnosis not present

## 2022-06-08 DIAGNOSIS — E0590 Thyrotoxicosis, unspecified without thyrotoxic crisis or storm: Secondary | ICD-10-CM

## 2022-06-08 LAB — TSH: TSH: 1.02 u[IU]/mL (ref 0.35–5.50)

## 2022-06-08 LAB — T4, FREE: Free T4: 0.98 ng/dL (ref 0.60–1.60)

## 2022-06-08 NOTE — Progress Notes (Signed)
Name: Nicole Campos  MRN/ DOB: [id number], [birth year]    Age/ Sex: 62 y.o., female    PCP: Marin Olp, MD   Reason for Endocrinology Evaluation: Hyperthyroid     Date of Initial Endocrinology Evaluation: 07/01/2021    HPI: Nicole Campos is a 62 y.o. female with a past medical history of HTN, neuropathy. The patient presented for initial endocrinology clinic visit on 07/01/2021 for consultative assistance with her hyperthyroid.   Patient has been diagnosed with hyperthyroidism in March 2023 when she presented to her PCP with tachycardia and palpitations.  She was also noted to have elevated TRAb at 2.45 IU/L  Thyroid ultrasound on 07/01/2021 showed multiple thyroid nodules, none met FNA criteria  No Fh of thyroid disease   She was started on methimazole 5 mg daily, by October 05, 2021 her AST was elevated from 19 to 106 U/L and ALT increased from 22 to 226 U/L with normal alkaline phosphatase at 70.   Abdominal ultrasound revealed a picture consistent with hepatic steatosis  LFTs improved with discontinuation of methimazole in 09/2021  but remained elevated     TFTs normalized by 01/2022 SUBJECTIVE:    Today (06/08/22): Nicole Campos is here for a follow up on hyperthyroidism and Graves' disease.   Has been off methimazole since July,2023 She was seen by GI 05/11/2022   Weight stable but she is c/o weight gain   Has chronic palpitations , wore a heart monitor, on metoprolol Denies local neck swelling  Denies tremors Continues with constipation with decrease water intake  Continues with right shoulder pain since 01/2022 sx Has thinning of nails  Has been tired   She is on Elavil for shoulder pain and sleep issues   She is on MVI  She is on Selenium and vitamin D gummies    HISTORY:  Past Medical History:  Past Medical History:  Diagnosis Date   Arthritis    cervical, lumbar, knees, feet. mild hands   Headache(784.0)    Hx: of occasinal  Migraine   Heart palpitations    Hx: of   Hypertension    PONV (postoperative nausea and vomiting)    Past Surgical History:  Past Surgical History:  Procedure Laterality Date   [place] N/A 02/28/2013   Procedure: LAPAROSCOPIC CHOLECYSTECTOMY;  Surgeon: Ralene Ok, MD;  Location: [facility];  Service: General;  Laterality: N/A;   COLONOSCOPY W/ POLYPECTOMY     Hx: of   KNEE SURGERY     Left knee - meniscus tear   LAPAROSCOPIC APPENDECTOMY N/A 08/05/2012   Procedure: APPENDECTOMY LAPAROSCOPIC;  Surgeon: Joyice Faster. Cornett, MD;  Location: [facility];  Service: General;  Laterality: N/A;   wisdom teeth      Social History:  reports that she has never smoked. She has never used smokeless tobacco. She reports current alcohol use of about 3.0 standard drinks of alcohol per week. She reports that she does not use drugs. Family History: family history includes Cancer - Colon in her brother; Heart attack in her paternal grandfather and paternal grandmother; Hypertension in her mother; Lung cancer in her maternal grandfather; Pancreatic cancer in her father; Stroke in her maternal grandmother; Uterine cancer in her mother.   HOME MEDICATIONS: Allergies as of 06/08/2022       Reactions   Penicillins Itching   Pt states this may be an error and that she "was given a derivative"  of PCN with no reactions at all.        Medication List        Accurate as of June 08, 2022  2:48 PM. If you have any questions, ask your nurse or doctor.          Acetaminophen Extra Strength 500 MG Caps Take 2 capsules by mouth every 8 (eight) hours.   amitriptyline 25 MG tablet Commonly known as: ELAVIL Take 1 tablet by mouth at bedtime as needed.   diazepam 5 MG tablet Commonly known as: VALIUM Take 1 tablet (5 mg total) by mouth every 12 (twelve) hours as needed for anxiety (do not drive for 12 hours after taking).   gabapentin 300 MG  capsule Commonly known as: NEURONTIN Take 300 mg by mouth 2 (two) times daily.   MAGNESIUM GLUCONATE PO Take 2 tablets by mouth daily.   metoprolol succinate 50 MG 24 hr tablet Commonly known as: TOPROL-XL TAKE 1 TABLET BY MOUTH EVERY DAY WITH OR IMMEDIATELY FOLLOWING A MEAL   Pennsaid 2 % Soln Generic drug: diclofenac Sodium SMARTSIG:2 Pump Topical Twice Daily   tiZANidine 4 MG tablet Commonly known as: ZANAFLEX Take 4 mg by mouth 2 (two) times daily.   Vitamin D (Cholecalciferol) 25 MCG (1000 UT) Caps Take 1 capsule by mouth daily in the afternoon.          REVIEW OF SYSTEMS: A comprehensive ROS was conducted with the patient and is negative except as per HPI    OBJECTIVE:  VS: BP 120/80 (BP Location: Left Arm, Patient Position: Sitting, Cuff Size: Large)   Pulse 72   Ht 5\' 11"  (1.803 m)   Wt 232 lb (105.2 kg)   SpO2 95%   BMI 32.36 kg/m    Wt Readings from Last 3 Encounters:  06/08/22 232 lb (105.2 kg)  04/07/22 230 lb (104.3 kg)  02/01/22 232 lb (105.2 kg)     EXAM: General: Pt appears well and is in NAD  Eyes: External eye exam normal without stare  Neck: General: Supple without adenopathy. Thyroid: Thyroid size normal.  No goiter or nodules appreciated. .  Lungs: Clear with good BS bilat  Heart: Auscultation: RRR.  Abdomen: soft, nontender  Extremities:  BL LE: No pretibial edema      DATA REVIEWED:  Latest Reference Range & Units 06/08/22 09:31  TSH 0.35 - 5.50 uIU/mL 1.02  T4,Free(Direct) 0.60 - 1.60 ng/dL 0.98    Latest Reference Range & Units 04/07/22 09:37  Sodium 135 - 145 mEq/L 142  Potassium 3.5 - 5.1 mEq/L 4.8  Chloride 96 - 112 mEq/L 106  CO2 19 - 32 mEq/L 30  Glucose 70 - 99 mg/dL 77  BUN 6 - 23 mg/dL 17  Creatinine 0.40 - 1.20 mg/dL 0.66  Calcium 8.4 - 10.5 mg/dL 9.9  Alkaline Phosphatase 39 - 117 U/L 64  Albumin 3.5 - 5.2 g/dL 4.1  AST 0 - 37 U/L 36  ALT 0 - 35 U/L 68 (H)  Total Protein 6.0 - 8.3 g/dL 6.3  Total  Bilirubin 0.2 - 1.2 mg/dL 0.8  GFR >60.00 mL/min 93.01  TSH 0.35 - 5.50 uIU/mL 0.89  Triiodothyronine,Free,Serum 2.3 - 4.2 pg/mL 2.7  T4,Free(Direct) 0.60 - 1.60 ng/dL 0.85  (H): Data is abnormally high    Latest Reference Range & Units 06/14/21 09:12  TRAB <=2.00 IU/L 2.57 (H)    Thyroid ultrasound 07/01/2021  Estimated total number of nodules >/= 1 cm: 2   Number of spongiform  nodules >/=  2 cm not described below (TR1): 0   Number of mixed cystic and solid nodules >/= 1.5 cm not described below (TR2): 0   _________________________________________________________   Nodule labeled 1 in the right mid thyroid, 1.1 cm. Nodule has spongiform characteristics and does not meet criteria for surveillance.   Nodule labeled 2, right lower thyroid, 8 mm with spongiform characteristics and does not meet criteria for surveillance.   Nodule labeled 3, inferior left thyroid, 9 mm. Nodule has TR 4 characteristics and does not meet criteria for surveillance.   Nodule labeled 4, superior left thyroid, 1.5 cm, with TR 2 characteristics and does not meet criteria for surveillance.   Nodule labeled 5, mid left thyroid, 9 mm. Nodule has TR 2 characteristics and does not meet criteria for surveillance   No adenopathy.   Recommendations follow those established by the new ACR TI-RADS criteria (J Am Coll Radiol [id number]).   IMPRESSION: Heterogeneous multinodular thyroid, potentially representing medical thyroid disease, as above.    ASSESSMENT/PLAN/RECOMMENDATIONS:   Hyperthyroidism:  -Resolved -She has been off methimazole therapy since July, 2023 due to elevated LFTs -Repeat TFTs are normal today    2.  Graves' disease:  -Slight elevation in TRAb -No extrathyroidal manifestation of Graves' disease   3. MNG:  -No local neck symptoms  -None of the nodules met criteria for surveillance    4.  Weight gain:  -I did explain to the patient that this is most likely  related to amitriptyline, she is already working towards weaning off this -She is also on gabapentin which could cause similar symptoms with weight gain  Follow-up in 1 yr   I spent 25 minutes preparing to see the patient by review of recent labs, imaging and procedures, obtaining and reviewing separately obtained history, communicating with the patient/family or caregiver, ordering medications, tests or procedures, and documenting clinical information in the EHR including the differential Dx, treatment, and any further evaluation and other management   Signed electronically by: Mack Guise, MD  [facility] End[facility]in[facility]  [facility] [address] [place], [place] [address] Phone: [phone] FAX: [phone]   CC: Marin Olp, [place] [address] [place] [address] Phone: [phone] Fax: [phone]   Return to Endocrinology clinic as below: Future Appointments  Date Time Provider [place]  10/12/2022  1:00 PM Marin Olp, MD [facility]  06/08/2023  7:50 AM Treylan Mcclintock, Melanie Crazier, MD [facility] None

## 2022-06-10 LAB — TRAB (TSH RECEPTOR BINDING ANTIBODY): TRAB: 1.42 IU/L (ref <=2.00)

## 2022-06-10 LAB — T3: T3, Total: 128 ng/dL (ref 76–181)

## 2022-06-29 ENCOUNTER — Encounter: Payer: Self-pay | Admitting: Family Medicine

## 2022-07-20 IMAGING — US US THYROID
1 series · 13 of 25 positions shown · non-contrast
Comparison: None

CLINICAL DATA: 61-year-old female with hyperthyroidism

EXAM:
THYROID ULTRASOUND
TECHNIQUE: Ultrasound examination of the thyroid gland and adjacent soft
tissues was performed.

[Series 1: us thyroid · 0.05mm/px · 13 of 64 slices shown]
[im 1/64]
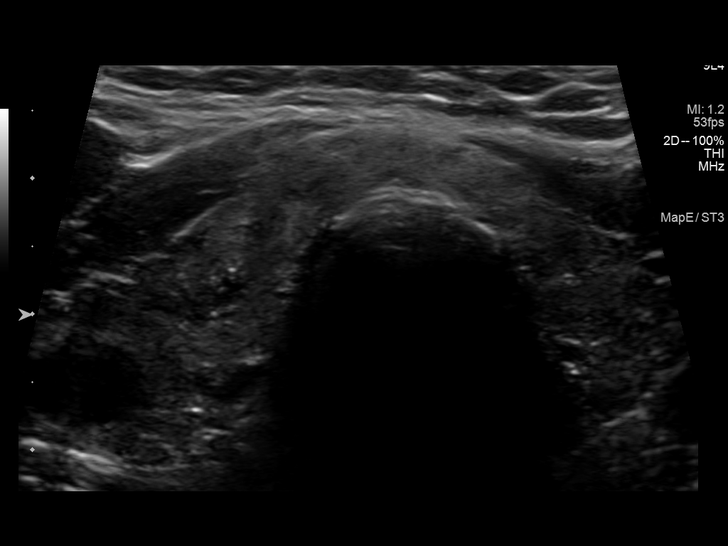
[im 6/64]
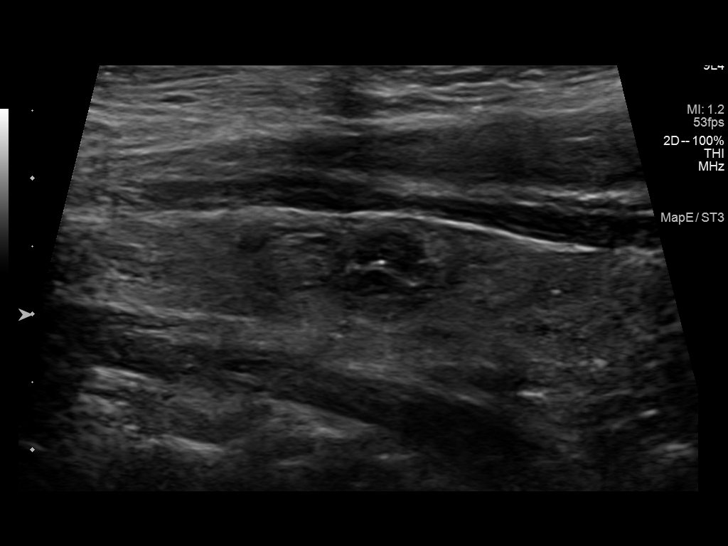
[im 11/64]
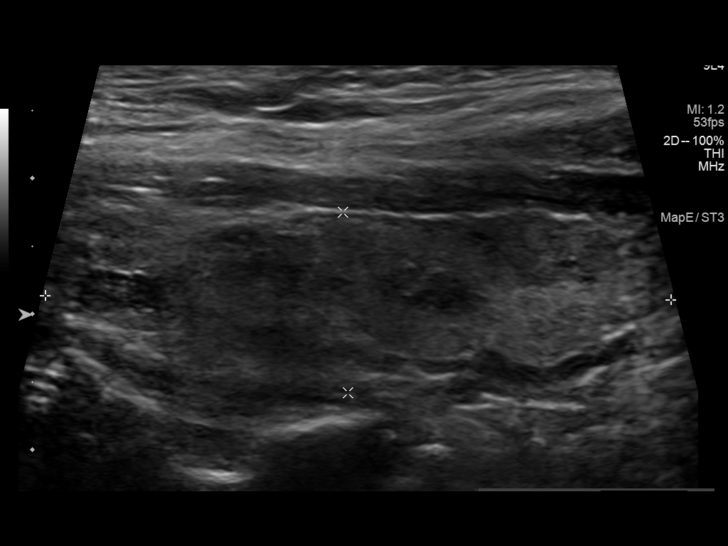
[im 16/64]
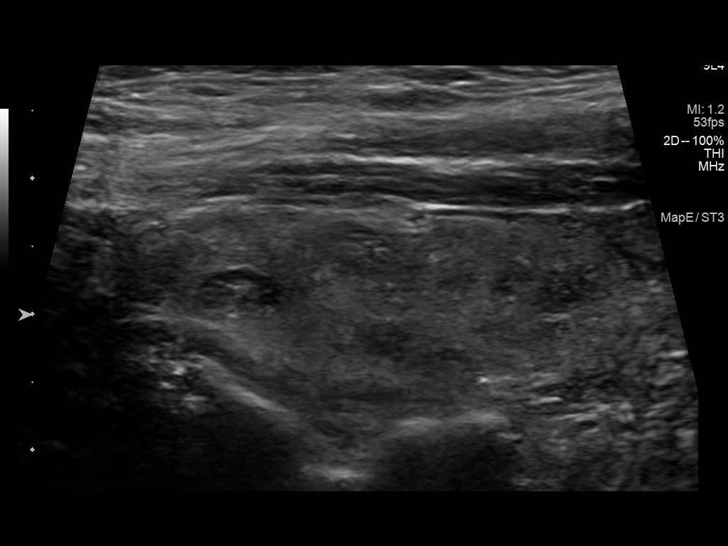
[im 22/64]
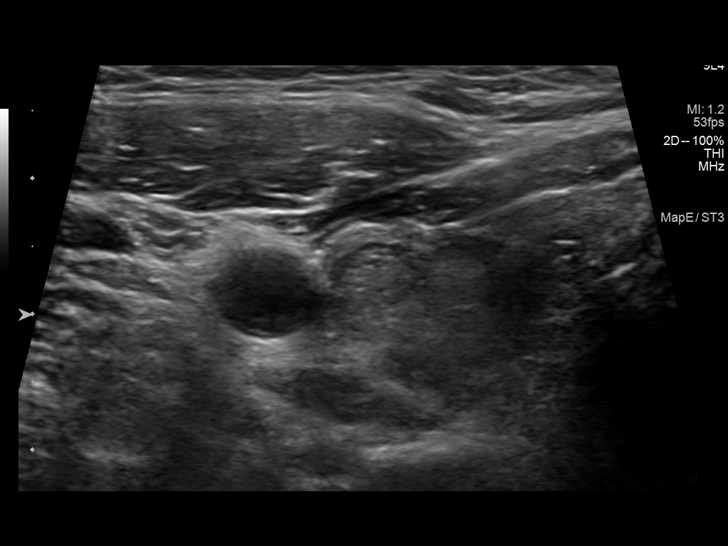
[im 27/64]
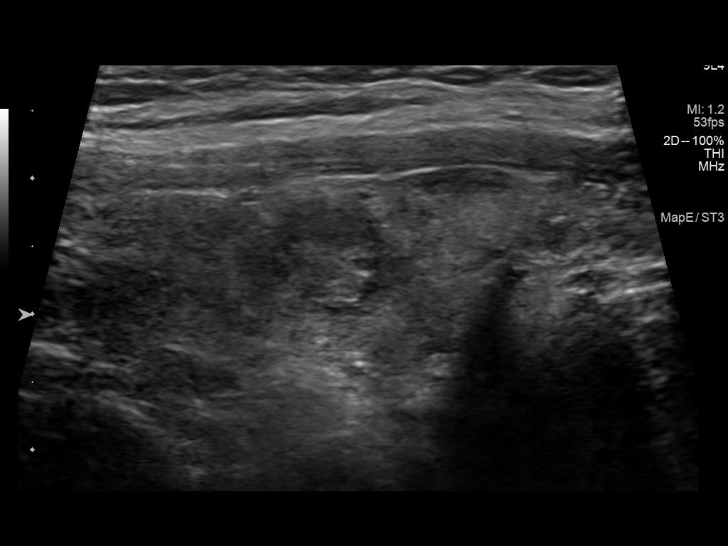
[im 32/64]
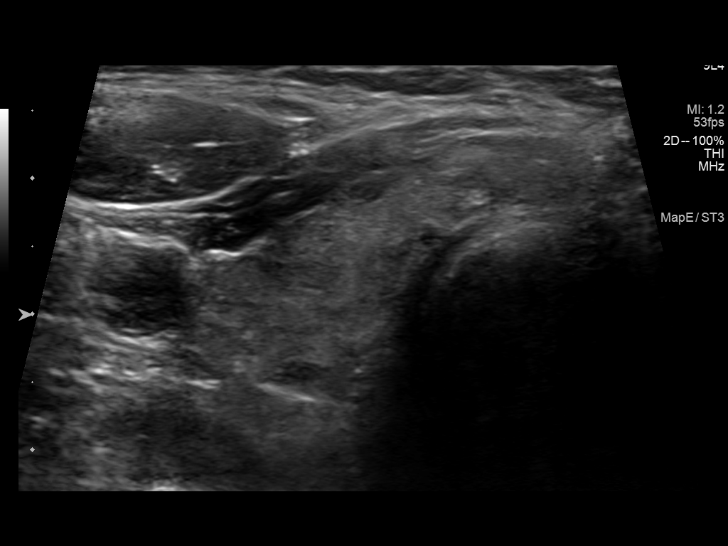
[im 37/64]
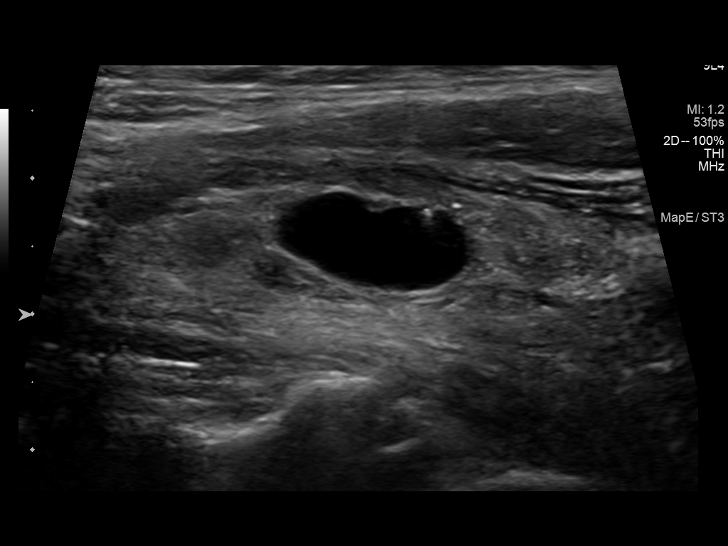
[im 43/64]
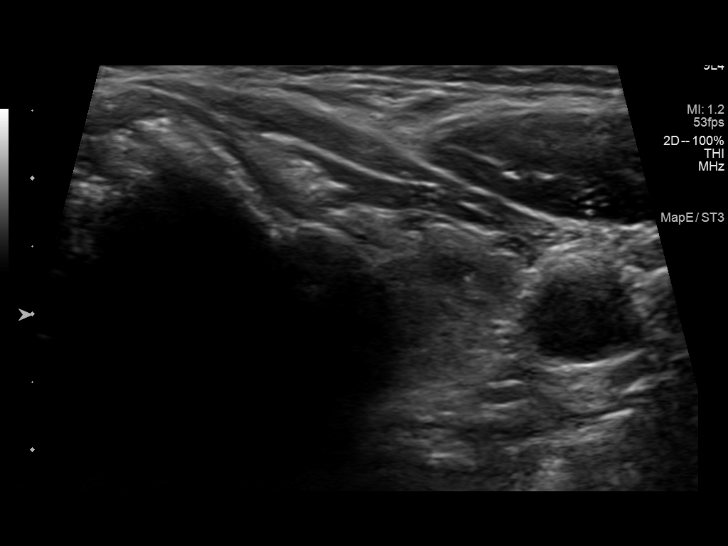
[im 48/64]
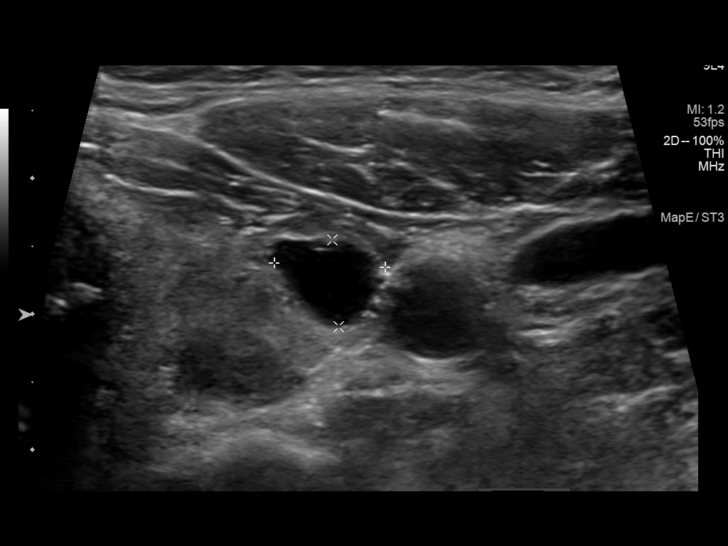
[im 53/64]
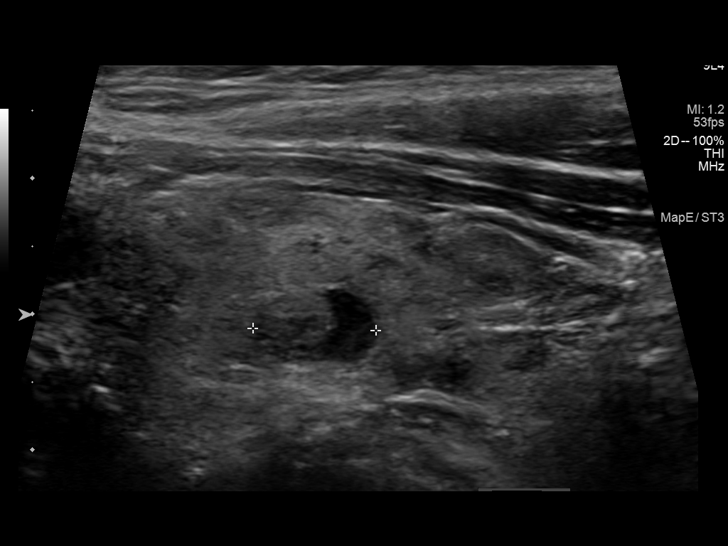
[im 58/64]
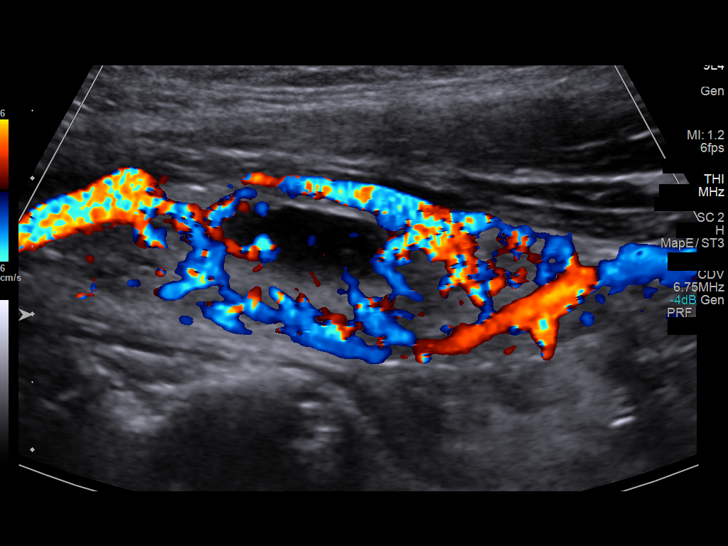
[im 64/64]
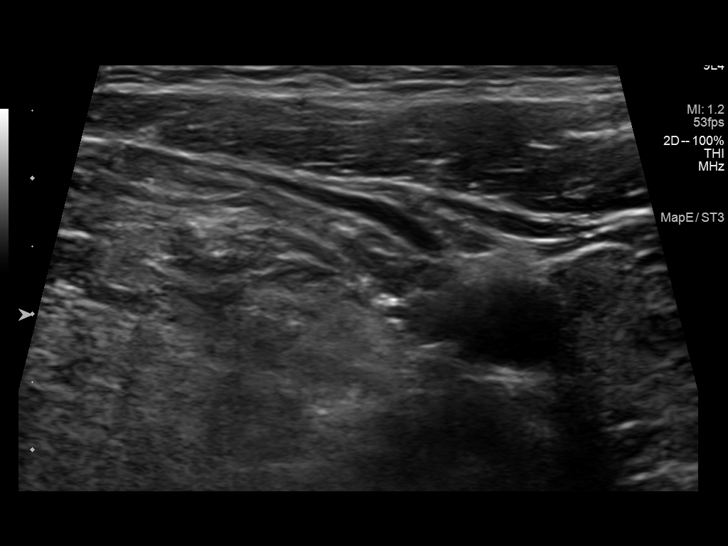

[13 of 25 positions shown; findings below may reference images not displayed]

FINDINGS: Parenchymal Echotexture: Moderately heterogenous

Isthmus: 0.3 cm

Right lobe: 4.6 cm x 1.3 cm x 2.1 cm

Left lobe: 4.2 cm x 1.3 cm x 1.9 cm

_________________________________________________________

Estimated total number of nodules >/= 1 cm: 2

Number of spongiform nodules >/=  2 cm not described below (TR1): 0

Number of mixed cystic and solid nodules >/= 1.5 cm not described
below (TR2): 0

_________________________________________________________

Nodule labeled 1 in the right mid thyroid, 1.1 cm. Nodule has
spongiform characteristics and does not meet criteria for
surveillance.

Nodule labeled 2, right lower thyroid, 8 mm with spongiform
characteristics and does not meet criteria for surveillance.

Nodule labeled 3, inferior left thyroid, 9 mm. Nodule has TR 4
characteristics and does not meet criteria for surveillance.

Nodule labeled 4, superior left thyroid, 1.5 cm, with TR 2
characteristics and does not meet criteria for surveillance.

Nodule labeled 5, mid left thyroid, 9 mm. Nodule has TR 2
characteristics and does not meet criteria for surveillance

No adenopathy.

Recommendations follow those established by the new ACR TI-RADS
criteria ([HOSPITAL] 3641;[DATE]).
IMPRESSION: Heterogeneous multinodular thyroid, potentially representing medical
thyroid disease, as above.

## 2022-07-31 ENCOUNTER — Other Ambulatory Visit: Payer: Self-pay

## 2022-07-31 DIAGNOSIS — I10 Essential (primary) hypertension: Secondary | ICD-10-CM

## 2022-07-31 MED ORDER — METOPROLOL SUCCINATE ER 50 MG PO TB24: ORAL_TABLET | ORAL | 3 refills | Status: DC

## 2022-08-02 ENCOUNTER — Encounter: Payer: Self-pay | Admitting: Family Medicine

## 2022-08-02 ENCOUNTER — Ambulatory Visit: Payer: Commercial Managed Care - PPO | Admitting: Family Medicine

## 2022-08-02 VITALS — BP 120/80 | HR 71 | Temp 97.7°F | Ht 71.0 in | Wt 234.4 lb

## 2022-08-02 DIAGNOSIS — I10 Essential (primary) hypertension: Secondary | ICD-10-CM

## 2022-08-02 DIAGNOSIS — R059 Cough, unspecified: Secondary | ICD-10-CM

## 2022-08-02 DIAGNOSIS — R1030 Lower abdominal pain, unspecified: Secondary | ICD-10-CM | POA: Diagnosis not present

## 2022-08-02 DIAGNOSIS — E0590 Thyrotoxicosis, unspecified without thyrotoxic crisis or storm: Secondary | ICD-10-CM | POA: Diagnosis not present

## 2022-08-02 LAB — URINALYSIS, ROUTINE W REFLEX MICROSCOPIC
Bilirubin Urine: NEGATIVE
Hgb urine dipstick: NEGATIVE
Ketones, ur: NEGATIVE
Leukocytes,Ua: NEGATIVE
Nitrite: NEGATIVE
RBC / HPF: NONE SEEN (ref 0–?)
Specific Gravity, Urine: <=1.005 — AB (ref 1.000–1.030)
Total Protein, Urine: NEGATIVE
Urine Glucose: NEGATIVE
Urobilinogen, UA: 0.2 (ref 0.0–1.0)
WBC, UA: NONE SEEN (ref 0–?)
pH: 6.5 (ref 5.0–8.0)

## 2022-08-02 LAB — CBC WITH DIFFERENTIAL/PLATELET
Basophils Absolute: 0 10*3/uL (ref 0.0–0.1)
Basophils Relative: 0.7 % (ref 0.0–3.0)
Eosinophils Absolute: 0.2 10*3/uL (ref 0.0–0.7)
Eosinophils Relative: 3.5 % (ref 0.0–5.0)
HCT: 42.3 % (ref 36.0–46.0)
Hemoglobin: 14 g/dL (ref 12.0–15.0)
Lymphocytes Relative: 25.5 % (ref 12.0–46.0)
Lymphs Abs: 1.3 10*3/uL (ref 0.7–4.0)
MCHC: 33.1 g/dL (ref 30.0–36.0)
MCV: 97.8 fl (ref 78.0–100.0)
Monocytes Absolute: 0.5 10*3/uL (ref 0.1–1.0)
Monocytes Relative: 10 % (ref 3.0–12.0)
Neutro Abs: 3.2 10*3/uL (ref 1.4–7.7)
Neutrophils Relative %: 60.3 % (ref 43.0–77.0)
Platelets: 291 10*3/uL (ref 150.0–400.0)
RBC: 4.32 Mil/uL (ref 3.87–5.11)
RDW: 12.5 % (ref 11.5–15.5)
WBC: 5.3 10*3/uL (ref 4.0–10.5)

## 2022-08-02 LAB — COMPREHENSIVE METABOLIC PANEL
ALT: 54 U/L — ABNORMAL HIGH (ref 0–35)
AST: 37 U/L (ref 0–37)
Albumin: 4.1 g/dL (ref 3.5–5.2)
Alkaline Phosphatase: 57 U/L (ref 39–117)
BUN: 17 mg/dL (ref 6–23)
CO2: 29 mEq/L (ref 19–32)
Calcium: 10 mg/dL (ref 8.4–10.5)
Chloride: 105 mEq/L (ref 96–112)
Creatinine, Ser: 0.7 mg/dL (ref 0.40–1.20)
GFR: 91.49 mL/min (ref >60.00)
Glucose, Bld: 85 mg/dL (ref 70–99)
Potassium: 4 mEq/L (ref 3.5–5.1)
Sodium: 141 mEq/L (ref 135–145)
Total Bilirubin: 0.6 mg/dL (ref 0.2–1.2)
Total Protein: 6.6 g/dL (ref 6.0–8.3)

## 2022-08-02 LAB — POC COVID19 BINAXNOW: SARS Coronavirus 2 Ag: NEGATIVE

## 2022-08-02 LAB — T3, FREE: T3, Free: 2.7 pg/mL (ref 2.3–4.2)

## 2022-08-02 LAB — TSH: TSH: 0.99 u[IU]/mL (ref 0.35–5.50)

## 2022-08-02 LAB — T4, FREE: Free T4: 1.03 ng/dL (ref 0.60–1.60)

## 2022-08-02 MED ORDER — METOPROLOL SUCCINATE ER 50 MG PO TB24: 75.0000 mg | ORAL_TABLET | Freq: Every day | ORAL | 3 refills | Status: DC

## 2022-08-02 NOTE — Patient Instructions (Addendum)
Please stop by lab before you go If you have mychart- we will send your results within 3 business days of [place] receiving them.  If you do not have mychart- we will call you about results within 5 business days of [place] receiving them.  *please also note that you will see labs on mychart as soon as they post. I will later go in and write notes on them- will say "notes from Dr. Durene Cal"   Consider trial of xyzal nightly and Flonase 1 spray each nostril in the morning  If cough doesn't improve with allergy treatment- lets get a chest x-ray in next 2-3 works or sooner if worsens or if recurrent chills and there is no UTI  Keep taking fiber since helping with bowel movements   Recommended follow up: Return for next already scheduled visit or sooner if needed.

## 2022-08-02 NOTE — Progress Notes (Signed)
Phone [phone] In person visit   Subjective:   Nicole Campos is a 62 y.o. year old very pleasant female patient who presents for/with See problem oriented charting Chief Complaint  Patient presents with   Dizziness   Abdominal Pain    Pt c/o bad lower abd pains over the weekend and Sunday night she c/o having chills with it. Has taken AZO thinking it was an UTI.   Back Pain   Cough    Pt c/o non stop cough on and off for a couple weeks that is dry   Gynecologic Exam    Records requested.    Past Medical History-  Patient Active Problem List   Diagnosis Date Noted   Multinodular goiter 02/01/2022    Priority: High   Hyperthyroidism 11/10/2021    Priority: High   Elevated LFTs 11/10/2021    Priority: High   Goiter 11/10/2021    Priority: High   Graves disease 07/21/2021    Priority: High   Hyperlipidemia 07/19/2017    Priority: Medium    Hyperglycemia 07/19/2017    Priority: Medium    Palpitations 05/28/2017    Priority: Medium    Vertigo 05/28/2017    Priority: Medium    Hypertension 05/02/2016    Priority: Medium    Diffuse pain 05/02/2016    Priority: Medium    Arthritis     Priority: Medium    Neuropathy 09/25/2018    Priority: Low   Adhesive capsulitis of left shoulder 01/30/2017    Priority: Low   Family history of colon cancer 05/02/2016    Priority: Low   Microscopic hematuria 10/16/2011    Priority: Low    Medications- reviewed and updated Current Outpatient Medications  Medication Sig Dispense Refill   Acetaminophen Extra Strength 500 MG CAPS Take 2 capsules by mouth every 8 (eight) hours.     amitriptyline (ELAVIL) 25 MG tablet Take 1 tablet by mouth at bedtime as needed.     diazepam (VALIUM) 5 MG tablet Take 1 tablet (5 mg total) by mouth every 12 (twelve) hours as needed for anxiety (do not drive for 12 hours after taking). 11 tablet 0   gabapentin (NEURONTIN) 300 MG capsule Take 300 mg by mouth 2 (two) times daily.     MAGNESIUM  GLUCONATE PO Take 2 tablets by mouth daily.     metoprolol succinate (TOPROL-XL) 50 MG 24 hr tablet Take 1.5 tablets (75 mg total) by mouth daily. Take with or immediately following a meal. 137 tablet 3   PENNSAID 2 % SOLN      tiZANidine (ZANAFLEX) 4 MG tablet Take 4 mg by mouth 2 (two) times daily.     Vitamin D, Cholecalciferol, 25 MCG (1000 UT) CAPS Take 1 capsule by mouth daily in the afternoon.     No current facility-administered medications for this visit.     Objective:  BP 120/80   Pulse 71   Temp 97.7 F (36.5 C)   Ht 5\' 11"  (1.803 m)   Wt 234 lb 6.4 oz (106.3 kg)   SpO2 100%   BMI 32.69 kg/m  Gen: NAD, resting comfortably CV: RRR no murmurs rubs or gallops Lungs: CTAB no crackles, wheeze, rhonchi Abdomen: nondistended  Ext: no edema Skin: warm, dry   Results for orders placed or performed in visit on 08/02/22 (from the past 24 hour(s))  POC COVID-19     Status: None   Collection Time: 08/02/22  9:20 AM  Result Value Ref Range  SARS Coronavirus 2 Ag Negative Negative       Assessment and Plan   # Health maintenance-requesting Pap smear from GYN  #social update- lost a very good friend- died quickly on 08-30-22 and wonders if that could contribute to how she is feeling. 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   # Lower abdominal pain # Back pain Was out of town this weekend. Over the weekend she started noticing lower abdominal pain- felt like being jabbed with knives- was having less frequent bowel movements and wondered if that contributed.  On Sunday evening she noted chills to go along with this.  She took Azo thinking it may be a urinary tract infection- symptoms improved.   She is also noted some back pain with this- has had intermittent issues and worked with chiropractor- tried to work with her chiropractor   Back pain, lower abdominal pain, chills have improved. Chills resolved in fact. Back pain much better- mildly stiff closer to baseline.  Still with persistent lower abdominal pain but less severe - 1-2/10. Feels almost "stopped up" - did have small bowel movement yesterday- took a fiber supplement to see if that would help. No burning with peeing or frequent urination.  A/P: Patient with lower abdominal pain over the weekend that has largely resolved-she wants to rule out UTI I think is reasonable-urine culture and urinalysis was ordered -COVID test negative today in regards to chills-we discussed that white count is elevated may want to go ahead and get a chest x-ray in regards to cough as noted below -Abdominal pain could also have been caused by constipation-abdominal mass today but somewhat firm-she is going to try keeping it going with her fiber supplement-was helpful over the weekend  # Cough/concern for allergies S: Patient reports having a difficult to control cough off and on for a few weeks.  Reports this is dry . Cough seemed to worsen when had lower abdominal pain. No fever.  -Dizziness/vertigo issues  worse if sinuses are congested. Can get a wave of dizziness with coming to a stop - allegra not helpful lately. Sudafed more helpful as is afrin- advised against these. Does not always tolerate nasal steroids.   A/P: Concerned that cough could be coming from allergies-COVID test negative.  With chills over the weekend consider doing chest x-ray but we opted to do this only if symptoms worsen or recur or fail to improve with allergy treatment- From AVS:  " Consider trial of xyzal nightly and Flonase 1 spray each nostril in the morning  If cough doesn't improve with allergy treatment- lets get a chest x-ray in next 2-3 works or sooner if worsens or if recurrent chills and there is no UTI "  # HypERthyroidism INITIALLY/LATER GRAVES S: Presented with palpitations-seen by endocrinology Dr. Lonzo Cloud and started on methimazole 5 mg to be titrated as needed.  She is just felt off over the weekend as well Lab Results  Component  Value Date   TSH 1.02 06/08/2022  A/P: Patient would like to rule out thyroid dysfunction by updating TSH, T3, T4 today-for now-remains off treatment due to prior LFT elevations and normalization of levels  #hypertension S: medication: metoprolol 50 mg XR.  Dr. Berline Chough had reached out to me about increasing her metoprolol due to significant amount of tension-similar issues with chiropractor or physical therapy-tendency to hold her breath even in physical therapy makes it challenging. Overhead seems to be most bothersome and can feel dizzy/vertiginous at times-long-term  3 of vertigo issues and worse with allergy issues-see section above BP Readings from Last 3 Encounters:  08/02/22 120/80  06/08/22 120/80  04/07/22 118/70  A/P: Blood pressure is well-controlled but patient's osteopathic sports medicine physician Dr. Mora Bellman reached out to me about potentially increasing her metoprolol to help with overall tension-patient very interested and I increased this today to 75 mg extended release  Recommended follow up: Return for next already scheduled visit or sooner if needed. Future Appointments  Date Time Provider Department Center  10/12/2022  1:00 PM Shelva Majestic, MD [facility]  06/08/2023  7:50 AM Shamleffer, Konrad Dolores, MD [facility] None    Lab/Order associations:   ICD-10-CM   1. Cough, unspecified type  R05.9 POC COVID-19    2. Lower abdominal pain  R10.30 Urine Culture    Urinalysis, Routine w reflex microscopic    3. Essential hypertension  I10 Comprehensive metabolic panel    CBC with Differential/Platelet    4. Hyperthyroidism  E05.90 TSH    T4, free    T3, free      Meds ordered this encounter  Medications   metoprolol succinate (TOPROL-XL) 50 MG 24 hr tablet    Sig: Take 1.5 tablets (75 mg total) by mouth daily. Take with or immediately following a meal.    Dispense:  137 tablet    Refill:  3    Return precautions advised.  Tana Conch, MD

## 2022-08-03 LAB — URINE CULTURE
MICRO NUMBER:: 14990288
Result:: NO GROWTH
SPECIMEN QUALITY:: ADEQUATE

## 2022-08-10 ENCOUNTER — Other Ambulatory Visit: Payer: Self-pay | Admitting: Sports Medicine

## 2022-08-25 ENCOUNTER — Encounter: Payer: Self-pay | Admitting: Family Medicine

## 2022-08-29 ENCOUNTER — Other Ambulatory Visit: Payer: Self-pay

## 2022-08-29 MED ORDER — METOPROLOL SUCCINATE ER 50 MG PO TB24: 50.0000 mg | ORAL_TABLET | Freq: Two times a day (BID) | ORAL | 3 refills | Status: DC

## 2022-09-28 ENCOUNTER — Ambulatory Visit: Payer: Commercial Managed Care - PPO | Attending: Sports Medicine | Admitting: Physical Therapy

## 2022-09-28 ENCOUNTER — Other Ambulatory Visit: Payer: Self-pay

## 2022-09-28 ENCOUNTER — Encounter: Payer: Self-pay | Admitting: Physical Therapy

## 2022-09-28 DIAGNOSIS — R2681 Unsteadiness on feet: Secondary | ICD-10-CM | POA: Insufficient documentation

## 2022-09-28 DIAGNOSIS — R42 Dizziness and giddiness: Secondary | ICD-10-CM | POA: Insufficient documentation

## 2022-09-28 NOTE — Therapy (Signed)
OUTPATIENT PHYSICAL THERAPY VESTIBULAR EVALUATION     Patient Name: Nicole Campos MRN: [id number] DOB:11-26-1960, 62 y.o., female Today's Date: 09/28/2022  END OF SESSION:  PT End of Session - 09/28/22 1150     Visit Number 1    Number of Visits 9    Date for PT Re-Evaluation 11/24/22    Authorization Type [employer]-60 combined PT/OT/speech-40 used (20 remaining)    Authorization - Visit Number 1    Authorization - Number of Visits 9   of 20 remaining   PT Start Time 0849    PT Stop Time 0932    PT Time Calculation (min) 43 min    Activity Tolerance Patient tolerated treatment well;Patient limited by pain   migrainesymptoms begin during eval   Behavior During Therapy WFL for tasks assessed/performed             Past Medical History:  Diagnosis Date   Arthritis    cervical, lumbar, knees, feet. mild hands   Headache(784.0)    Hx: of occasinal Migraine   Heart palpitations    Hx: of   Hypertension    PONV (postoperative nausea and vomiting)    Past Surgical History:  Procedure Laterality Date   APPENDECTOMY     CESAREAN SECTION  1990   CHOLECYSTECTOMY N/A 02/28/2013   Procedure: LAPAROSCOPIC CHOLECYSTECTOMY;  Surgeon: Axel Filler, MD;  Location: [facility];  Service: General;  Laterality: N/A;   COLONOSCOPY W/ POLYPECTOMY     Hx: of   KNEE SURGERY     Left knee - meniscus tear   LAPAROSCOPIC APPENDECTOMY N/A 08/05/2012   Procedure: APPENDECTOMY LAPAROSCOPIC;  Surgeon: Clovis Pu. Cornett, MD;  Location: [facility];  Service: General;  Laterality: N/A;   wisdom teeth     Patient Active Problem List   Diagnosis Date Noted   Multinodular goiter 02/01/2022   Hyperthyroidism 11/10/2021   Elevated LFTs 11/10/2021   Goiter 11/10/2021   Graves disease 07/21/2021   Neuropathy 09/25/2018   Hyperlipidemia 07/19/2017   Hyperglycemia 07/19/2017   Palpitations 05/28/2017   Vertigo 05/28/2017   Adhesive capsulitis of left shoulder 01/30/2017   Hypertension 05/02/2016    Diffuse pain 05/02/2016   Family history of colon cancer 05/02/2016   Arthritis    Microscopic hematuria 10/16/2011    PCP: Shelva Majestic, MD REFERRING PROVIDER: Andrena Mews, DO   REFERRING DIAG: R42 (ICD-10-CM) - Vertigo   THERAPY DIAG:  Dizziness and giddiness  Unsteadiness on feet  ONSET DATE: 6 weeks ago  Rationale for Evaluation and Treatment: Rehabilitation  SUBJECTIVE:   SUBJECTIVE STATEMENT: Had vertigo for 10 years, feel like it's slowly getting worse.  I'm very tense through shoulders and neck.  Try to move slowly and avoid positions that would bring on dizziness.  Had shoulder surgery November 2023. Pt accompanied by: self  PERTINENT HISTORY: vertigo, Graves disease, HLD, HTN, arthritis, neuropathy, R shoulder surgery (biceps tendinosis, RTC tear) November (has been seeing ortho PT-discharging 09/28/22 per pt report); hx of migraines (with aura)  PAIN:  Are you having pain? Yes: NPRS scale: 7/10 Pain location: neck and shoulders Pain description: tense pain  Aggravating factors: anxiety, tense Relieving factors: DN, ortho PT  PRECAUTIONS: Fall and Other: hx of migraines  RED FLAGS: None   WEIGHT BEARING RESTRICTIONS: No  FALLS: Has patient fallen in last 6 months? No  LIVING ENVIRONMENT: Lives with: lives with their family Lives in: House/apartment  PLOF: Independent  PATIENT GOALS: To get rid of this dizziness  OBJECTIVE:   DIAGNOSTIC FINDINGS: NA  COGNITION: Overall cognitive status: Within functional limits for tasks assessed   Cervical ROM:    Active A/PROM (deg) eval  Flexion 16  Extension 16  Right lateral flexion   Left lateral flexion   Right rotation 34  Left rotation 35  (Blank rows = not tested)  TRANSFERS: Assistive device utilized: None  Sit to stand: Complete Independence Stand to sit: Complete Independence  PATIENT SURVEYS:  FOTO Intake 43; Predicted 56  VESTIBULAR ASSESSMENT:  GENERAL OBSERVATION:  Appears and reports being tense through shoulders   SYMPTOM BEHAVIOR:  Subjective history: Always have had sea sickness and motion sickness.  Was working with ortho PT on stomach and turned over-immediate seasick feeling and migraine.  Have trouble bending down to pick up things in yard.  Any sharp, quick movements bring it on.  Hx of migraines since 62 yo.  Non-Vestibular symptoms: neck pain, migraine symptoms, and hx of seasickness and motion sickness  Type of dizziness: Spinning/Vertigo, Unsteady with head/body turns, "Funny feeling in the head", and floating feeling in head  Frequency: depends   Duration: hours  Aggravating factors: Induced by position change: prone to supine and Induced by motion: bending down to the ground, turning body quickly, turning head quickly, and quick turns of head/eyes  Relieving factors: head stationary, dark room, closing eyes, slow movements, and Has meclizine  Progression of symptoms: worse  OCULOMOTOR EXAM:  Ocular Alignment: abnormal and L eye adducted  Ocular ROM: No Limitations and feels like "eyes cross"  Spontaneous Nystagmus: absent  Gaze-Induced Nystagmus: absent  Smooth Pursuits: intact  Saccades: intact  Convergence/Divergence: 9.5 inches    VESTIBULAR - OCULAR REFLEX:   Slow VOR: Comment: rates beginning of floating feeling; horizontal rates as 1/10 Vertical-loses target after 3rd rep  VOR Cancellation: Comment: starts to sweat, bringing on headache 2/10   *Did not proceed with further testing due to pt's reports of migraines, increased tension through shoulders and neck  Head-Impulse Test: NT  Dynamic Visual Acuity: Not able to be assessed   POSITIONAL TESTING: Other: NT today to due pt tolerance, no spinning/positional vertigo complaints today    M-CTSIB  Condition 1: Firm Surface, EO 30 Sec, Mild Sway  Condition 2: Firm Surface, EC 30 Sec, Mild Sway, more to L  Condition 3: Foam Surface, EO 30 Sec, Mild Sway  Condition 4: Foam  Surface, EC 7-15 Sec, Severe Sway forward and L sway      VESTIBULAR TREATMENT:                                                                                                   DATE: 09/28/2022   PATIENT EDUCATION: Education details: PT eval results, POC, initial HEP Person educated: Patient Education method: Explanation, Demonstration, and Handouts Education comprehension: verbalized understanding, returned demonstration, and needs further education  HOME EXERCISE PROGRAM: Access Code: [email or web address] URL: [email or web address] Date: 09/28/2022 Prepared by: [facility] - Outpatient  Rehab - [facility]  Exercises - Narrow Stance with Counter Support  - 1 x daily - 7 x weekly - 1  sets - 3 reps - 30 sec hold  GOALS: Goals reviewed with patient? Yes  SHORT TERM GOALS: Target date: 10/27/2022  Pt will be independent with HEP for improved dizziness, vertigo, balance. Baseline: Goal status: INITIAL  2.  Pt will perform Condition 4 on MCTSIB for 30 seconds with moderate or less sway. Baseline: 7-15 sec severe sway Goal status: INITIAL  3.  Pt will report at least 50% decrease in vertigo symptoms with daily activities. Baseline:  Goal status: INITIAL  LONG TERM GOALS: Target date: 11/24/2022  Pt will be independent with HEP for improved dizziness, vertigo and balance. Baseline:  Goal status: INITIAL  2.  Pt will perform MCTSIB condition 4 with mild or less sway for improved multi-system sensory balance. Baseline:  Goal status: INITIAL  3.  FGA to be assessed and goal written as appropriate. Baseline:  Goal status: INITIAL  4.  Pt will improve FOTO score to 56 to demo improved vestibular symptoms. Baseline: 43 Goal status: INITIAL   ASSESSMENT:  CLINICAL IMPRESSION: Patient is a 62 y.o. female who was seen today for physical therapy evaluation and treatment for vertigo.   She reports longstanding hx of vertigo, as well as hx of migraines and motion  sickness.  She reports episode in the last 4-6 weeks, while working with ortho PT for shoulder rehab, going from prone>supine, which brought on intense sea-sickness/motion sickness and migraine symptoms.  She reports "floating" sensation, not necessarily spinning.  She does report taking Meclizine to help calm symptoms.  She presents today with some eye alignment difference on L, >normal convergence distance, migraine symptoms brought on by VOR and VOR cancellation motions.  Due to migraine symptom presentation and increased tension noted in shoulders/neck, did not proceed with head impulse test or visual acuity.  Due to symptoms not reported in line with positional vertigo today, did not proceed to perform positional testing.  Pt does demonstrate decreased vestibular system use on MCTSIB, with inability to hold position on compliant surface with EC.  Given pt's hx of migraines and reports of guarded mobility to lessen/prevent vertigo symptoms, patient will benefit from skilled PT to work to address vestibular symptoms, motion sensitivity, decreased vestibular system use for balance and will continue to further assess for BPPV as needed.  OBJECTIVE IMPAIRMENTS: decreased activity tolerance, decreased balance, dizziness, impaired flexibility, and pain.   ACTIVITY LIMITATIONS: bending, squatting, reach over head, and locomotion level  PARTICIPATION LIMITATIONS: meal prep, cleaning, laundry, driving, shopping, community activity, occupation, and yard work  PERSONAL FACTORS: 3+ comorbidities: PMH above, including hx of migraines, motion sickness  are also affecting patient's functional outcome.   REHAB POTENTIAL: Good  CLINICAL DECISION MAKING: Evolving/moderate complexity  EVALUATION COMPLEXITY: Moderate   PLAN:  PT FREQUENCY: 1-2x/week  PT DURATION: 8 weeks  PLANNED INTERVENTIONS: Therapeutic exercises, Therapeutic activity, Neuromuscular re-education, Balance training, Gait training,  Patient/Family education, Self Care, Vestibular training, Canalith repositioning, and Manual therapy  PLAN FOR NEXT SESSION: ask about migraine medications.  Assess HEP and progress to gentle head motions, varied surfaces; assess visual acuity and HIT.  Assess BPPV and treat as needed.   Gean Maidens., PT 09/28/2022, 11:52 AM   [facility] [address] [place], [address] [place], [place] [address] Phone # [phone] Fax # [phone]

## 2022-10-05 ENCOUNTER — Encounter: Payer: Self-pay | Admitting: Physical Therapy

## 2022-10-05 ENCOUNTER — Ambulatory Visit: Payer: Commercial Managed Care - PPO | Admitting: Physical Therapy

## 2022-10-05 DIAGNOSIS — R42 Dizziness and giddiness: Secondary | ICD-10-CM | POA: Diagnosis not present

## 2022-10-05 DIAGNOSIS — R2681 Unsteadiness on feet: Secondary | ICD-10-CM

## 2022-10-05 NOTE — Therapy (Signed)
OUTPATIENT PHYSICAL THERAPY VESTIBULAR TREATMENT     Patient Name: Nicole Campos MRN: [id number] DOB:[birth year], 62 y.o., female Today's Date: 10/06/2022  END OF SESSION:  PT End of Session - 10/05/22 1317     Visit Number 2    Number of Visits 9    Date for PT Re-Evaluation 11/24/22    Authorization Type UHC-60 combined PT/OT/speech-40 used (20 remaining)    Authorization - Visit Number 2    Authorization - Number of Visits 9   of 20 remaining   PT Start Time 1318    PT Stop Time 1402    PT Time Calculation (min) 44 min    Activity Tolerance Patient tolerated treatment well    Behavior During Therapy WFL for tasks assessed/performed;Anxious             Past Medical History:  Diagnosis Date   Arthritis    cervical, lumbar, knees, feet. mild hands   Headache(784.0)    Hx: of occasinal Migraine   Heart palpitations    Hx: of   Hypertension    PONV (postoperative nausea and vomiting)    Past Surgical History:  Procedure Laterality Date   APPENDECTOMY     CESAREAN SECTION  1990   CHOLECYSTECTOMY N/A 02/28/2013   Procedure: LAPAROSCOPIC CHOLECYSTECTOMY;  Surgeon: Axel Filler, MD;  Location: [facility];  Service: General;  Laterality: N/A;   COLONOSCOPY W/ POLYPECTOMY     Hx: of   KNEE SURGERY     Left knee - meniscus tear   LAPAROSCOPIC APPENDECTOMY N/A 08/05/2012   Procedure: APPENDECTOMY LAPAROSCOPIC;  Surgeon: Clovis Pu. Cornett, MD;  Location: [facility];  Service: General;  Laterality: N/A;   wisdom teeth     Patient Active Problem List   Diagnosis Date Noted   Multinodular goiter 02/01/2022   Hyperthyroidism 11/10/2021   Elevated LFTs 11/10/2021   Goiter 11/10/2021   Graves disease 07/21/2021   Neuropathy 09/25/2018   Hyperlipidemia 07/19/2017   Hyperglycemia 07/19/2017   Palpitations 05/28/2017   Vertigo 05/28/2017   Adhesive capsulitis of left shoulder 01/30/2017   Hypertension 05/02/2016   Diffuse pain 05/02/2016   Family history of colon cancer  05/02/2016   Arthritis    Microscopic hematuria 10/16/2011    PCP: Shelva Majestic, MD REFERRING PROVIDER: Andrena Mews, DO   REFERRING DIAG: R42 (ICD-10-CM) - Vertigo   THERAPY DIAG:  Dizziness and giddiness  Unsteadiness on feet  ONSET DATE: 6 weeks ago  Rationale for Evaluation and Treatment: Rehabilitation  SUBJECTIVE:   SUBJECTIVE STATEMENT: Tried the exercise at the counter when brushing my teeth.  Manage the migraines with Advil and plenty of water.  Typically don't have pain with the migraines, mostly the aura and not able to focus on the computer screen.  No episodes of the room spinning dizziness, it's  more "floating" Pt accompanied by: self  PERTINENT HISTORY: vertigo, Graves disease, HLD, HTN, arthritis, neuropathy, R shoulder surgery (biceps tendinosis, RTC tear) November (has been seeing ortho PT-discharging 09/28/22 per pt report); hx of migraines (with aura)  PAIN:  Are you having pain? Yes: NPRS scale: always there/10 Pain location: neck and shoulders Pain description: tense pain  Aggravating factors: anxiety, tense Relieving factors: DN, ortho PT  PRECAUTIONS: Fall and Other: hx of migraines  RED FLAGS: None   WEIGHT BEARING RESTRICTIONS: No  FALLS: Has patient fallen in last 6 months? No *Did fall once on steps in May and hit face.  LIVING ENVIRONMENT: Lives with: lives with  their family Lives in: House/apartment  PLOF: Independent  PATIENT GOALS: To get rid of this dizziness  OBJECTIVE:   *Reports feels like if I roll too quickly to right, I'm afraid it will come on   TODAY'S TREATMENT: 10/05/2022 Activity Comments  L DH Negative, brief sensation of floating feeling, no dizziness  R DH Negative  L roll test Negative  R roll test Negative  Reviewed Romberg stance EO and EC Return demo understanding  Standing feet apart EC head turns x 5, head nods x 5 Intermittent UE support, mild sway  HIT R Negative, no symptoms  HIT L  Negative, no symptoms  Seated Gaze stabilization-horizontal and vertical, x 30 sec No c/o symptoms, performs slowly    HOME EXERCISE PROGRAM: Access Code: [email or web address] URL: [email or web address] Date: 10/06/2022 Prepared by: [facility] - Outpatient  Rehab - [facility]  Exercises - Narrow Stance with Counter Support  - 1 x daily - 7 x weekly - 1 sets - 3 reps - 30 sec hold - Seated Gaze Stabilization with Head Rotation  - 1-2 x daily - 7 x weekly - 1 sets - 3 reps - 30 sec hold - Seated Gaze Stabilization with Head Nod  - 1-2 x daily - 7 x weekly - 1 sets - 3 reps - 30 sec hold  PATIENT EDUCATION: Education details: rationale for vestibular rehab-habituation explanation, BPPV findings (negative), addition to HEP Person educated: Patient Education method: Programmer, multimedia, Demonstration, Verbal cues, and Handouts Education comprehension: verbalized understanding and returned demonstration  ------------------------------------------------------------------  DIAGNOSTIC FINDINGS: NA  COGNITION: Overall cognitive status: Within functional limits for tasks assessed   Cervical ROM:    Active A/PROM (deg) eval  Flexion 16  Extension 16  Right lateral flexion   Left lateral flexion   Right rotation 34  Left rotation 35  (Blank rows = not tested)  TRANSFERS: Assistive device utilized: None  Sit to stand: Complete Independence Stand to sit: Complete Independence  PATIENT SURVEYS:  FOTO Intake 43; Predicted 56  VESTIBULAR ASSESSMENT:  GENERAL OBSERVATION: Appears and reports being tense through shoulders   SYMPTOM BEHAVIOR:  Subjective history: Always have had sea sickness and motion sickness.  Was working with ortho PT on stomach and turned over-immediate seasick feeling and migraine.  Have trouble bending down to pick up things in yard.  Any sharp, quick movements bring it on.  Hx of migraines since 62 yo.  Non-Vestibular symptoms: neck pain, migraine symptoms, and  hx of seasickness and motion sickness  Type of dizziness: Spinning/Vertigo, Unsteady with head/body turns, "Funny feeling in the head", and floating feeling in head  Frequency: depends   Duration: hours  Aggravating factors: Induced by position change: prone to supine and Induced by motion: bending down to the ground, turning body quickly, turning head quickly, and quick turns of head/eyes  Relieving factors: head stationary, dark room, closing eyes, slow movements, and Has meclizine  Progression of symptoms: worse  OCULOMOTOR EXAM:  Ocular Alignment: abnormal and L eye adducted  Ocular ROM: No Limitations and feels like "eyes cross"  Spontaneous Nystagmus: absent  Gaze-Induced Nystagmus: absent  Smooth Pursuits: intact  Saccades: intact  Convergence/Divergence: 9.5 inches    VESTIBULAR - OCULAR REFLEX:   Slow VOR: Comment: rates beginning of floating feeling; horizontal rates as 1/10 Vertical-loses target after 3rd rep  VOR Cancellation: Comment: starts to sweat, bringing on headache 2/10   *Did not proceed with further testing due to pt's reports of migraines, increased tension through shoulders and  neck  Head-Impulse Test: NT  Dynamic Visual Acuity: Not able to be assessed   POSITIONAL TESTING: Other: NT today to due pt tolerance, no spinning/positional vertigo complaints today    M-CTSIB  Condition 1: Firm Surface, EO 30 Sec, Mild Sway  Condition 2: Firm Surface, EC 30 Sec, Mild Sway, more to L  Condition 3: Foam Surface, EO 30 Sec, Mild Sway  Condition 4: Foam Surface, EC 7-15 Sec, Severe Sway forward and L sway      VESTIBULAR TREATMENT:                                                                                                   DATE: 09/28/2022   PATIENT EDUCATION: Education details: PT eval results, POC, initial HEP Person educated: Patient Education method: Explanation, Demonstration, and Handouts Education comprehension: verbalized understanding, returned  demonstration, and needs further education  HOME EXERCISE PROGRAM: Access Code: [email or web address] URL: [email or web address] Date: 09/28/2022 Prepared by: [facility] - Outpatient  Rehab - [facility]  Exercises - Narrow Stance with Counter Support  - 1 x daily - 7 x weekly - 1 sets - 3 reps - 30 sec hold  GOALS: Goals reviewed with patient? Yes  SHORT TERM GOALS: Target date: 10/27/2022  Pt will be independent with HEP for improved dizziness, vertigo, balance. Baseline: Goal status: INITIAL  2.  Pt will perform Condition 4 on MCTSIB for 30 seconds with moderate or less sway. Baseline: 7-15 sec severe sway Goal status: INITIAL  3.  Pt will report at least 50% decrease in vertigo symptoms with daily activities. Baseline:  Goal status: INITIAL  LONG TERM GOALS: Target date: 11/24/2022  Pt will be independent with HEP for improved dizziness, vertigo and balance. Baseline:  Goal status: INITIAL  2.  Pt will perform MCTSIB condition 4 with mild or less sway for improved multi-system sensory balance. Baseline:  Goal status: INITIAL  3.  FGA to be assessed and goal written as appropriate. Baseline:  Goal status: INITIAL  4.  Pt will improve FOTO score to 56 to demo improved vestibular symptoms. Baseline: 43 Goal status: INITIAL   ASSESSMENT:  CLINICAL IMPRESSION: Pt returns today and reports overall being anxious about bringing on dizziness symptoms.  She does continue to report no actual room spinning dizziness, more as floating, nausea, and with standing activities, unsteadiness and increased sway.  Assessed for positional vertigo today, with no nystagmus and no symptoms noted.  Worked on gaze stabilization, adding to HEP.  With review of HEP, pt performs Romberg standing well, with minimal sway, though pt reports she feels excess sway.  Continued to educate patient on ways to address her deficits-decreased vestibular system use, VOR/gaze stabilization, and likely  some motion sensitivity (hx of motion sickness for years).  Will continue to work towards goals; pt without complaints at end of session.    OBJECTIVE IMPAIRMENTS: decreased activity tolerance, decreased balance, dizziness, impaired flexibility, and pain.   ACTIVITY LIMITATIONS: bending, squatting, reach over head, and locomotion level  PARTICIPATION LIMITATIONS: meal prep, cleaning, laundry, driving, shopping, community activity, occupation, and yard work  PERSONAL FACTORS: 3+ comorbidities: PMH above, including hx of migraines, motion sickness  are also affecting patient's functional outcome.   REHAB POTENTIAL: Good  CLINICAL DECISION MAKING: Evolving/moderate complexity  EVALUATION COMPLEXITY: Moderate   PLAN:  PT FREQUENCY: 1-2x/week  PT DURATION: 8 weeks  PLANNED INTERVENTIONS: Therapeutic exercises, Therapeutic activity, Neuromuscular re-education, Balance training, Gait training, Patient/Family education, Self Care, Vestibular training, Canalith repositioning, and Manual therapy  PLAN FOR NEXT SESSION:  Assess HEP; multi-system sensory balance retraining.   Progress VOR positions for habituation and may need to address motion sensitivity   Rhythm Gubbels W., PT 10/06/2022, 7:34 AM   [facility] at [facility] [address] [place], [address] [place], [place] [address] Phone # [phone] Fax # [phone]

## 2022-10-09 NOTE — Therapy (Signed)
OUTPATIENT PHYSICAL THERAPY VESTIBULAR TREATMENT     Patient Name: Nicole Campos MRN: [id number] DOB:March 31, 1960, 62 y.o., female Today's Date: 10/10/2022  END OF SESSION:  PT End of Session - 10/10/22 1233     Visit Number 3    Number of Visits 9    Date for PT Re-Evaluation 11/24/22    Authorization Type [employer]-60 combined PT/OT/speech-40 used (20 remaining)    Authorization - Visit Number 3    Authorization - Number of Visits 9   of 20 remaining   PT Start Time 1149    PT Stop Time 1231    PT Time Calculation (min) 42 min    Activity Tolerance Patient tolerated treatment well    Behavior During Therapy WFL for tasks assessed/performed;Anxious              Past Medical History:  Diagnosis Date   Arthritis    cervical, lumbar, knees, feet. mild hands   Headache(784.0)    Hx: of occasinal Migraine   Heart palpitations    Hx: of   Hypertension    PONV (postoperative nausea and vomiting)    Past Surgical History:  Procedure Laterality Date   APPENDECTOMY     CESAREAN SECTION  1990   CHOLECYSTECTOMY N/A 02/28/2013   Procedure: LAPAROSCOPIC CHOLECYSTECTOMY;  Surgeon: Axel Filler, MD;  Location: [facility];  Service: General;  Laterality: N/A;   COLONOSCOPY W/ POLYPECTOMY     Hx: of   KNEE SURGERY     Left knee - meniscus tear   LAPAROSCOPIC APPENDECTOMY N/A 08/05/2012   Procedure: APPENDECTOMY LAPAROSCOPIC;  Surgeon: Clovis Pu. Cornett, MD;  Location: [facility];  Service: General;  Laterality: N/A;   wisdom teeth     Patient Active Problem List   Diagnosis Date Noted   Multinodular goiter 02/01/2022   Hyperthyroidism 11/10/2021   Elevated LFTs 11/10/2021   Goiter 11/10/2021   Graves disease 07/21/2021   Neuropathy 09/25/2018   Hyperlipidemia 07/19/2017   Hyperglycemia 07/19/2017   Palpitations 05/28/2017   Vertigo 05/28/2017   Adhesive capsulitis of left shoulder 01/30/2017   Hypertension 05/02/2016   Diffuse pain 05/02/2016   Family history of colon  cancer 05/02/2016   Arthritis    Microscopic hematuria 10/16/2011    PCP: Shelva Majestic, MD REFERRING PROVIDER: Andrena Mews, DO   REFERRING DIAG: R42 (ICD-10-CM) - Vertigo   THERAPY DIAG:  Dizziness and giddiness  Unsteadiness on feet  ONSET DATE: 6 weeks ago  Rationale for Evaluation and Treatment: Rehabilitation  SUBJECTIVE:   SUBJECTIVE STATEMENT: Been working on HEP- has not been having many problems with them but has been doing them slowly. Noted dizziness when bending and turning. Reports that she acknowledges that PT progression has been slow and requesting to be pushed a little more d/t "not wanting to be a baby."    Reports this current episode started after getting a manipulation of her upper back at the MD office. Also reports hx of migraines and motion sickness. Asking about vertigo and valium meds.    Pt accompanied by: self  PERTINENT HISTORY: vertigo, Graves disease, HLD, HTN, arthritis, neuropathy, R shoulder surgery (biceps tendinosis, RTC tear) November (has been seeing ortho PT-discharging 09/28/22 per pt report); hx of migraines (with aura)  PAIN:  Are you having pain? Yes: NPRS scale: always there/10 Pain location: neck and shoulders Pain description: tense pain  Aggravating factors: anxiety, tense Relieving factors: DN, ortho PT  PRECAUTIONS: Fall and Other: hx of migraines  RED  FLAGS: None   WEIGHT BEARING RESTRICTIONS: No  FALLS: Has patient fallen in last 6 months? No *Did fall once on steps in May and hit face.  LIVING ENVIRONMENT: Lives with: lives with their family Lives in: House/apartment  PLOF: Independent  PATIENT GOALS: To get rid of this dizziness  OBJECTIVE:      TODAY'S TREATMENT: 10/10/22 Activity Comments  brandt daroff 2x each C/o floating upon sitting up, worse R>L; edu on intended level of sx     HOME EXERCISE PROGRAM Last updated: 10/10/22 Access Code: [email or web address] URL:  [email or web address] Date: 10/10/2022 Prepared by: [facility] - Outpatient  Rehab - [facility]  Exercises - Narrow Stance with Counter Support  - 1 x daily - 7 x weekly - 1 sets - 3 reps - 30 sec hold - Seated Gaze Stabilization with Head Rotation  - 1-2 x daily - 7 x weekly - 1 sets - 3 reps - 30 sec hold - Seated Gaze Stabilization with Head Nod  - 1-2 x daily - 7 x weekly - 1 sets - 3 reps - 30 sec hold - Brandt-Daroff Vestibular Exercise  - 1 x daily - 5 x weekly - 2 sets - 3 reps   PATIENT EDUCATION: Education details: discussion of patient's symptoms and how it relates to migraines and possible cervicogenic dizziness, answered pt's questions, edu on habituation and intended level of sx with activities; HEP update, review/discussion of patient's general exercise/stretching regimen  Person educated: Patient Education method: Explanation, Demonstration, Tactile cues, Verbal cues, and Handouts Education comprehension: verbalized understanding and returned demonstration    ------------------------------------------------------------------  DIAGNOSTIC FINDINGS: NA  COGNITION: Overall cognitive status: Within functional limits for tasks assessed   Cervical ROM:    Active A/PROM (deg) eval  Flexion 16  Extension 16  Right lateral flexion   Left lateral flexion   Right rotation 34  Left rotation 35  (Blank rows = not tested)  TRANSFERS: Assistive device utilized: None  Sit to stand: Complete Independence Stand to sit: Complete Independence  PATIENT SURVEYS:  FOTO Intake 43; Predicted 56  VESTIBULAR ASSESSMENT:  GENERAL OBSERVATION: Appears and reports being tense through shoulders   SYMPTOM BEHAVIOR:  Subjective history: Always have had sea sickness and motion sickness.  Was working with ortho PT on stomach and turned over-immediate seasick feeling and migraine.  Have trouble bending down to pick up things in yard.  Any sharp, quick movements bring  it on.  Hx of migraines since 62 yo.  Non-Vestibular symptoms: neck pain, migraine symptoms, and hx of seasickness and motion sickness  Type of dizziness: Spinning/Vertigo, Unsteady with head/body turns, "Funny feeling in the head", and floating feeling in head  Frequency: depends   Duration: hours  Aggravating factors: Induced by position change: prone to supine and Induced by motion: bending down to the ground, turning body quickly, turning head quickly, and quick turns of head/eyes  Relieving factors: head stationary, dark room, closing eyes, slow movements, and Has meclizine  Progression of symptoms: worse  OCULOMOTOR EXAM:  Ocular Alignment: abnormal and L eye adducted  Ocular ROM: No Limitations and feels like "eyes cross"  Spontaneous Nystagmus: absent  Gaze-Induced Nystagmus: absent  Smooth Pursuits: intact  Saccades: intact  Convergence/Divergence: 9.5 inches    VESTIBULAR - OCULAR REFLEX:   Slow VOR: Comment: rates beginning of floating feeling; horizontal rates as 1/10 Vertical-loses target after 3rd rep  VOR Cancellation: Comment: starts to sweat, bringing on headache 2/10   *Did not proceed with further  testing due to pt's reports of migraines, increased tension through shoulders and neck  Head-Impulse Test: NT  Dynamic Visual Acuity: Not able to be assessed   POSITIONAL TESTING: Other: NT today to due pt tolerance, no spinning/positional vertigo complaints today    M-CTSIB  Condition 1: Firm Surface, EO 30 Sec, Mild Sway  Condition 2: Firm Surface, EC 30 Sec, Mild Sway, more to L  Condition 3: Foam Surface, EO 30 Sec, Mild Sway  Condition 4: Foam Surface, EC 7-15 Sec, Severe Sway forward and L sway      VESTIBULAR TREATMENT:                                                                                                   DATE: 09/28/2022   PATIENT EDUCATION: Education details: PT eval results, POC, initial HEP Person educated: Patient Education method:  Explanation, Demonstration, and Handouts Education comprehension: verbalized understanding, returned demonstration, and needs further education  HOME EXERCISE PROGRAM: Access Code: [email or web address] URL: [email or web address] Date: 09/28/2022 Prepared by: [facility] - Outpatient  Rehab - [facility]  Exercises - Narrow Stance with Counter Support  - 1 x daily - 7 x weekly - 1 sets - 3 reps - 30 sec hold  GOALS: Goals reviewed with patient? Yes  SHORT TERM GOALS: Target date: 10/27/2022  Pt will be independent with HEP for improved dizziness, vertigo, balance. Baseline: Goal status: IN PROGRESS  2.  Pt will perform Condition 4 on MCTSIB for 30 seconds with moderate or less sway. Baseline: 7-15 sec severe sway Goal status: IN PROGRESS  3.  Pt will report at least 50% decrease in vertigo symptoms with daily activities. Baseline:  Goal status: IN PROGRESS  LONG TERM GOALS: Target date: 11/24/2022  Pt will be independent with HEP for improved dizziness, vertigo and balance. Baseline:  Goal status: IN PROGRESS  2.  Pt will perform MCTSIB condition 4 with mild or less sway for improved multi-system sensory balance. Baseline:  Goal status: IN PROGRESS  3.  FGA to be assessed and goal written as appropriate. Baseline:  Goal status: IN PROGRESS  4.  Pt will improve FOTO score to 56 to demo improved vestibular symptoms. Baseline: 43 Goal status: IN PROGRESS   ASSESSMENT:  CLINICAL IMPRESSION: Patient arrived to session with several questions about HEP and treatment of her dizziness. Also requesting increased intensity of activities. After answering patient's questions, worked on introduction of habituation activities. Patient was educated on intended level of symptoms with habituation and clinical reasoning behind exercises. Patient reported understanding of all edu provided and without complaints upon leaving.   OBJECTIVE IMPAIRMENTS: decreased activity tolerance,  decreased balance, dizziness, impaired flexibility, and pain.   ACTIVITY LIMITATIONS: bending, squatting, reach over head, and locomotion level  PARTICIPATION LIMITATIONS: meal prep, cleaning, laundry, driving, shopping, community activity, occupation, and yard work  PERSONAL FACTORS: 3+ comorbidities: PMH above, including hx of migraines, motion sickness  are also affecting patient's functional outcome.   REHAB POTENTIAL: Good  CLINICAL DECISION MAKING: Evolving/moderate complexity  EVALUATION COMPLEXITY: Moderate   PLAN:  PT FREQUENCY: 1-2x/week  PT  DURATION: 8 weeks  PLANNED INTERVENTIONS: Therapeutic exercises, Therapeutic activity, Neuromuscular re-education, Balance training, Gait training, Patient/Family education, Self Care, Vestibular training, Canalith repositioning, and Manual therapy  PLAN FOR NEXT SESSION:  Assess HEP; multi-system sensory balance retraining.   Progress VOR positions for habituation and may need to address motion sensitivity    Anette Guarneri, PT, DPT 10/10/22 12:37 PM  [facility] Re[facility] at [facility] [address] [place], [address] [place], [place] [address] Phone # [phone] Fax # [phone]

## 2022-10-10 ENCOUNTER — Encounter: Payer: Self-pay | Admitting: Physical Therapy

## 2022-10-10 ENCOUNTER — Ambulatory Visit: Payer: Commercial Managed Care - PPO | Admitting: Physical Therapy

## 2022-10-10 DIAGNOSIS — R2681 Unsteadiness on feet: Secondary | ICD-10-CM

## 2022-10-10 DIAGNOSIS — R42 Dizziness and giddiness: Secondary | ICD-10-CM | POA: Diagnosis not present

## 2022-10-12 ENCOUNTER — Ambulatory Visit (INDEPENDENT_AMBULATORY_CARE_PROVIDER_SITE_OTHER): Payer: Commercial Managed Care - PPO | Admitting: Family Medicine

## 2022-10-12 ENCOUNTER — Encounter: Payer: Self-pay | Admitting: Family Medicine

## 2022-10-12 VITALS — BP 132/78 | HR 58 | Temp 97.3°F | Ht 71.0 in | Wt 233.6 lb

## 2022-10-12 DIAGNOSIS — Z1321 Encounter for screening for nutritional disorder: Secondary | ICD-10-CM

## 2022-10-12 DIAGNOSIS — E0500 Thyrotoxicosis with diffuse goiter without thyrotoxic crisis or storm: Secondary | ICD-10-CM | POA: Diagnosis not present

## 2022-10-12 DIAGNOSIS — Z0000 Encounter for general adult medical examination without abnormal findings: Secondary | ICD-10-CM | POA: Diagnosis not present

## 2022-10-12 DIAGNOSIS — I10 Essential (primary) hypertension: Secondary | ICD-10-CM

## 2022-10-12 DIAGNOSIS — Z131 Encounter for screening for diabetes mellitus: Secondary | ICD-10-CM

## 2022-10-12 DIAGNOSIS — R7989 Other specified abnormal findings of blood chemistry: Secondary | ICD-10-CM

## 2022-10-12 DIAGNOSIS — E785 Hyperlipidemia, unspecified: Secondary | ICD-10-CM | POA: Diagnosis not present

## 2022-10-12 DIAGNOSIS — R739 Hyperglycemia, unspecified: Secondary | ICD-10-CM

## 2022-10-12 LAB — CBC WITH DIFFERENTIAL/PLATELET
Basophils Absolute: 0 10*3/uL (ref 0.0–0.1)
Basophils Relative: 0.5 % (ref 0.0–3.0)
Eosinophils Absolute: 0.2 10*3/uL (ref 0.0–0.7)
Eosinophils Relative: 2.3 % (ref 0.0–5.0)
HCT: 42.7 % (ref 36.0–46.0)
Hemoglobin: 14.4 g/dL (ref 12.0–15.0)
Lymphocytes Relative: 26.1 % (ref 12.0–46.0)
Lymphs Abs: 1.7 10*3/uL (ref 0.7–4.0)
MCHC: 33.6 g/dL (ref 30.0–36.0)
MCV: 97.4 fl (ref 78.0–100.0)
Monocytes Absolute: 0.5 10*3/uL (ref 0.1–1.0)
Monocytes Relative: 8.3 % (ref 3.0–12.0)
Neutro Abs: 4.1 10*3/uL (ref 1.4–7.7)
Neutrophils Relative %: 62.8 % (ref 43.0–77.0)
Platelets: 322 10*3/uL (ref 150.0–400.0)
RBC: 4.39 Mil/uL (ref 3.87–5.11)
RDW: 13.2 % (ref 11.5–15.5)
WBC: 6.5 10*3/uL (ref 4.0–10.5)

## 2022-10-12 LAB — COMPREHENSIVE METABOLIC PANEL
ALT: 37 U/L — ABNORMAL HIGH (ref 0–35)
AST: 28 U/L (ref 0–37)
Albumin: 4.3 g/dL (ref 3.5–5.2)
Alkaline Phosphatase: 61 U/L (ref 39–117)
BUN: 14 mg/dL (ref 6–23)
CO2: 29 mEq/L (ref 19–32)
Calcium: 10.2 mg/dL (ref 8.4–10.5)
Chloride: 104 mEq/L (ref 96–112)
Creatinine, Ser: 0.73 mg/dL (ref 0.40–1.20)
GFR: 86.88 mL/min (ref >60.00)
Glucose, Bld: 77 mg/dL (ref 70–99)
Potassium: 3.9 mEq/L (ref 3.5–5.1)
Sodium: 140 mEq/L (ref 135–145)
Total Bilirubin: 0.7 mg/dL (ref 0.2–1.2)
Total Protein: 7.1 g/dL (ref 6.0–8.3)

## 2022-10-12 LAB — HEMOGLOBIN A1C: Hgb A1c MFr Bld: 5.5 % (ref 4.6–6.5)

## 2022-10-12 LAB — T3, FREE: T3, Free: 3.3 pg/mL (ref 2.3–4.2)

## 2022-10-12 LAB — LIPID PANEL
Cholesterol: 238 mg/dL — ABNORMAL HIGH (ref 0–200)
HDL: 74.2 mg/dL (ref >39.00)
LDL Cholesterol: 148 mg/dL — ABNORMAL HIGH (ref 0–99)
NonHDL: 163.98
Total CHOL/HDL Ratio: 3
Triglycerides: 82 mg/dL (ref 0.0–149.0)
VLDL: 16.4 mg/dL (ref 0.0–40.0)

## 2022-10-12 LAB — T4, FREE: Free T4: 0.84 ng/dL (ref 0.60–1.60)

## 2022-10-12 LAB — TSH: TSH: 0.62 u[IU]/mL (ref 0.35–5.50)

## 2022-10-12 LAB — VITAMIN D 25 HYDROXY (VIT D DEFICIENCY, FRACTURES): VITD: 40.41 ng/mL (ref 30.00–100.00)

## 2022-10-12 NOTE — Patient Instructions (Addendum)
Let us know if you get your flu or COVID vaccine this fall.  Please stop by lab before you go If you have mychart- we will send your results within 3 business days of [place] receiving them.  If you do not have mychart- we will call you about results within 5 business days of [place] receiving them.  *please also note that you will see labs on mychart as soon as they post. I will later go in and write notes on them- will say "notes from Dr. Durene Cal"   Recommended follow up: Return in about 6 months (around 04/14/2023) for followup or sooner if needed.Schedule b4 you leave.

## 2022-10-12 NOTE — Progress Notes (Signed)
Phone [phone]   Subjective:  Patient presents today for their annual physical. Chief complaint-noted.   See problem oriented charting- ROS- full  review of systems was completed and negative except for: light sensitivity   The following were reviewed and entered/updated in epic: Past Medical History:  Diagnosis Date   Arthritis    cervical, lumbar, knees, feet. mild hands   Headache(784.0)    Hx: of occasinal Migraine   Heart palpitations    Hx: of   Hypertension    PONV (postoperative nausea and vomiting)    Thyroid disease    Hyperthyroidism-Graves Disease   Patient Active Problem List   Diagnosis Date Noted   Multinodular goiter 02/01/2022    Priority: High   Hyperthyroidism 11/10/2021    Priority: High   Elevated LFTs 11/10/2021    Priority: High   Goiter 11/10/2021    Priority: High   Graves disease 07/21/2021    Priority: High   Hyperlipidemia 07/19/2017    Priority: Medium    Hyperglycemia 07/19/2017    Priority: Medium    Palpitations 05/28/2017    Priority: Medium    Vertigo 05/28/2017    Priority: Medium    Hypertension 05/02/2016    Priority: Medium    Diffuse pain 05/02/2016    Priority: Medium    Arthritis     Priority: Medium    Neuropathy 09/25/2018    Priority: Low   Adhesive capsulitis of left shoulder 01/30/2017    Priority: Low   Family history of colon cancer 05/02/2016    Priority: Low   Microscopic hematuria 10/16/2011    Priority: Low   Past Surgical History:  Procedure Laterality Date   APPENDECTOMY     CESAREAN SECTION  1990   CHOLECYSTECTOMY N/A 02/28/2013   Procedure: LAPAROSCOPIC CHOLECYSTECTOMY;  Surgeon: Axel Filler, MD;  Location: [facility] OR;  Service: General;  Laterality: N/A;   COLONOSCOPY W/ POLYPECTOMY     Hx: of   KNEE SURGERY     Left knee - meniscus tear   LAPAROSCOPIC APPENDECTOMY N/A 08/05/2012   Procedure: APPENDECTOMY LAPAROSCOPIC;  Surgeon: Clovis Pu. Cornett, MD;  Location: [facility];  Service: General;   Laterality: N/A;   wisdom teeth      Family History  Problem Relation Age of Onset   Uterine cancer Mother        presented with bleeding   Hypertension Mother    Pancreatic cancer Father    Cancer - Colon Brother    Stroke Maternal Grandmother         late 36s   Lung cancer Maternal Grandfather    Heart attack Paternal Grandmother        late 6s   Heart attack Paternal Grandfather        died 13    Medications- reviewed and updated Current Outpatient Medications  Medication Sig Dispense Refill   Acetaminophen Extra Strength 500 MG CAPS Take 2 capsules by mouth every 8 (eight) hours.     gabapentin (NEURONTIN) 300 MG capsule Take 300 mg by mouth. 2 pills At night     MAGNESIUM GLUCONATE PO Take 2 tablets by mouth daily.     metoprolol succinate (TOPROL-XL) 50 MG 24 hr tablet Take 1 tablet (50 mg total) by mouth in the morning and at bedtime. Take with or immediately following a meal. 180 tablet 3   PENNSAID 2 % SOLN      tiZANidine (ZANAFLEX) 4 MG tablet Take 4 mg by mouth once.  Vitamin D, Cholecalciferol, 25 MCG (1000 UT) CAPS Take 1 capsule by mouth daily in the afternoon.     diazepam (VALIUM) 5 MG tablet Take 1 tablet (5 mg total) by mouth every 12 (twelve) hours as needed for anxiety (do not drive for 12 hours after taking). (Patient not taking: Reported on 10/12/2022) 11 tablet 0   No current facility-administered medications for this visit.    Allergies-reviewed and updated Allergies  Allergen Reactions   Penicillins Itching    Pt states this may be an error and that she "was given a derivative" of PCN with no reactions at all.    Social History   Social History Narrative   Married 1980. Nicole Campos is husband Marketing executive).3 kids Nicole Campos. Works at [employer] lives with parents, Wandalee Ferdinand single living with parents works at [employer], Psychologist, counselling works at [employer] and in school in [place] living with parents.       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 Oropharynx normal Neck: no thyromegaly CV: RRR no murmurs rubs or gallops Lungs: CTAB no crackles, wheeze, rhonchi Abdomen: soft/nontender/nondistended/normal bowel sounds. No rebound or guarding.  Ext: no edema Skin: warm, dry Neuro: grossly normal, moves all extremities, PERRLA   Assessment and Plan   62 y.o. female presenting for annual physical.  Health Maintenance counseling: 1. Anticipatory guidance: Patient counseled regarding regular dental exams -q6 months, eye exams -yearly,  avoiding smoking and second hand smoke , limiting alcohol to 1 beverage per day- 2 a week or less , no illicit drugs .   2. Risk factor reduction:  Advised patient of need for regular exercise and diet rich and fruits and vegetables to reduce risk of heart attack and stroke.  Exercise- walking daily, swimming on weekends and occasional after work if can make it- wants to walk more but too hot and enjoys outdoor walking most.  Diet/weight management-weight stable within 1 pounds of last year- has tried to reduce caloric intake but has really found weight loss challenging- mentioned healthy weight to wellness- wants to hold off for now .  Does not like to use weights.  Wt Readings from Last 3 Encounters:  10/12/22 233 lb 9.6 oz (106 kg)  08/02/22 234 lb 6.4 oz (106.3 kg)  06/08/22 232 lb (105.2 kg)  3. Immunizations/screenings/ancillary studies-will let us know if she gets a fall flu shot, opts out of COVID-19 vaccination, shingrix declines   Immunization History  Administered Date(s) Administered   Influenza,inj,Quad PF,6+ Mos  05/02/2016, 12/25/2020   Influenza-Unspecified 02/13/2019   Janssen (J&J) SARS-COV-2 Vaccination 06/19/2019   PFIZER(Purple Top)SARS-COV-2 Vaccination 03/17/2020   Tdap 05/02/2016   4. Cervical cancer screening- follows with Dr. Henderson Cloud.  Pap 04/05/22 with 5 year repeat as normal and high risk HPV negative  5. Breast cancer screening-  breast exam with GYN and mammogram -  01/18/21 on file but reports had in 2023- hopefully we get records 6. Colon cancer screening - 01/07/2019 with 5-year follow-up due to brothers history-follows with Dr. Dulce Sellar - we will  try to get records again 7. Skin cancer screening-follows with skin surgery center yearly. advised regular sunscreen use. Denies worrisome, changing, or new skin lesions.  8. Birth control/STD check- monogamous/postmenopausal  9. Osteoporosis screening at 14- has had with GYN in the past and reported reassuring readings  10. Smoking associated screening -never smoker  Status of chronic or acute concerns   #Graves' disease/prior hyperthyroidism S: Presented with palpitations-seen by endocrinology Dr. Lonzo Cloud and started on methimazole 5 mg in the past-now off since July 2023 due to LFT elevations A/P:thankfully has done well off all medicine- will check TSH, t3, t4 at this time  - biotin stopped on Sunday before tests  #hypertension S: medication: metoprolol 50 mg XR--> 75 mg In consult with Dr. Berline Chough to help with overall tension. She takes a 2nd full dose though at lunch Home readings #s: not checking much BP Readings from Last 3 Encounters:  10/12/22 132/78  08/02/22 120/80  06/08/22 120/80  A/P: stable- continue current medicines - basically on twice a day  #hyperlipidemia S: Medication:none Lab Results  Component Value Date   CHOL 241 (H) 10/05/2021   HDL 62.90 10/05/2021   LDLCALC 158 (H) 10/05/2021   TRIG 102.0 10/05/2021   CHOLHDL 4 10/05/2021  The 10-year ASCVD risk score (Arnett DK, et al., 2019) is: 7.4%  A/P: lipids  hopefully stable- 10 year risk not significantly elevated- if risk gets above 7.5% consider CT calcium scoring - would prefer to work on lifestyle  #Idiopathic neuropathy in her feet-history of nerve conduction studies in the past with unclear cause but started after knee surgery. S: Medication: Gabapentin 600mg  at night trial but basically just tolerates A/P: bothersome but tolerable -continue current medications     #Vertigo-years and years of issues.  Therapy 2024 referred by Dr. Berline Chough (not benign paroxysmal positional vertigo (BPPV))- has exercises . Meclizine given and helpful  # Hyperglycemia/insulin resistance/prediabetes- a1c not elevated 2020 though at 5.1 intermittently has CBG elevations -Continue to trend CBGs yearly   #? Lactose intolerance- doesn't do well with lactaid  #vitamin D - takes this long term- check D to make sure adequate and not too high  Recommended follow up: Return in about 6 months (around 04/14/2023) for followup or sooner if needed.Schedule b4 you leave. Future Appointments  Date Time Provider Department Center  10/18/2022  9:30 AM Maryruth Bun, PT [facility]  10/26/2022  2:45 PM Gean Maidens, PT [facility]  06/08/2023  7:50 AM Shamleffer, Konrad Dolores, MD [facility] None   Lab/Order associations: fasting   ICD-10-CM   1. Preventative health care  Z00.00     2. Graves disease  E05.00 TSH    T4, free    T3, free    3. Elevated LFTs  R79.89     4. Primary hypertension  I10     5. Hyperlipidemia, unspecified hyperlipidemia type  E78.5 Comprehensive metabolic panel    CBC with Differential/Platelet    Lipid panel    6. Hyperglycemia  R73.9 Hemoglobin A1c    7. Encounter for vitamin deficiency screening  Z13.21 VITAMIN D 25 Hydroxy (Vit-D Deficiency, Fractures)    8. Screening for diabetes mellitus  Z13.1 Hemoglobin A1c      No orders of the defined types were placed in this encounter.   Return precautions advised.   Tana Conch, MD

## 2022-10-17 NOTE — Therapy (Signed)
OUTPATIENT PHYSICAL THERAPY VESTIBULAR TREATMENT     Patient Name: Nicole Campos MRN: [id number] DOB:Sep 15, 1960, 62 y.o., female Today's Date: 10/18/2022  END OF SESSION:  PT End of Session - 10/18/22 1011     Visit Number 4    Number of Visits 9    Date for PT Re-Evaluation 11/24/22    Authorization Type UHC-60 combined PT/OT/speech-40 used (20 remaining)    Authorization - Visit Number 4    Authorization - Number of Visits 9   of 20 remaining   PT Start Time 0934    PT Stop Time 1012    PT Time Calculation (min) 38 min    Activity Tolerance Patient tolerated treatment well    Behavior During Therapy WFL for tasks assessed/performed;Anxious               Past Medical History:  Diagnosis Date   Arthritis    cervical, lumbar, knees, feet. mild hands   Headache(784.0)    Hx: of occasinal Migraine   Heart palpitations    Hx: of   Hypertension    PONV (postoperative nausea and vomiting)    Thyroid disease    Hyperthyroidism-Graves Disease   Past Surgical History:  Procedure Laterality Date   APPENDECTOMY     CESAREAN SECTION  1990   CHOLECYSTECTOMY N/A 02/28/2013   Procedure: LAPAROSCOPIC CHOLECYSTECTOMY;  Surgeon: Axel Filler, MD;  Location: [facility];  Service: General;  Laterality: N/A;   COLONOSCOPY W/ POLYPECTOMY     Hx: of   KNEE SURGERY     Left knee - meniscus tear   LAPAROSCOPIC APPENDECTOMY N/A 08/05/2012   Procedure: APPENDECTOMY LAPAROSCOPIC;  Surgeon: Clovis Pu. Cornett, MD;  Location: [facility];  Service: General;  Laterality: N/A;   wisdom teeth     Patient Active Problem List   Diagnosis Date Noted   Multinodular goiter 02/01/2022   Hyperthyroidism 11/10/2021   Elevated LFTs 11/10/2021   Goiter 11/10/2021   Graves disease 07/21/2021   Neuropathy 09/25/2018   Hyperlipidemia 07/19/2017   Hyperglycemia 07/19/2017   Palpitations 05/28/2017   Vertigo 05/28/2017   Adhesive capsulitis of left shoulder 01/30/2017   Hypertension 05/02/2016    Diffuse pain 05/02/2016   Family history of colon cancer 05/02/2016   Arthritis    Microscopic hematuria 10/16/2011    PCP: Shelva Majestic, MD REFERRING PROVIDER: Andrena Mews, DO   REFERRING DIAG: R42 (ICD-10-CM) - Vertigo   THERAPY DIAG:  Dizziness and giddiness  Unsteadiness on feet  ONSET DATE: 6 weeks ago  Rationale for Evaluation and Treatment: Rehabilitation  SUBJECTIVE:   SUBJECTIVE STATEMENT: "It's not very good." Reports not too bad with standing exercises, but reports not feeling successful with brandt daroff. Reports that d/t her mobility, she has to perform this activity quickly but this makes her dizzy. Still feeling the effects (1/10) from HEP last night.    Pt accompanied by: self  PERTINENT HISTORY: vertigo, Graves disease, HLD, HTN, arthritis, neuropathy, R shoulder surgery (biceps tendinosis, RTC tear) November (has been seeing ortho PT-discharging 09/28/22 per pt report); hx of migraines (with aura)  PAIN:  Are you having pain? Yes: NPRS scale: always there/10 Pain location: neck and shoulders Pain description: tense pain  Aggravating factors: anxiety, tense Relieving factors: DN, ortho PT  PRECAUTIONS: Fall and Other: hx of migraines  RED FLAGS: None   WEIGHT BEARING RESTRICTIONS: No  FALLS: Has patient fallen in last 6 months? No *Did fall once on steps in May and hit face.  LIVING ENVIRONMENT: Lives with: lives with their family Lives in: House/apartment  PLOF: Independent  PATIENT GOALS: To get rid of this dizziness  OBJECTIVE:    TODAY'S TREATMENT: 10/18/22 Activity Comments  brandt daroff on 2 pillows EO 1x each  R: 2/10 dizziness with slower speed L: 3/10 dizziness with slower speed; Cueing for head position  R/L roll EO 3x each  C/o "lag" on L>R side  Cues to increase pace to tolerance   Sitting VOR horizontal/vertical 30"  Cueing for smoother , quicker, continuous movement; no dizziness   standing VOR  horizontal/vertical 30" C/o some eye fatigue   Standing head turns/nods to targets 30" Reported some neck strain and HA; noted more challenge than VOR    HOME EXERCISE PROGRAM Last updated: 10/18/22 Access Code: [email or web address] URL: [email or web address] Date: 10/18/2022 Prepared by: [facility] - Outpatient  Rehab - [facility]  Exercises - Brandt-Daroff Vestibular Exercise  - 1 x daily - 5 x weekly - 2 sets - 3 reps - Supine to Left Sidelying Vestibular Habituation  - 1 x daily - 5 x weekly - 2 sets - 3-5 reps - Supine to Right Sidelying Vestibular Habituation  - 1 x daily - 5 x weekly - 2 sets - 3-5 reps - Standing with Head Rotation  - 1 x daily - 5 x weekly - 2-3 sets - 30 sec hold - Standing with Head Nod  - 1 x daily - 5 x weekly - 2-3 sets - 30 sec hold  PATIENT EDUCATION: Education details: edu on modifications to HEP and clinical reasoning behind treatments, HEP update, answered pt's questions  Person educated: Patient Education method: Explanation, Demonstration, Tactile cues, Verbal cues, and Handouts Education comprehension: verbalized understanding and returned demonstration    HOME EXERCISE PROGRAM Last updated: 10/10/22 Access Code: [email or web address] URL: [email or web address] Date: 10/10/2022 Prepared by: [facility] - Outpatient  Rehab - [facility]  Exercises - Narrow Stance with Counter Support  - 1 x daily - 7 x weekly - 1 sets - 3 reps - 30 sec hold - Seated Gaze Stabilization with Head Rotation  - 1-2 x daily - 7 x weekly - 1 sets - 3 reps - 30 sec hold - Seated Gaze Stabilization with Head Nod  - 1-2 x daily - 7 x weekly - 1 sets - 3 reps - 30 sec hold - Brandt-Daroff Vestibular Exercise  - 1 x daily - 5 x weekly - 2 sets - 3 reps      ------------------------------------------------------------------  DIAGNOSTIC FINDINGS: NA  COGNITION: Overall cognitive status: Within functional limits for tasks assessed   Cervical ROM:     Active A/PROM (deg) eval  Flexion 16  Extension 16  Right lateral flexion   Left lateral flexion   Right rotation 34  Left rotation 35  (Blank rows = not tested)  TRANSFERS: Assistive device utilized: None  Sit to stand: Complete Independence Stand to sit: Complete Independence  PATIENT SURVEYS:  FOTO Intake 43; Predicted 56  VESTIBULAR ASSESSMENT:  GENERAL OBSERVATION: Appears and reports being tense through shoulders   SYMPTOM BEHAVIOR:  Subjective history: Always have had sea sickness and motion sickness.  Was working with ortho PT on stomach and turned over-immediate seasick feeling and migraine.  Have trouble bending down to pick up things in yard.  Any sharp, quick movements bring it on.  Hx of migraines since 62 yo.  Non-Vestibular symptoms: neck pain, migraine symptoms, and hx of seasickness and motion sickness  Type of dizziness:  Spinning/Vertigo, Unsteady with head/body turns, "Funny feeling in the head", and floating feeling in head  Frequency: depends   Duration: hours  Aggravating factors: Induced by position change: prone to supine and Induced by motion: bending down to the ground, turning body quickly, turning head quickly, and quick turns of head/eyes  Relieving factors: head stationary, dark room, closing eyes, slow movements, and Has meclizine  Progression of symptoms: worse  OCULOMOTOR EXAM:  Ocular Alignment: abnormal and L eye adducted  Ocular ROM: No Limitations and feels like "eyes cross"  Spontaneous Nystagmus: absent  Gaze-Induced Nystagmus: absent  Smooth Pursuits: intact  Saccades: intact  Convergence/Divergence: 9.5 inches    VESTIBULAR - OCULAR REFLEX:   Slow VOR: Comment: rates beginning of floating feeling; horizontal rates as 1/10 Vertical-loses target after 3rd rep  VOR Cancellation: Comment: starts to sweat, bringing on headache 2/10   *Did not proceed with further testing due to pt's reports of migraines, increased tension through  shoulders and neck  Head-Impulse Test: NT  Dynamic Visual Acuity: Not able to be assessed   POSITIONAL TESTING: Other: NT today to due pt tolerance, no spinning/positional vertigo complaints today    M-CTSIB  Condition 1: Firm Surface, EO 30 Sec, Mild Sway  Condition 2: Firm Surface, EC 30 Sec, Mild Sway, more to L  Condition 3: Foam Surface, EO 30 Sec, Mild Sway  Condition 4: Foam Surface, EC 7-15 Sec, Severe Sway forward and L sway      VESTIBULAR TREATMENT:                                                                                                   DATE: 09/28/2022   PATIENT EDUCATION: Education details: PT eval results, POC, initial HEP Person educated: Patient Education method: Explanation, Demonstration, and Handouts Education comprehension: verbalized understanding, returned demonstration, and needs further education  HOME EXERCISE PROGRAM: Access Code: [email or web address] URL: [email or web address] Date: 09/28/2022 Prepared by: [facility] - Outpatient  Rehab - [facility]  Exercises - Narrow Stance with Counter Support  - 1 x daily - 7 x weekly - 1 sets - 3 reps - 30 sec hold  GOALS: Goals reviewed with patient? Yes  SHORT TERM GOALS: Target date: 10/27/2022  Pt will be independent with HEP for improved dizziness, vertigo, balance. Baseline: Goal status: IN PROGRESS  2.  Pt will perform Condition 4 on MCTSIB for 30 seconds with moderate or less sway. Baseline: 7-15 sec severe sway Goal status: IN PROGRESS  3.  Pt will report at least 50% decrease in vertigo symptoms with daily activities. Baseline:  Goal status: IN PROGRESS  LONG TERM GOALS: Target date: 11/24/2022  Pt will be independent with HEP for improved dizziness, vertigo and balance. Baseline:  Goal status: IN PROGRESS  2.  Pt will perform MCTSIB condition 4 with mild or less sway for improved multi-system sensory balance. Baseline:  Goal status: IN PROGRESS  3.  FGA to be  assessed and goal written as appropriate. Baseline:  Goal status: IN PROGRESS  4.  Pt will improve FOTO score to 56 to demo improved vestibular  symptoms. Baseline: 43 Goal status: IN PROGRESS   ASSESSMENT:  CLINICAL IMPRESSION: Patient arrived to session with report of limited tolerance for habituation. Modified this activity with addition of pillow which was much better-tolerated. Progressed habituation with rolling activities with cueing for speed to tolerance, L side continues to be more symptomatic than R. Tolerated VOR activities with cueing for quicker pace and smoother movement very well; more difficulty with larger head amplitude movements. Patient tolerated session well and reported understanding of HEP updates.   OBJECTIVE IMPAIRMENTS: decreased activity tolerance, decreased balance, dizziness, impaired flexibility, and pain.   ACTIVITY LIMITATIONS: bending, squatting, reach over head, and locomotion level  PARTICIPATION LIMITATIONS: meal prep, cleaning, laundry, driving, shopping, community activity, occupation, and yard work  PERSONAL FACTORS: 3+ comorbidities: PMH above, including hx of migraines, motion sickness  are also affecting patient's functional outcome.   REHAB POTENTIAL: Good  CLINICAL DECISION MAKING: Evolving/moderate complexity  EVALUATION COMPLEXITY: Moderate   PLAN:  PT FREQUENCY: 1-2x/week  PT DURATION: 8 weeks  PLANNED INTERVENTIONS: Therapeutic exercises, Therapeutic activity, Neuromuscular re-education, Balance training, Gait training, Patient/Family education, Self Care, Vestibular training, Canalith repositioning, and Manual therapy  PLAN FOR NEXT SESSION:  reassess HEP; multi-system sensory balance retraining.   Progress VOR positions for habituation and may need to address motion sensitivity    Anette Guarneri, PT, DPT 10/18/22 10:12 AM  [facility] Re[facility] at [facility] [address] [place], [address] [place],  [place] [address] Phone # [phone] Fax # [phone]

## 2022-10-18 ENCOUNTER — Encounter: Payer: Self-pay | Admitting: Physical Therapy

## 2022-10-18 ENCOUNTER — Ambulatory Visit: Payer: Commercial Managed Care - PPO | Attending: Sports Medicine | Admitting: Physical Therapy

## 2022-10-18 DIAGNOSIS — R2681 Unsteadiness on feet: Secondary | ICD-10-CM | POA: Insufficient documentation

## 2022-10-18 DIAGNOSIS — R42 Dizziness and giddiness: Secondary | ICD-10-CM | POA: Insufficient documentation

## 2022-10-20 ENCOUNTER — Other Ambulatory Visit: Payer: Self-pay | Admitting: Sports Medicine

## 2022-10-26 ENCOUNTER — Encounter: Payer: Self-pay | Admitting: Physical Therapy

## 2022-10-26 ENCOUNTER — Ambulatory Visit: Payer: Commercial Managed Care - PPO | Admitting: Physical Therapy

## 2022-10-26 DIAGNOSIS — R42 Dizziness and giddiness: Secondary | ICD-10-CM | POA: Diagnosis not present

## 2022-10-26 DIAGNOSIS — R2681 Unsteadiness on feet: Secondary | ICD-10-CM

## 2022-10-26 NOTE — Therapy (Signed)
OUTPATIENT PHYSICAL THERAPY VESTIBULAR TREATMENT     Patient Name: Nicole Campos MRN: [id number] DOB:[birth year], 62 y.o., female Today's Date: 10/27/2022  END OF SESSION:  PT End of Session - 10/26/22 1448     Visit Number 5    Number of Visits 9    Date for PT Re-Evaluation 11/24/22    Authorization Type UHC-60 combined PT/OT/speech-40 used (20 remaining)    Authorization - Visit Number 5    Authorization - Number of Visits 9   of 20 remaining   PT Start Time 1449    PT Stop Time 1530    PT Time Calculation (min) 41 min    Activity Tolerance Patient tolerated treatment well    Behavior During Therapy WFL for tasks assessed/performed;Anxious               Past Medical History:  Diagnosis Date   Arthritis    cervical, lumbar, knees, feet. mild hands   Headache(784.0)    Hx: of occasinal Migraine   Heart palpitations    Hx: of   Hypertension    PONV (postoperative nausea and vomiting)    Thyroid disease    Hyperthyroidism-Graves Disease   Past Surgical History:  Procedure Laterality Date   APPENDECTOMY     CESAREAN SECTION  1990   CHOLECYSTECTOMY N/A 02/28/2013   Procedure: LAPAROSCOPIC CHOLECYSTECTOMY;  Surgeon: Axel Filler, MD;  Location: [facility] OR;  Service: General;  Laterality: N/A;   COLONOSCOPY W/ POLYPECTOMY     Hx: of   KNEE SURGERY     Left knee - meniscus tear   LAPAROSCOPIC APPENDECTOMY N/A 08/05/2012   Procedure: APPENDECTOMY LAPAROSCOPIC;  Surgeon: Clovis Pu. Cornett, MD;  Location: [facility];  Service: General;  Laterality: N/A;   wisdom teeth     Patient Active Problem List   Diagnosis Date Noted   Multinodular goiter 02/01/2022   Hyperthyroidism 11/10/2021   Elevated LFTs 11/10/2021   Goiter 11/10/2021   Graves disease 07/21/2021   Neuropathy 09/25/2018   Hyperlipidemia 07/19/2017   Hyperglycemia 07/19/2017   Palpitations 05/28/2017   Vertigo 05/28/2017   Adhesive capsulitis of left shoulder 01/30/2017   Hypertension 05/02/2016    Diffuse pain 05/02/2016   Family history of colon cancer 05/02/2016   Arthritis    Microscopic hematuria 10/16/2011    PCP: Shelva Majestic, MD REFERRING PROVIDER: Andrena Mews, DO   REFERRING DIAG: R42 (ICD-10-CM) - Vertigo   THERAPY DIAG:  Dizziness and giddiness  Unsteadiness on feet  ONSET DATE: 6 weeks ago  Rationale for Evaluation and Treatment: Rehabilitation  SUBJECTIVE:   SUBJECTIVE STATEMENT: Still have trouble with the Brandt-Daroff, coming up from the position is really hard.  Makes me feel that I have more migraine symptoms after doing the exercise and it bothers my neck and back.  Pt accompanied by: self  PERTINENT HISTORY: vertigo, Graves disease, HLD, HTN, arthritis, neuropathy, R shoulder surgery (biceps tendinosis, RTC tear) November (has been seeing ortho PT-discharging 09/28/22 per pt report); hx of migraines (with aura)  PAIN:  Are you having pain? Yes: NPRS scale: always there/10 Pain location: neck and shoulders Pain description: tense pain  Aggravating factors: anxiety, tense Relieving factors: DN, ortho PT  PRECAUTIONS: Fall and Other: hx of migraines  RED FLAGS: None   WEIGHT BEARING RESTRICTIONS: No  FALLS: Has patient fallen in last 6 months? No *Did fall once on steps in May and hit face.  LIVING ENVIRONMENT: Lives with: lives with their family Lives in: House/apartment  PLOF: Independent  PATIENT GOALS: To get rid of this dizziness  OBJECTIVE:    TODAY'S TREATMENT: 10/26/2022 Activity Comments  Reviewed HEP: Standing with head turns Standing with head nods   No dizziness No dizziness, begins to feel sick more so with head nods   R/L roll EO 3x each  Feels lag coming from L to center, 2-3/10 to L side, no c/o to R; feels cumulative effect with more symptoms  Seated forward bend to upright posture, 3 reps Cues to look ahead at target, feels symptoms-almost nausea, headachey  Standing in corner, Romberg stance EC 2 x 30  sec Min-mod sway  Corner balance with partial tandem stance, EC 2 x 15 sec Mod-severe sway       M-CTSIB  Condition 1: Firm Surface, EO 30 Sec, Normal Sway  Condition 2: Firm Surface, EC 30 Sec, Mild Sway  Condition 3: Foam Surface, EO 30 Sec, Mild Sway  Condition 4: Foam Surface, EC 30 Sec, Moderate Sway    HOME EXERCISE PROGRAM: Access Code: [email or web address] URL: [email or web address] Date: 10/26/2022 Prepared by: [facility] - Outpatient  Rehab - [facility]  Exercises - Supine to Left Sidelying Vestibular Habituation  - 1 x daily - 5 x weekly - 2 sets - 3-5 reps - Supine to Right Sidelying Vestibular Habituation  - 1 x daily - 5 x weekly - 2 sets - 3-5 reps - Standing with Head Rotation  - 1 x daily - 5 x weekly - 2-3 sets - 30 sec hold - Standing with Head Nod  - 1 x daily - 5 x weekly - 2-3 sets - 30 sec hold - Romberg Stance with Eyes Closed  - 1 x daily - 7 x weekly - 1 sets - 3 reps - 30 sec hold  PATIENT EDUCATION: Education details: edu on modifications to HEP (*discussed holding on Brandt-Daroff as it is increasing neck/back pain and causing increased/cumulative symtpoms)  Discussed with head nods/head turns, rolling, and with general supine>sit to use visual target at end ranges to try to decrease intensity and duration of symptoms with motion.  Began discussion about pt's hx of migraines and checking with MD about migraine management, HEP update, answered pt's questions  Person educated: Patient Education method: Explanation, Demonstration, Tactile cues, Verbal cues, and Handouts Education comprehension: verbalized understanding and returned demonstration         ------------------------------------------------------------------  DIAGNOSTIC FINDINGS: NA  COGNITION: Overall cognitive status: Within functional limits for tasks assessed   Cervical ROM:    Active A/PROM (deg) eval  Flexion 16  Extension 16  Right lateral flexion   Left lateral  flexion   Right rotation 34  Left rotation 35  (Blank rows = not tested)  TRANSFERS: Assistive device utilized: None  Sit to stand: Complete Independence Stand to sit: Complete Independence  PATIENT SURVEYS:  FOTO Intake 43; Predicted 56  VESTIBULAR ASSESSMENT:  GENERAL OBSERVATION: Appears and reports being tense through shoulders   SYMPTOM BEHAVIOR:  Subjective history: Always have had sea sickness and motion sickness.  Was working with ortho PT on stomach and turned over-immediate seasick feeling and migraine.  Have trouble bending down to pick up things in yard.  Any sharp, quick movements bring it on.  Hx of migraines since 62 yo.  Non-Vestibular symptoms: neck pain, migraine symptoms, and hx of seasickness and motion sickness  Type of dizziness: Spinning/Vertigo, Unsteady with head/body turns, "Funny feeling in the head", and floating feeling in head  Frequency: depends  Duration: hours  Aggravating factors: Induced by position change: prone to supine and Induced by motion: bending down to the ground, turning body quickly, turning head quickly, and quick turns of head/eyes  Relieving factors: head stationary, dark room, closing eyes, slow movements, and Has meclizine  Progression of symptoms: worse  OCULOMOTOR EXAM:  Ocular Alignment: abnormal and L eye adducted  Ocular ROM: No Limitations and feels like "eyes cross"  Spontaneous Nystagmus: absent  Gaze-Induced Nystagmus: absent  Smooth Pursuits: intact  Saccades: intact  Convergence/Divergence: 9.5 inches    VESTIBULAR - OCULAR REFLEX:   Slow VOR: Comment: rates beginning of floating feeling; horizontal rates as 1/10 Vertical-loses target after 3rd rep  VOR Cancellation: Comment: starts to sweat, bringing on headache 2/10   *Did not proceed with further testing due to pt's reports of migraines, increased tension through shoulders and neck  Head-Impulse Test: NT  Dynamic Visual Acuity: Not able to be  assessed   POSITIONAL TESTING: Other: NT today to due pt tolerance, no spinning/positional vertigo complaints today    M-CTSIB  Condition 1: Firm Surface, EO 30 Sec, Mild Sway  Condition 2: Firm Surface, EC 30 Sec, Mild Sway, more to L  Condition 3: Foam Surface, EO 30 Sec, Mild Sway  Condition 4: Foam Surface, EC 7-15 Sec, Severe Sway forward and L sway      VESTIBULAR TREATMENT:                                                                                                   DATE: 09/28/2022   PATIENT EDUCATION: Education details: PT eval results, POC, initial HEP Person educated: Patient Education method: Explanation, Demonstration, and Handouts Education comprehension: verbalized understanding, returned demonstration, and needs further education  HOME EXERCISE PROGRAM: Access Code: [email or web address] URL: [email or web address] Date: 09/28/2022 Prepared by: [facility] - Outpatient  Rehab - [facility]  Exercises - Narrow Stance with Counter Support  - 1 x daily - 7 x weekly - 1 sets - 3 reps - 30 sec hold  GOALS: Goals reviewed with patient? Yes  SHORT TERM GOALS: Target date: 10/27/2022  Pt will be independent with HEP for improved dizziness, vertigo, balance. Baseline: Goal status: MET 10/26/2022  2.  Pt will perform Condition 4 on MCTSIB for 30 seconds with moderate or less sway. Baseline: 7-15 sec severe sway; 30 sec mod sway 10/26/2022 Goal status: MET 10/26/2022  3.  Pt will report at least 50% decrease in vertigo symptoms with daily activities. Baseline: Reports slight improvement overall Goal status: IN PROGRESS, 10/26/2022  LONG TERM GOALS: Target date: 11/24/2022  Pt will be independent with HEP for improved dizziness, vertigo and balance. Baseline:  Goal status: IN PROGRESS  2.  Pt will perform MCTSIB condition 4 with mild or less sway for improved multi-system sensory balance. Baseline:  Goal status: IN PROGRESS  3.  FGA to be assessed and  goal written as appropriate. Baseline:  Goal status: IN PROGRESS  4.  Pt will improve FOTO score to 56 to demo improved vestibular symptoms. Baseline: 43 Goal status: IN PROGRESS   ASSESSMENT:  CLINICAL IMPRESSION: Assessed STGs this visit, with pt meeting 2 of 3 STGs.  She has met STG 1 and 2; STG 3 not yet met.  While she does continue to report symptoms with head motions and with positional changes, habituation exercises seem to be bringing on a cumulative effect of symptoms and pt reports having migrainous symptoms after PT sessions and HEP performance.  Discussed role of migraines and pt reports they are not currently managed with specific medication by MD; encouraged pt to discuss this with MD, as there may be a migrainous vertigo component with patient.  She has improved on vestibular system use for balance, able to complete 30 sec on Condition 4 of MCTSIB, and progressed with balance exercise/vision removed to continue to work on re-weighting vestibular system.  Encouraged pt to use visual targets at end ranges with habituation exercises to try to lessen symptoms.   OBJECTIVE IMPAIRMENTS: decreased activity tolerance, decreased balance, dizziness, impaired flexibility, and pain.   ACTIVITY LIMITATIONS: bending, squatting, reach over head, and locomotion level  PARTICIPATION LIMITATIONS: meal prep, cleaning, laundry, driving, shopping, community activity, occupation, and yard work  PERSONAL FACTORS: 3+ comorbidities: PMH above, including hx of migraines, motion sickness  are also affecting patient's functional outcome.   REHAB POTENTIAL: Good  CLINICAL DECISION MAKING: Evolving/moderate complexity  EVALUATION COMPLEXITY: Moderate   PLAN:  PT FREQUENCY: 1-2x/week  PT DURATION: 8 weeks  PLANNED INTERVENTIONS: Therapeutic exercises, Therapeutic activity, Neuromuscular re-education, Balance training, Gait training, Patient/Family education, Self Care, Vestibular training,  Canalith repositioning, and Manual therapy  PLAN FOR NEXT SESSION:  Reassess HEP; continue multi-system sensory balance retraining.   Ask how it went with use of visual targets with HEP.  Ask again about migraine management.  Progress VOR positions for habituation and may need to address motion sensitivity    Lonia Blood, PT 10/27/22 11:31 AM Phone: [phone] Fax: [phone]  [facility] Out[facility] Rehab at [facility] [address] [place], [address] [place], [place] [address] Phone # [phone] Fax # [phone]

## 2022-10-31 ENCOUNTER — Ambulatory Visit: Payer: Commercial Managed Care - PPO | Admitting: Physical Therapy

## 2022-10-31 DIAGNOSIS — R42 Dizziness and giddiness: Secondary | ICD-10-CM

## 2022-10-31 DIAGNOSIS — R2681 Unsteadiness on feet: Secondary | ICD-10-CM

## 2022-10-31 NOTE — Therapy (Signed)
OUTPATIENT PHYSICAL THERAPY VESTIBULAR TREATMENT     Patient Name: Nicole Campos MRN: [id number] DOB:05/21/1960, 62 y.o., female Today's Date: 10/31/2022  END OF SESSION:  PT End of Session - 10/31/22 1614     Visit Number 6    Number of Visits 9    Date for PT Re-Evaluation 11/24/22    Authorization Type UHC-60 combined PT/OT/speech-40 used (20 remaining)    Authorization - Visit Number 6    Authorization - Number of Visits 9   of 20 remaining   PT Start Time 1535    PT Stop Time 1614    PT Time Calculation (min) 39 min    Activity Tolerance Patient tolerated treatment well    Behavior During Therapy WFL for tasks assessed/performed;Anxious                Past Medical History:  Diagnosis Date   Arthritis    cervical, lumbar, knees, feet. mild hands   Headache(784.0)    Hx: of occasinal Migraine   Heart palpitations    Hx: of   Hypertension    PONV (postoperative nausea and vomiting)    Thyroid disease    Hyperthyroidism-Graves Disease   Past Surgical History:  Procedure Laterality Date   APPENDECTOMY     CESAREAN SECTION  1990   CHOLECYSTECTOMY N/A 02/28/2013   Procedure: LAPAROSCOPIC CHOLECYSTECTOMY;  Surgeon: Axel Filler, MD;  Location: [facility];  Service: General;  Laterality: N/A;   COLONOSCOPY W/ POLYPECTOMY     Hx: of   KNEE SURGERY     Left knee - meniscus tear   LAPAROSCOPIC APPENDECTOMY N/A 08/05/2012   Procedure: APPENDECTOMY LAPAROSCOPIC;  Surgeon: Clovis Pu. Cornett, MD;  Location: [facility];  Service: General;  Laterality: N/A;   wisdom teeth     Patient Active Problem List   Diagnosis Date Noted   Multinodular goiter 02/01/2022   Hyperthyroidism 11/10/2021   Elevated LFTs 11/10/2021   Goiter 11/10/2021   Graves disease 07/21/2021   Neuropathy 09/25/2018   Hyperlipidemia 07/19/2017   Hyperglycemia 07/19/2017   Palpitations 05/28/2017   Vertigo 05/28/2017   Adhesive capsulitis of left shoulder 01/30/2017   Hypertension 05/02/2016    Diffuse pain 05/02/2016   Family history of colon cancer 05/02/2016   Arthritis    Microscopic hematuria 10/16/2011    PCP: Shelva Majestic, MD REFERRING PROVIDER: Andrena Mews, DO   REFERRING DIAG: R42 (ICD-10-CM) - Vertigo   THERAPY DIAG:  Dizziness and giddiness  Unsteadiness on feet  ONSET DATE: 6 weeks ago  Rationale for Evaluation and Treatment: Rehabilitation  SUBJECTIVE:   SUBJECTIVE STATEMENT: Did get a migraine about an hour ago.  Will power through.  Did the exercises and tried to use the visual target.  Nothing got me really stirred up. Feel more sway when I'm tired.   Pt accompanied by: self  PERTINENT HISTORY: vertigo, Graves disease, HLD, HTN, arthritis, neuropathy, R shoulder surgery (biceps tendinosis, RTC tear) November (has been seeing ortho PT-discharging 09/28/22 per pt report); hx of migraines (with aura)  PAIN:  Are you having pain? Yes: NPRS scale: always there/10 Pain location: neck and shoulders Pain description: tense pain  Aggravating factors: anxiety, tense Relieving factors: DN, ortho PT  PRECAUTIONS: Fall and Other: hx of migraines  RED FLAGS: None   WEIGHT BEARING RESTRICTIONS: No  FALLS: Has patient fallen in last 6 months? No *Did fall once on steps in May and hit face.  LIVING ENVIRONMENT: Lives with: lives with their family  Lives in: House/apartment  PLOF: Independent  PATIENT GOALS: To get rid of this dizziness  OBJECTIVE:    TODAY'S TREATMENT: 10/31/2022 Activity Comments  Reviewed HEP for Romberg stance EC, 2 x 30 sec Mild sway  Feet slightly wider in corner with EC head turns x 5, head nods x 5 Increased headache, no vestibular symptoms, mild sway  Partial heel to toe position with EO/EC 15 sec Mild sway  Forward/back walking 20 ft x 3 reps, min guard Cues for visual target  Forward tandem gait, 15 ft, 6 reps, min guard Cues for visual target; reports she is "breaking into cold sweat"-(nervous); after several  minutes and cold water, no c/o      Discussed use of vision for bathroom visits at night-increased lighting, to avoid quick, non-stop motions for improved balance       HOME EXERCISE PROGRAM: Access Code: [email or web address] URL: [email or web address] Date: 10/26/2022 Prepared by: [facility] - Outpatient  Rehab - [facility]  Exercises - Supine to Left Sidelying Vestibular Habituation  - 1 x daily - 5 x weekly - 2 sets - 3-5 reps - Supine to Right Sidelying Vestibular Habituation  - 1 x daily - 5 x weekly - 2 sets - 3-5 reps - Standing with Head Rotation  - 1 x daily - 5 x weekly - 2-3 sets - 30 sec hold - Standing with Head Nod  - 1 x daily - 5 x weekly - 2-3 sets - 30 sec hold - Romberg Stance with Eyes Closed  - 1 x daily - 7 x weekly - 1 sets - 3 reps - 30 sec hold  -added head turns/head nods EC with feet wide/narrow 10/31/2022  PATIENT EDUCATION: Education details: Progression of HEP; discussed balance/walking at night in low light and use of light to assist with stability and safety Person educated: Patient Education method: Explanation, Demonstration, Tactile cues, Verbal cues, and Handouts Education comprehension: verbalized understanding and returned demonstration         ------------------------------------------------------------------  DIAGNOSTIC FINDINGS: NA  COGNITION: Overall cognitive status: Within functional limits for tasks assessed   Cervical ROM:    Active A/PROM (deg) eval  Flexion 16  Extension 16  Right lateral flexion   Left lateral flexion   Right rotation 34  Left rotation 35  (Blank rows = not tested)  TRANSFERS: Assistive device utilized: None  Sit to stand: Complete Independence Stand to sit: Complete Independence  PATIENT SURVEYS:  FOTO Intake 43; Predicted 56  VESTIBULAR ASSESSMENT:  GENERAL OBSERVATION: Appears and reports being tense through shoulders   SYMPTOM BEHAVIOR:  Subjective history: Always have had  sea sickness and motion sickness.  Was working with ortho PT on stomach and turned over-immediate seasick feeling and migraine.  Have trouble bending down to pick up things in yard.  Any sharp, quick movements bring it on.  Hx of migraines since 62 yo.  Non-Vestibular symptoms: neck pain, migraine symptoms, and hx of seasickness and motion sickness  Type of dizziness: Spinning/Vertigo, Unsteady with head/body turns, "Funny feeling in the head", and floating feeling in head  Frequency: depends   Duration: hours  Aggravating factors: Induced by position change: prone to supine and Induced by motion: bending down to the ground, turning body quickly, turning head quickly, and quick turns of head/eyes  Relieving factors: head stationary, dark room, closing eyes, slow movements, and Has meclizine  Progression of symptoms: worse  OCULOMOTOR EXAM:  Ocular Alignment: abnormal and L eye adducted  Ocular ROM: No  Limitations and feels like "eyes cross"  Spontaneous Nystagmus: absent  Gaze-Induced Nystagmus: absent  Smooth Pursuits: intact  Saccades: intact  Convergence/Divergence: 9.5 inches    VESTIBULAR - OCULAR REFLEX:   Slow VOR: Comment: rates beginning of floating feeling; horizontal rates as 1/10 Vertical-loses target after 3rd rep  VOR Cancellation: Comment: starts to sweat, bringing on headache 2/10   *Did not proceed with further testing due to pt's reports of migraines, increased tension through shoulders and neck  Head-Impulse Test: NT  Dynamic Visual Acuity: Not able to be assessed   POSITIONAL TESTING: Other: NT today to due pt tolerance, no spinning/positional vertigo complaints today    M-CTSIB  Condition 1: Firm Surface, EO 30 Sec, Mild Sway  Condition 2: Firm Surface, EC 30 Sec, Mild Sway, more to L  Condition 3: Foam Surface, EO 30 Sec, Mild Sway  Condition 4: Foam Surface, EC 7-15 Sec, Severe Sway forward and L sway      VESTIBULAR TREATMENT:                                                                                                    DATE: 09/28/2022   PATIENT EDUCATION: Education details: PT eval results, POC, initial HEP Person educated: Patient Education method: Explanation, Demonstration, and Handouts Education comprehension: verbalized understanding, returned demonstration, and needs further education  HOME EXERCISE PROGRAM: Access Code: [email or web address] URL: [email or web address] Date: 09/28/2022 Prepared by: [facility] - Outpatient  Rehab - [facility]  Exercises - Narrow Stance with Counter Support  - 1 x daily - 7 x weekly - 1 sets - 3 reps - 30 sec hold  GOALS: Goals reviewed with patient? Yes  SHORT TERM GOALS: Target date: 10/27/2022  Pt will be independent with HEP for improved dizziness, vertigo, balance. Baseline: Goal status: MET 10/26/2022  2.  Pt will perform Condition 4 on MCTSIB for 30 seconds with moderate or less sway. Baseline: 7-15 sec severe sway; 30 sec mod sway 10/26/2022 Goal status: MET 10/26/2022  3.  Pt will report at least 50% decrease in vertigo symptoms with daily activities. Baseline: Reports slight improvement overall Goal status: IN PROGRESS, 10/26/2022  LONG TERM GOALS: Target date: 11/24/2022  Pt will be independent with HEP for improved dizziness, vertigo and balance. Baseline:  Goal status: IN PROGRESS  2.  Pt will perform MCTSIB condition 4 with mild or less sway for improved multi-system sensory balance. Baseline:  Goal status: IN PROGRESS  3.  FGA to be assessed and goal written as appropriate. Baseline:  Goal status: IN PROGRESS  4.  Pt will improve FOTO score to 56 to demo improved vestibular symptoms. Baseline: 43 Goal status: IN PROGRESS   ASSESSMENT:  CLINICAL IMPRESSION: Pt presents to OPPT today with a migraine that started an hour or so prior to therapy session today.  She reports she would like to continue with therapy session today, and does not report significant  increase in migraine symptoms.  She does seem pleased that she performed HEP since last session with good tolerance to use of visual targets at end ranges.  In today's session, focused on corner balance exercises with vision removed, with overall less sway noted compared to last visit.  Progressed to more dynamic activities (forward/back walking and tandem gait), with pt improving stability with repetition of each activity in session today; she does admit at end of these exercises, she has likely held her breath due to being nervous about performing these.  Overall, pt is tolerating more activities in today's session and seems to be doing well with progression of exercises to bias vestibular system use. She will continue to benefit from skilled PT towards goals for improved functional mobility and decreased dizziness. OBJECTIVE IMPAIRMENTS: decreased activity tolerance, decreased balance, dizziness, impaired flexibility, and pain.   ACTIVITY LIMITATIONS: bending, squatting, reach over head, and locomotion level  PARTICIPATION LIMITATIONS: meal prep, cleaning, laundry, driving, shopping, community activity, occupation, and yard work  PERSONAL FACTORS: 3+ comorbidities: PMH above, including hx of migraines, motion sickness  are also affecting patient's functional outcome.   REHAB POTENTIAL: Good  CLINICAL DECISION MAKING: Evolving/moderate complexity  EVALUATION COMPLEXITY: Moderate   PLAN:  PT FREQUENCY: 1-2x/week  PT DURATION: 8 weeks  PLANNED INTERVENTIONS: Therapeutic exercises, Therapeutic activity, Neuromuscular re-education, Balance training, Gait training, Patient/Family education, Self Care, Vestibular training, Canalith repositioning, and Manual therapy  PLAN FOR NEXT SESSION:  Reassess progression of  HEP; continue multi-system sensory balance retraining, including introduction to more compliant surfaces.   Ask again about migraine management.  Progress VOR positions for habituation  and may need to address motion sensitivity.  Need to assess FGA if we haven't already done this.    Lonia Blood, PT 10/31/22 4:20 PM Phone: [phone] Fax: [phone]  [facility] at [facility] [address] [place], [place] [address] Phone # [phone] Fax # [phone]

## 2022-11-02 ENCOUNTER — Ambulatory Visit: Payer: Commercial Managed Care - PPO | Admitting: Physical Therapy

## 2022-11-07 NOTE — Therapy (Signed)
OUTPATIENT PHYSICAL THERAPY VESTIBULAR TREATMENT     Patient Name: Nicole Campos MRN: [id number] DOB:[birth year], 62 y.o., female Today's Date: 11/08/2022  END OF SESSION:  PT End of Session - 11/08/22 1105     Visit Number 7    Number of Visits 9    Date for PT Re-Evaluation 11/24/22    Authorization Type UHC-60 combined PT/OT/speech-40 used (20 remaining)    Authorization - Visit Number 7    Authorization - Number of Visits 9   of 20 remaining   PT Start Time 1016    PT Stop Time 1055    PT Time Calculation (min) 39 min    Equipment Utilized During Treatment Gait belt    Activity Tolerance Patient tolerated treatment well    Behavior During Therapy WFL for tasks assessed/performed                 Past Medical History:  Diagnosis Date   Arthritis    cervical, lumbar, knees, feet. mild hands   Headache(784.0)    Hx: of occasinal Migraine   Heart palpitations    Hx: of   Hypertension    PONV (postoperative nausea and vomiting)    Thyroid disease    Hyperthyroidism-Graves Disease   Past Surgical History:  Procedure Laterality Date   APPENDECTOMY     CESAREAN SECTION  1990   CHOLECYSTECTOMY N/A 02/28/2013   Procedure: LAPAROSCOPIC CHOLECYSTECTOMY;  Surgeon: Axel Filler, MD;  Location: [facility] OR;  Service: General;  Laterality: N/A;   COLONOSCOPY W/ POLYPECTOMY     Hx: of   KNEE SURGERY     Left knee - meniscus tear   LAPAROSCOPIC APPENDECTOMY N/A 08/05/2012   Procedure: APPENDECTOMY LAPAROSCOPIC;  Surgeon: Clovis Pu. Cornett, MD;  Location: [facility];  Service: General;  Laterality: N/A;   wisdom teeth     Patient Active Problem List   Diagnosis Date Noted   Multinodular goiter 02/01/2022   Hyperthyroidism 11/10/2021   Elevated LFTs 11/10/2021   Goiter 11/10/2021   Graves disease 07/21/2021   Neuropathy 09/25/2018   Hyperlipidemia 07/19/2017   Hyperglycemia 07/19/2017   Palpitations 05/28/2017   Vertigo 05/28/2017   Adhesive capsulitis of left  shoulder 01/30/2017   Hypertension 05/02/2016   Diffuse pain 05/02/2016   Family history of colon cancer 05/02/2016   Arthritis    Microscopic hematuria 10/16/2011    PCP: Shelva Majestic, MD REFERRING PROVIDER: Andrena Mews, DO   REFERRING DIAG: R42 (ICD-10-CM) - Vertigo   THERAPY DIAG:  Dizziness and giddiness  Unsteadiness on feet  ONSET DATE: 6 weeks ago  Rationale for Evaluation and Treatment: Rehabilitation  SUBJECTIVE:   SUBJECTIVE STATEMENT: Been able to do all the exercises. Reports that she kind of had a migraine the last session and had an aura which lasted about 4 days. This is not typical for her. Reports significant worry when going down long slights of stairs.   Pt accompanied by: self  PERTINENT HISTORY: vertigo, Graves disease, HLD, HTN, arthritis, neuropathy, R shoulder surgery (biceps tendinosis, RTC tear) November (has been seeing ortho PT-discharging 09/28/22 per pt report); hx of migraines (with aura)  PAIN:  Are you having pain? Yes: NPRS scale: always there/10 Pain location: neck and shoulders Pain description: tense pain  Aggravating factors: anxiety, tense Relieving factors: DN, ortho PT  PRECAUTIONS: Fall and Other: hx of migraines  RED FLAGS: None   WEIGHT BEARING RESTRICTIONS: No  FALLS: Has patient fallen in last 6 months? No *Did fall  once on steps in May and hit face.  LIVING ENVIRONMENT: Lives with: lives with their family Lives in: House/apartment  PLOF: Independent  PATIENT GOALS: To get rid of this dizziness  OBJECTIVE:     TODAY'S TREATMENT: 11/08/22 Activity Comments  FGA 25/30  romberg and wide stance EC + head turns/nods 30" each C/o some nausea ; more sway and dizziness with romberg positioning. Report of more sensitivity with head nods   standing on foam EO , EC 2x30", head turns/nods 30"  Cues to decrease pace of head movements in order to better-tolerate visual perception of movement   walking EC 2x58ft   Cues to widen stance; L pulsion   backwards walking  2x27ft  Cues to widen stance    PATIENT EDUCATION: Education details: edu on functional relevance of FGA score; progress towards goals Person educated: Patient Education method: Explanation, Demonstration, Tactile cues, and Verbal cues Education comprehension: verbalized understanding and returned demonstration     HOME EXERCISE PROGRAM: Access Code: [email or web address] URL: [email or web address] Date: 10/26/2022 Prepared by: [facility] - Outpatient  Rehab - [facility]  Exercises - Supine to Left Sidelying Vestibular Habituation  - 1 x daily - 5 x weekly - 2 sets - 3-5 reps - Supine to Right Sidelying Vestibular Habituation  - 1 x daily - 5 x weekly - 2 sets - 3-5 reps - Standing with Head Rotation  - 1 x daily - 5 x weekly - 2-3 sets - 30 sec hold - Standing with Head Nod  - 1 x daily - 5 x weekly - 2-3 sets - 30 sec hold - Romberg Stance with Eyes Closed  - 1 x daily - 7 x weekly - 1 sets - 3 reps - 30 sec hold  -added head turns/head nods EC with feet wide/narrow 10/31/2022    ------------------------------------------------------------------  DIAGNOSTIC FINDINGS: NA  COGNITION: Overall cognitive status: Within functional limits for tasks assessed   Cervical ROM:    Active A/PROM (deg) eval  Flexion 16  Extension 16  Right lateral flexion   Left lateral flexion   Right rotation 34  Left rotation 35  (Blank rows = not tested)  TRANSFERS: Assistive device utilized: None  Sit to stand: Complete Independence Stand to sit: Complete Independence  PATIENT SURVEYS:  FOTO Intake 43; Predicted 56  VESTIBULAR ASSESSMENT:  GENERAL OBSERVATION: Appears and reports being tense through shoulders   SYMPTOM BEHAVIOR:  Subjective history: Always have had sea sickness and motion sickness.  Was working with ortho PT on stomach and turned over-immediate seasick feeling and migraine.  Have trouble bending down to  pick up things in yard.  Any sharp, quick movements bring it on.  Hx of migraines since 62 yo.  Non-Vestibular symptoms: neck pain, migraine symptoms, and hx of seasickness and motion sickness  Type of dizziness: Spinning/Vertigo, Unsteady with head/body turns, "Funny feeling in the head", and floating feeling in head  Frequency: depends   Duration: hours  Aggravating factors: Induced by position change: prone to supine and Induced by motion: bending down to the ground, turning body quickly, turning head quickly, and quick turns of head/eyes  Relieving factors: head stationary, dark room, closing eyes, slow movements, and Has meclizine  Progression of symptoms: worse  OCULOMOTOR EXAM:  Ocular Alignment: abnormal and L eye adducted  Ocular ROM: No Limitations and feels like "eyes cross"  Spontaneous Nystagmus: absent  Gaze-Induced Nystagmus: absent  Smooth Pursuits: intact  Saccades: intact  Convergence/Divergence: 9.5 inches    VESTIBULAR -  OCULAR REFLEX:   Slow VOR: Comment: rates beginning of floating feeling; horizontal rates as 1/10 Vertical-loses target after 3rd rep  VOR Cancellation: Comment: starts to sweat, bringing on headache 2/10   *Did not proceed with further testing due to pt's reports of migraines, increased tension through shoulders and neck  Head-Impulse Test: NT  Dynamic Visual Acuity: Not able to be assessed   POSITIONAL TESTING: Other: NT today to due pt tolerance, no spinning/positional vertigo complaints today    M-CTSIB  Condition 1: Firm Surface, EO 30 Sec, Mild Sway  Condition 2: Firm Surface, EC 30 Sec, Mild Sway, more to L  Condition 3: Foam Surface, EO 30 Sec, Mild Sway  Condition 4: Foam Surface, EC 7-15 Sec, Severe Sway forward and L sway      VESTIBULAR TREATMENT:                                                                                                   DATE: 09/28/2022   PATIENT EDUCATION: Education details: PT eval results, POC,  initial HEP Person educated: Patient Education method: Explanation, Demonstration, and Handouts Education comprehension: verbalized understanding, returned demonstration, and needs further education  HOME EXERCISE PROGRAM: Access Code: [email or web address] URL: [email or web address] Date: 09/28/2022 Prepared by: [facility] - Outpatient  Rehab - [facility]  Exercises - Narrow Stance with Counter Support  - 1 x daily - 7 x weekly - 1 sets - 3 reps - 30 sec hold  GOALS: Goals reviewed with patient? Yes  SHORT TERM GOALS: Target date: 10/27/2022  Pt will be independent with HEP for improved dizziness, vertigo, balance. Baseline: Goal status: MET 10/26/2022  2.  Pt will perform Condition 4 on MCTSIB for 30 seconds with moderate or less sway. Baseline: 7-15 sec severe sway; 30 sec mod sway 10/26/2022 Goal status: MET 10/26/2022  3.  Pt will report at least 50% decrease in vertigo symptoms with daily activities. Baseline: Reports slight improvement overall Goal status: IN PROGRESS, 10/26/2022  LONG TERM GOALS: Target date: 11/24/2022  Pt will be independent with HEP for improved dizziness, vertigo and balance. Baseline:  Goal status: IN PROGRESS  2.  Pt will perform MCTSIB condition 4 with mild or less sway for improved multi-system sensory balance. Baseline:  Goal status: IN PROGRESS  3.  Patient to score 28/30 on FGA in order to improve balance confidence.  Baseline: 25/30 11/08/22  Goal status: IN PROGRESS 11/08/22   4.  Pt will improve FOTO score to 56 to demo improved vestibular symptoms. Baseline: 43 Goal status: IN PROGRESS   ASSESSMENT:  CLINICAL IMPRESSION: Patient arrived to session with report of good HEP compliance but noted migraine aura for 4 days after last session. Patient scored 25/30 on FGA, indicating a decreased risk of falls. Multisensory balance activities were performed with [facility] patient noted some nausea and demonstrated slightly more sway  in  narrow stance. Also initiated compliant surface training with mild sway. Did encourage slowed head movement to allow for patient to better-tolerate visual perception of movement. No complaints at end of session.  OBJECTIVE IMPAIRMENTS: decreased activity tolerance,  decreased balance, dizziness, impaired flexibility, and pain.   ACTIVITY LIMITATIONS: bending, squatting, reach over head, and locomotion level  PARTICIPATION LIMITATIONS: meal prep, cleaning, laundry, driving, shopping, community activity, occupation, and yard work  PERSONAL FACTORS: 3+ comorbidities: PMH above, including hx of migraines, motion sickness  are also affecting patient's functional outcome.   REHAB POTENTIAL: Good  CLINICAL DECISION MAKING: Evolving/moderate complexity  EVALUATION COMPLEXITY: Moderate   PLAN:  PT FREQUENCY: 1-2x/week  PT DURATION: 8 weeks  PLANNED INTERVENTIONS: Therapeutic exercises, Therapeutic activity, Neuromuscular re-education, Balance training, Gait training, Patient/Family education, Self Care, Vestibular training, Canalith repositioning, and Manual therapy  PLAN FOR NEXT SESSION:  work on stair training and stair balance exercises without handrail;   Reassess progression of  HEP; continue multi-system sensory balance retraining, including introduction to more compliant surfaces.   Ask again about migraine management.  Progress VOR positions for habituation and may need to address motion sensitivity.      Anette Guarneri, PT, DPT 11/08/22 11:33 AM  [facility] at [facility] [address] [place], [address] [place], [place] [address] Phone # [phone] Fax # [phone]

## 2022-11-08 ENCOUNTER — Ambulatory Visit: Payer: Commercial Managed Care - PPO | Admitting: Physical Therapy

## 2022-11-08 DIAGNOSIS — R42 Dizziness and giddiness: Secondary | ICD-10-CM

## 2022-11-08 DIAGNOSIS — R2681 Unsteadiness on feet: Secondary | ICD-10-CM

## 2022-11-16 ENCOUNTER — Ambulatory Visit: Payer: Commercial Managed Care - PPO | Attending: Sports Medicine | Admitting: Physical Therapy

## 2022-11-16 ENCOUNTER — Encounter: Payer: Self-pay | Admitting: Physical Therapy

## 2022-11-16 ENCOUNTER — Other Ambulatory Visit: Payer: Self-pay | Admitting: Sports Medicine

## 2022-11-16 DIAGNOSIS — R42 Dizziness and giddiness: Secondary | ICD-10-CM | POA: Insufficient documentation

## 2022-11-16 DIAGNOSIS — R2681 Unsteadiness on feet: Secondary | ICD-10-CM | POA: Insufficient documentation

## 2022-11-16 NOTE — Therapy (Signed)
OUTPATIENT PHYSICAL THERAPY VESTIBULAR TREATMENT     Patient Name: Nicole Campos MRN: [id number] DOB:04-10-1960, 62 y.o., female Today's Date: 11/16/2022  END OF SESSION:  PT End of Session - 11/16/22 1617     Visit Number 8    Number of Visits 9    Date for PT Re-Evaluation 11/24/22    Authorization Type [employer]-60 combined PT/OT/speech-40 used (20 remaining)    Authorization - Visit Number 8    Authorization - Number of Visits 9   of 20 remaining   PT Start Time 1619    PT Stop Time 1705    PT Time Calculation (min) 46 min    Equipment Utilized During Treatment --    Activity Tolerance Patient tolerated treatment well    Behavior During Therapy WFL for tasks assessed/performed                  Past Medical History:  Diagnosis Date   Arthritis    cervical, lumbar, knees, feet. mild hands   Headache(784.0)    Hx: of occasinal Migraine   Heart palpitations    Hx: of   Hypertension    PONV (postoperative nausea and vomiting)    Thyroid disease    Hyperthyroidism-Graves Disease   Past Surgical History:  Procedure Laterality Date   APPENDECTOMY     CESAREAN SECTION  1990   CHOLECYSTECTOMY N/A 02/28/2013   Procedure: LAPAROSCOPIC CHOLECYSTECTOMY;  Surgeon: Axel Filler, MD;  Location: [facility];  Service: General;  Laterality: N/A;   COLONOSCOPY W/ POLYPECTOMY     Hx: of   KNEE SURGERY     Left knee - meniscus tear   LAPAROSCOPIC APPENDECTOMY N/A 08/05/2012   Procedure: APPENDECTOMY LAPAROSCOPIC;  Surgeon: Clovis Pu. Cornett, MD;  Location: [facility];  Service: General;  Laterality: N/A;   wisdom teeth     Patient Active Problem List   Diagnosis Date Noted   Multinodular goiter 02/01/2022   Hyperthyroidism 11/10/2021   Elevated LFTs 11/10/2021   Goiter 11/10/2021   Graves disease 07/21/2021   Neuropathy 09/25/2018   Hyperlipidemia 07/19/2017   Hyperglycemia 07/19/2017   Palpitations 05/28/2017   Vertigo 05/28/2017   Adhesive capsulitis of left shoulder  01/30/2017   Hypertension 05/02/2016   Diffuse pain 05/02/2016   Family history of colon cancer 05/02/2016   Arthritis    Microscopic hematuria 10/16/2011    PCP: Shelva Majestic, MD REFERRING PROVIDER: Andrena Mews, DO   REFERRING DIAG: R42 (ICD-10-CM) - Vertigo   THERAPY DIAG:  Dizziness and giddiness  Unsteadiness on feet  ONSET DATE: 6 weeks ago  Rationale for Evaluation and Treatment: Rehabilitation  SUBJECTIVE:   SUBJECTIVE STATEMENT: Did talk to my ortho MD about the increase in migraines recently.  After last visit; had a migraine and aura for 7 days.  MD is planning to send me to Dr. Everlena Cooper about migraines.  Doing the exercises daily and tolerating them well.  Sometimes do have some fear coming down stairs.   Pt accompanied by: self  PERTINENT HISTORY: vertigo, Graves disease, HLD, HTN, arthritis, neuropathy, R shoulder surgery (biceps tendinosis, RTC tear) November (has been seeing ortho PT-discharging 09/28/22 per pt report); hx of migraines (with aura)  PAIN:  Are you having pain? Yes: NPRS scale: always there/10 Pain location: neck and shoulders Pain description: tense pain  Aggravating factors: anxiety, tense Relieving factors: DN, ortho PT  PRECAUTIONS: Fall and Other: hx of migraines  RED FLAGS: None   WEIGHT BEARING RESTRICTIONS: No  FALLS: Has patient fallen in last 6 months? No *Did fall once on steps in May and hit face.  LIVING ENVIRONMENT: Lives with: lives with their family Lives in: House/apartment  PLOF: Independent  PATIENT GOALS: To get rid of this dizziness  OBJECTIVE:   Reports neck extension motions bring on what pt describes as a lag-like head motions, where head is not catching  up to my eyes.  She does report better getting up from rolling/sidelying position.    TODAY'S TREATMENT: 11/16/2022 Activity Comments  Stair negotiation and practice -step through pattern with rail -cues for exaggerated foot clearance to descend  the stairs  Reports increase fear with descending steps at home -pt reports improved with cues  Stagger stance forward/back rock for dynamic stretch after prolonged sitting 5-10 reps each side  Forward step ups x 10 Forward step downs x 10 Light UE support  Seated eye motion WFL vertical VOR vertical WFL Vertical VOR cancellation x 5 reps (c/o nausea)   Motion sensitivity:  diagonals L knee to center, 3 reps c/o 2-3/10 symptoms Diagonals R knee to center, 3 reps (not as bad) Symptoms settle to 1/10  Diagonals  L head motion up then to center, 1-2/10 R up then to center 1-2/10 with nausea       PATIENT EDUCATION: Education details: strategies for improved confidence on stairs; verbally/demo added diagonal head motions up to L, then to center; up to R, then to center (repeat as habituation until symptoms 3/10) Person educated: Patient Education method: Explanation, Demonstration, Tactile cues, and Verbal cues Education comprehension: verbalized understanding and returned demonstration     HOME EXERCISE PROGRAM: Access Code: [email or web address] URL: [email or web address] Date: 11/16/2022 Prepared by: [facility] - Outpatient  Rehab - [facility]  Exercises - Supine to Left Sidelying Vestibular Habituation  - 1 x daily - 5 x weekly - 2 sets - 3-5 reps - Supine to Right Sidelying Vestibular Habituation  - 1 x daily - 5 x weekly - 2 sets - 3-5 reps - Standing with Head Rotation  - 1 x daily - 5 x weekly - 2-3 sets - 30 sec hold - Standing with Head Nod  - 1 x daily - 5 x weekly - 2-3 sets - 30 sec hold - Romberg Stance with Eyes Closed  - 1 x daily - 7 x weekly - 1 sets - 3 reps - 30 sec hold  -added head turns/head nods EC with feet wide/narrow 10/31/2022 -Diagonal head motions up Left to center x 5, up Right to center x 5 (habituation) in sitting    ------------------------------------------------------------------  DIAGNOSTIC FINDINGS: NA  COGNITION: Overall cognitive  status: Within functional limits for tasks assessed   Cervical ROM:    Active A/PROM (deg) eval  Flexion 16  Extension 16  Right lateral flexion   Left lateral flexion   Right rotation 34  Left rotation 35  (Blank rows = not tested)  TRANSFERS: Assistive device utilized: None  Sit to stand: Complete Independence Stand to sit: Complete Independence  PATIENT SURVEYS:  FOTO Intake 43; Predicted 56  VESTIBULAR ASSESSMENT:  GENERAL OBSERVATION: Appears and reports being tense through shoulders   SYMPTOM BEHAVIOR:  Subjective history: Always have had sea sickness and motion sickness.  Was working with ortho PT on stomach and turned over-immediate seasick feeling and migraine.  Have trouble bending down to pick up things in yard.  Any sharp, quick movements bring it on.  Hx of migraines since 62 yo.  Non-Vestibular symptoms:  neck pain, migraine symptoms, and hx of seasickness and motion sickness  Type of dizziness: Spinning/Vertigo, Unsteady with head/body turns, "Funny feeling in the head", and floating feeling in head  Frequency: depends   Duration: hours  Aggravating factors: Induced by position change: prone to supine and Induced by motion: bending down to the ground, turning body quickly, turning head quickly, and quick turns of head/eyes  Relieving factors: head stationary, dark room, closing eyes, slow movements, and Has meclizine  Progression of symptoms: worse  OCULOMOTOR EXAM:  Ocular Alignment: abnormal and L eye adducted  Ocular ROM: No Limitations and feels like "eyes cross"  Spontaneous Nystagmus: absent  Gaze-Induced Nystagmus: absent  Smooth Pursuits: intact  Saccades: intact  Convergence/Divergence: 9.5 inches    VESTIBULAR - OCULAR REFLEX:   Slow VOR: Comment: rates beginning of floating feeling; horizontal rates as 1/10 Vertical-loses target after 3rd rep  VOR Cancellation: Comment: starts to sweat, bringing on headache 2/10   *Did not proceed with further  testing due to pt's reports of migraines, increased tension through shoulders and neck  Head-Impulse Test: NT  Dynamic Visual Acuity: Not able to be assessed   POSITIONAL TESTING: Other: NT today to due pt tolerance, no spinning/positional vertigo complaints today    M-CTSIB  Condition 1: Firm Surface, EO 30 Sec, Mild Sway  Condition 2: Firm Surface, EC 30 Sec, Mild Sway, more to L  Condition 3: Foam Surface, EO 30 Sec, Mild Sway  Condition 4: Foam Surface, EC 7-15 Sec, Severe Sway forward and L sway      VESTIBULAR TREATMENT:                                                                                                   DATE: 09/28/2022   PATIENT EDUCATION: Education details: PT eval results, POC, initial HEP Person educated: Patient Education method: Explanation, Demonstration, and Handouts Education comprehension: verbalized understanding, returned demonstration, and needs further education  HOME EXERCISE PROGRAM: Access Code: [email or web address] URL: [email or web address] Date: 09/28/2022 Prepared by: [facility] - Outpatient  Rehab - [facility]  Exercises - Narrow Stance with Counter Support  - 1 x daily - 7 x weekly - 1 sets - 3 reps - 30 sec hold  GOALS: Goals reviewed with patient? Yes  SHORT TERM GOALS: Target date: 10/27/2022  Pt will be independent with HEP for improved dizziness, vertigo, balance. Baseline: Goal status: MET 10/26/2022  2.  Pt will perform Condition 4 on MCTSIB for 30 seconds with moderate or less sway. Baseline: 7-15 sec severe sway; 30 sec mod sway 10/26/2022 Goal status: MET 10/26/2022  3.  Pt will report at least 50% decrease in vertigo symptoms with daily activities. Baseline: Reports slight improvement overall Goal status: IN PROGRESS, 10/26/2022  LONG TERM GOALS: Target date: 11/24/2022  Pt will be independent with HEP for improved dizziness, vertigo and balance. Baseline:  Goal status: IN PROGRESS  2.  Pt will perform  MCTSIB condition 4 with mild or less sway for improved multi-system sensory balance. Baseline:  Goal status: IN PROGRESS  3.  Patient to score 28/30 on  FGA in order to improve balance confidence.  Baseline: 25/30 11/08/22  Goal status: IN PROGRESS 11/08/22   4.  Pt will improve FOTO score to 56 to demo improved vestibular symptoms. Baseline: 43 Goal status: IN PROGRESS   ASSESSMENT:  CLINICAL IMPRESSION: Skilled PT session today focused on strategies to improve confidence with stair negotiation.  She responds well to weightshifting for dynamic stretch as well as cues for full, exaggerated foot clearance when descending steps.  She does continue to report increase in frequency and longer duration of migraines, and that she is consistently performing her HEP.  She did speak to Dr. Berline Chough and he is making referral to Dr. Everlena Cooper in regards to her migraines.  She does report that quick movements of head into and out of neck extension bring on symptoms of "head lag" and with diagonal head motions today, she notes some nausea with looking up diagonally to R side and looking down diagonally to L side.  For the most part, she reports symptoms settle between reps and by end of session.  OBJECTIVE IMPAIRMENTS: decreased activity tolerance, decreased balance, dizziness, impaired flexibility, and pain.   ACTIVITY LIMITATIONS: bending, squatting, reach over head, and locomotion level  PARTICIPATION LIMITATIONS: meal prep, cleaning, laundry, driving, shopping, community activity, occupation, and yard work  PERSONAL FACTORS: 3+ comorbidities: PMH above, including hx of migraines, motion sickness  are also affecting patient's functional outcome.   REHAB POTENTIAL: Good  CLINICAL DECISION MAKING: Evolving/moderate complexity  EVALUATION COMPLEXITY: Moderate   PLAN:  PT FREQUENCY: 1-2x/week  PT DURATION: 8 weeks  PLANNED INTERVENTIONS: Therapeutic exercises, Therapeutic activity, Neuromuscular  re-education, Balance training, Gait training, Patient/Family education, Self Care, Vestibular training, Canalith repositioning, and Manual therapy  PLAN FOR NEXT SESSION:  Review work on stair training and stair balance exercises .  Did pt hear about Dr. Everlena Cooper appointment?  Check LTGs and discuss POC.  May consider putting patient on hold until she sees Dr. Augustine Radar, PT 11/16/22 5:54 PM Phone: [phone] Fax: [phone]  [facility] [address] [place], [address] [place], [place] [address] Phone # [phone] Fax # [phone]

## 2022-11-20 ENCOUNTER — Ambulatory Visit: Payer: Commercial Managed Care - PPO | Admitting: Physical Therapy

## 2022-11-21 NOTE — Therapy (Signed)
OUTPATIENT PHYSICAL THERAPY VESTIBULAR TREATMENT     Patient Name: Nicole Campos MRN: [id number] DOB:[birth year], 62 y.o., female Today's Date: 11/21/2022  END OF SESSION:         Past Medical History:  Diagnosis Date   Arthritis    cervical, lumbar, knees, feet. mild hands   Headache(784.0)    Hx: of occasinal Migraine   Heart palpitations    Hx: of   Hypertension    PONV (postoperative nausea and vomiting)    Thyroid disease    Hyperthyroidism-Graves Disease   Past Surgical History:  Procedure Laterality Date   APPENDECTOMY     CESAREAN SECTION  1990   CHOLECYSTECTOMY N/A 02/28/2013   Procedure: LAPAROSCOPIC CHOLECYSTECTOMY;  Surgeon: Axel Filler, MD;  Location: [facility] OR;  Service: General;  Laterality: N/A;   COLONOSCOPY W/ POLYPECTOMY     Hx: of   KNEE SURGERY     Left knee - meniscus tear   LAPAROSCOPIC APPENDECTOMY N/A 08/05/2012   Procedure: APPENDECTOMY LAPAROSCOPIC;  Surgeon: Clovis Pu. Cornett, MD;  Location: [facility];  Service: General;  Laterality: N/A;   wisdom teeth     Patient Active Problem List   Diagnosis Date Noted   Multinodular goiter 02/01/2022   Hyperthyroidism 11/10/2021   Elevated LFTs 11/10/2021   Goiter 11/10/2021   Graves disease 07/21/2021   Neuropathy 09/25/2018   Hyperlipidemia 07/19/2017   Hyperglycemia 07/19/2017   Palpitations 05/28/2017   Vertigo 05/28/2017   Adhesive capsulitis of left shoulder 01/30/2017   Hypertension 05/02/2016   Diffuse pain 05/02/2016   Family history of colon cancer 05/02/2016   Arthritis    Microscopic hematuria 10/16/2011    PCP: Shelva Majestic, MD REFERRING PROVIDER: Andrena Mews, DO   REFERRING DIAG: R42 (ICD-10-CM) - Vertigo   THERAPY DIAG:  No diagnosis found.  ONSET DATE: 6 weeks ago  Rationale for Evaluation and Treatment: Rehabilitation  SUBJECTIVE:   SUBJECTIVE STATEMENT: Did talk to my ortho MD about the increase in migraines recently.  After last visit; had a  migraine and aura for 7 days.  MD is planning to send me to Dr. Everlena Cooper about migraines.  Doing the exercises daily and tolerating them well.  Sometimes do have some fear coming down stairs.   Pt accompanied by: self  PERTINENT HISTORY: vertigo, Graves disease, HLD, HTN, arthritis, neuropathy, R shoulder surgery (biceps tendinosis, RTC tear) November (has been seeing ortho PT-discharging 09/28/22 per pt report); hx of migraines (with aura)  PAIN:  Are you having pain? Yes: NPRS scale: always there/10 Pain location: neck and shoulders Pain description: tense pain  Aggravating factors: anxiety, tense Relieving factors: DN, ortho PT  PRECAUTIONS: Fall and Other: hx of migraines  RED FLAGS: None   WEIGHT BEARING RESTRICTIONS: No  FALLS: Has patient fallen in last 6 months? No *Did fall once on steps in May and hit face.  LIVING ENVIRONMENT: Lives with: lives with their family Lives in: House/apartment  PLOF: Independent  PATIENT GOALS: To get rid of this dizziness  OBJECTIVE:     TODAY'S TREATMENT: 11/22/22 Activity Comments                       Reports neck extension motions bring on what pt describes as a lag-like head motions, where head is not catching  up to my eyes.  She does report better getting up from rolling/sidelying position.    TODAY'S TREATMENT: 11/16/2022 Activity Comments  Stair negotiation and practice -step through  pattern with rail -cues for exaggerated foot clearance to descend the stairs  Reports increase fear with descending steps at home -pt reports improved with cues  Stagger stance forward/back rock for dynamic stretch after prolonged sitting 5-10 reps each side  Forward step ups x 10 Forward step downs x 10 Light UE support  Seated eye motion WFL vertical VOR vertical WFL Vertical VOR cancellation x 5 reps (c/o nausea)   Motion sensitivity:  diagonals L knee to center, 3 reps c/o 2-3/10 symptoms Diagonals R knee to center, 3 reps (not as  bad) Symptoms settle to 1/10  Diagonals  L head motion up then to center, 1-2/10 R up then to center 1-2/10 with nausea         HOME EXERCISE PROGRAM: Access Code: [email or web address] URL: [email or web address] Date: 11/16/2022 Prepared by: [facility] - Outpatient  Rehab - [facility]  Exercises - Supine to Left Sidelying Vestibular Habituation  - 1 x daily - 5 x weekly - 2 sets - 3-5 reps - Supine to Right Sidelying Vestibular Habituation  - 1 x daily - 5 x weekly - 2 sets - 3-5 reps - Standing with Head Rotation  - 1 x daily - 5 x weekly - 2-3 sets - 30 sec hold - Standing with Head Nod  - 1 x daily - 5 x weekly - 2-3 sets - 30 sec hold - Romberg Stance with Eyes Closed  - 1 x daily - 7 x weekly - 1 sets - 3 reps - 30 sec hold  -added head turns/head nods EC with feet wide/narrow 10/31/2022 -Diagonal head motions up Left to center x 5, up Right to center x 5 (habituation) in sitting    ------------------------------------------------------------------  DIAGNOSTIC FINDINGS: NA  COGNITION: Overall cognitive status: Within functional limits for tasks assessed   Cervical ROM:    Active A/PROM (deg) eval  Flexion 16  Extension 16  Right lateral flexion   Left lateral flexion   Right rotation 34  Left rotation 35  (Blank rows = not tested)  TRANSFERS: Assistive device utilized: None  Sit to stand: Complete Independence Stand to sit: Complete Independence  PATIENT SURVEYS:  FOTO Intake 43; Predicted 56  VESTIBULAR ASSESSMENT:  GENERAL OBSERVATION: Appears and reports being tense through shoulders   SYMPTOM BEHAVIOR:  Subjective history: Always have had sea sickness and motion sickness.  Was working with ortho PT on stomach and turned over-immediate seasick feeling and migraine.  Have trouble bending down to pick up things in yard.  Any sharp, quick movements bring it on.  Hx of migraines since 62 yo.  Non-Vestibular symptoms: neck pain, migraine symptoms, and  hx of seasickness and motion sickness  Type of dizziness: Spinning/Vertigo, Unsteady with head/body turns, "Funny feeling in the head", and floating feeling in head  Frequency: depends   Duration: hours  Aggravating factors: Induced by position change: prone to supine and Induced by motion: bending down to the ground, turning body quickly, turning head quickly, and quick turns of head/eyes  Relieving factors: head stationary, dark room, closing eyes, slow movements, and Has meclizine  Progression of symptoms: worse  OCULOMOTOR EXAM:  Ocular Alignment: abnormal and L eye adducted  Ocular ROM: No Limitations and feels like "eyes cross"  Spontaneous Nystagmus: absent  Gaze-Induced Nystagmus: absent  Smooth Pursuits: intact  Saccades: intact  Convergence/Divergence: 9.5 inches    VESTIBULAR - OCULAR REFLEX:   Slow VOR: Comment: rates beginning of floating feeling; horizontal rates as 1/10 Vertical-loses target after  3rd rep  VOR Cancellation: Comment: starts to sweat, bringing on headache 2/10   *Did not proceed with further testing due to pt's reports of migraines, increased tension through shoulders and neck  Head-Impulse Test: NT  Dynamic Visual Acuity: Not able to be assessed   POSITIONAL TESTING: Other: NT today to due pt tolerance, no spinning/positional vertigo complaints today    M-CTSIB  Condition 1: Firm Surface, EO 30 Sec, Mild Sway  Condition 2: Firm Surface, EC 30 Sec, Mild Sway, more to L  Condition 3: Foam Surface, EO 30 Sec, Mild Sway  Condition 4: Foam Surface, EC 7-15 Sec, Severe Sway forward and L sway      VESTIBULAR TREATMENT:                                                                                                   DATE: 09/28/2022   PATIENT EDUCATION: Education details: PT eval results, POC, initial HEP Person educated: Patient Education method: Explanation, Demonstration, and Handouts Education comprehension: verbalized understanding, returned  demonstration, and needs further education  HOME EXERCISE PROGRAM: Access Code: [email or web address] URL: [email or web address] Date: 09/28/2022 Prepared by: [facility] - Outpatient  Rehab - [facility]  Exercises - Narrow Stance with Counter Support  - 1 x daily - 7 x weekly - 1 sets - 3 reps - 30 sec hold  GOALS: Goals reviewed with patient? Yes  SHORT TERM GOALS: Target date: 10/27/2022  Pt will be independent with HEP for improved dizziness, vertigo, balance. Baseline: Goal status: MET 10/26/2022  2.  Pt will perform Condition 4 on MCTSIB for 30 seconds with moderate or less sway. Baseline: 7-15 sec severe sway; 30 sec mod sway 10/26/2022 Goal status: MET 10/26/2022  3.  Pt will report at least 50% decrease in vertigo symptoms with daily activities. Baseline: Reports slight improvement overall Goal status: IN PROGRESS, 10/26/2022  LONG TERM GOALS: Target date: 11/24/2022  Pt will be independent with HEP for improved dizziness, vertigo and balance. Baseline:  Goal status: IN PROGRESS  2.  Pt will perform MCTSIB condition 4 with mild or less sway for improved multi-system sensory balance. Baseline:  Goal status: IN PROGRESS  3.  Patient to score 28/30 on FGA in order to improve balance confidence.  Baseline: 25/30 11/08/22  Goal status: IN PROGRESS 11/08/22   4.  Pt will improve FOTO score to 56 to demo improved vestibular symptoms. Baseline: 43 Goal status: IN PROGRESS   ASSESSMENT:  CLINICAL IMPRESSION: Skilled PT session today focused on strategies to improve confidence with stair negotiation.  She responds well to weightshifting for dynamic stretch as well as cues for full, exaggerated foot clearance when descending steps.  She does continue to report increase in frequency and longer duration of migraines, and that she is consistently performing her HEP.  She did speak to Dr. Berline Chough and he is making referral to Dr. Everlena Cooper in regards to her migraines.  She does  report that quick movements of head into and out of neck extension bring on symptoms of "head lag" and with diagonal head motions today, she notes  some nausea with looking up diagonally to R side and looking down diagonally to L side.  For the most part, she reports symptoms settle between reps and by end of session.  OBJECTIVE IMPAIRMENTS: decreased activity tolerance, decreased balance, dizziness, impaired flexibility, and pain.   ACTIVITY LIMITATIONS: bending, squatting, reach over head, and locomotion level  PARTICIPATION LIMITATIONS: meal prep, cleaning, laundry, driving, shopping, community activity, occupation, and yard work  PERSONAL FACTORS: 3+ comorbidities: PMH above, including hx of migraines, motion sickness  are also affecting patient's functional outcome.   REHAB POTENTIAL: Good  CLINICAL DECISION MAKING: Evolving/moderate complexity  EVALUATION COMPLEXITY: Moderate   PLAN:  PT FREQUENCY: 1-2x/week  PT DURATION: 8 weeks  PLANNED INTERVENTIONS: Therapeutic exercises, Therapeutic activity, Neuromuscular re-education, Balance training, Gait training, Patient/Family education, Self Care, Vestibular training, Canalith repositioning, and Manual therapy  PLAN FOR NEXT SESSION:  Review work on stair training and stair balance exercises .  Did pt hear about Dr. Everlena Cooper appointment?  Check LTGs and discuss POC.  May consider putting patient on hold until she sees Dr. Everlena Cooper

## 2022-11-22 ENCOUNTER — Ambulatory Visit: Payer: Commercial Managed Care - PPO | Admitting: Physical Therapy

## 2022-11-22 ENCOUNTER — Encounter: Payer: Self-pay | Admitting: Physical Therapy

## 2022-11-22 DIAGNOSIS — R42 Dizziness and giddiness: Secondary | ICD-10-CM | POA: Diagnosis not present

## 2022-11-22 DIAGNOSIS — R2681 Unsteadiness on feet: Secondary | ICD-10-CM

## 2022-12-28 ENCOUNTER — Other Ambulatory Visit: Payer: Self-pay | Admitting: Sports Medicine

## 2023-01-26 ENCOUNTER — Other Ambulatory Visit: Payer: Self-pay | Admitting: Sports Medicine

## 2023-01-26 DIAGNOSIS — M542 Cervicalgia: Secondary | ICD-10-CM

## 2023-01-26 DIAGNOSIS — G629 Polyneuropathy, unspecified: Secondary | ICD-10-CM

## 2023-02-05 ENCOUNTER — Encounter: Payer: Self-pay | Admitting: Family Medicine

## 2023-02-20 ENCOUNTER — Ambulatory Visit
Admission: RE | Admit: 2023-02-20 | Discharge: 2023-02-20 | Disposition: A | Discharge status: Home / Self Care | Payer: Commercial Managed Care - PPO | Source: Ambulatory Visit | Attending: Sports Medicine | Admitting: Sports Medicine

## 2023-02-20 ENCOUNTER — Inpatient Hospital Stay
Admission: RE | Admit: 2023-02-20 | Discharge: 2023-02-20 | Discharge status: Home / Self Care | Payer: Commercial Managed Care - PPO | Source: Ambulatory Visit | Attending: Sports Medicine | Admitting: Sports Medicine

## 2023-02-20 DIAGNOSIS — G629 Polyneuropathy, unspecified: Secondary | ICD-10-CM

## 2023-02-20 DIAGNOSIS — M542 Cervicalgia: Secondary | ICD-10-CM

## 2023-02-20 MED ORDER — GADOPICLENOL 0.5 MMOL/ML IV SOLN
10.0000 mL | Freq: Once | INTRAVENOUS | Status: AC | PRN
  Administered 2023-02-20: 10 mL via INTRAVENOUS

## 2023-02-26 ENCOUNTER — Other Ambulatory Visit: Payer: Self-pay | Admitting: Sports Medicine

## 2023-03-13 ENCOUNTER — Encounter: Payer: Self-pay | Admitting: Family Medicine

## 2023-03-13 ENCOUNTER — Ambulatory Visit: Payer: Commercial Managed Care - PPO | Admitting: Family Medicine

## 2023-03-13 ENCOUNTER — Encounter (INDEPENDENT_AMBULATORY_CARE_PROVIDER_SITE_OTHER): Payer: Self-pay | Admitting: Otolaryngology

## 2023-03-13 VITALS — BP 132/76 | HR 62 | Temp 97.4°F | Ht 71.0 in | Wt 226.6 lb

## 2023-03-13 DIAGNOSIS — G629 Polyneuropathy, unspecified: Secondary | ICD-10-CM | POA: Diagnosis not present

## 2023-03-13 DIAGNOSIS — I10 Essential (primary) hypertension: Secondary | ICD-10-CM

## 2023-03-13 DIAGNOSIS — R1033 Periumbilical pain: Secondary | ICD-10-CM

## 2023-03-13 DIAGNOSIS — E0500 Thyrotoxicosis with diffuse goiter without thyrotoxic crisis or storm: Secondary | ICD-10-CM | POA: Diagnosis not present

## 2023-03-13 DIAGNOSIS — H9313 Tinnitus, bilateral: Secondary | ICD-10-CM | POA: Diagnosis not present

## 2023-03-13 DIAGNOSIS — E0590 Thyrotoxicosis, unspecified without thyrotoxic crisis or storm: Secondary | ICD-10-CM | POA: Diagnosis not present

## 2023-03-13 DIAGNOSIS — K5900 Constipation, unspecified: Secondary | ICD-10-CM

## 2023-03-13 LAB — CBC WITH DIFFERENTIAL/PLATELET
Basophils Absolute: 0 10*3/uL (ref 0.0–0.1)
Basophils Relative: 0.3 % (ref 0.0–3.0)
Eosinophils Absolute: 0.1 10*3/uL (ref 0.0–0.7)
Eosinophils Relative: 1.7 % (ref 0.0–5.0)
HCT: 42.4 % (ref 36.0–46.0)
Hemoglobin: 14.2 g/dL (ref 12.0–15.0)
Lymphocytes Relative: 23.9 % (ref 12.0–46.0)
Lymphs Abs: 1.5 10*3/uL (ref 0.7–4.0)
MCHC: 33.6 g/dL (ref 30.0–36.0)
MCV: 98.5 fL (ref 78.0–100.0)
Monocytes Absolute: 0.5 10*3/uL (ref 0.1–1.0)
Monocytes Relative: 8.9 % (ref 3.0–12.0)
Neutro Abs: 4 10*3/uL (ref 1.4–7.7)
Neutrophils Relative %: 65.2 % (ref 43.0–77.0)
Platelets: 344 10*3/uL (ref 150.0–400.0)
RBC: 4.3 Mil/uL (ref 3.87–5.11)
RDW: 13.1 % (ref 11.5–15.5)
WBC: 6.1 10*3/uL (ref 4.0–10.5)

## 2023-03-13 LAB — COMPREHENSIVE METABOLIC PANEL
ALT: 18 U/L (ref 0–35)
AST: 17 U/L (ref 0–37)
Albumin: 4.3 g/dL (ref 3.5–5.2)
Alkaline Phosphatase: 60 U/L (ref 39–117)
BUN: 16 mg/dL (ref 6–23)
CO2: 28 meq/L (ref 19–32)
Calcium: 10.1 mg/dL (ref 8.4–10.5)
Chloride: 105 meq/L (ref 96–112)
Creatinine, Ser: 0.73 mg/dL (ref 0.40–1.20)
GFR: 86.63 mL/min (ref >60.00)
Glucose, Bld: 87 mg/dL (ref 70–99)
Potassium: 4.8 meq/L (ref 3.5–5.1)
Sodium: 141 meq/L (ref 135–145)
Total Bilirubin: 0.7 mg/dL (ref 0.2–1.2)
Total Protein: 6.6 g/dL (ref 6.0–8.3)

## 2023-03-13 LAB — T3, FREE: T3, Free: 3.3 pg/mL (ref 2.3–4.2)

## 2023-03-13 LAB — T4, FREE: Free T4: 0.78 ng/dL (ref 0.60–1.60)

## 2023-03-13 LAB — VITAMIN B12: Vitamin B-12: 372 pg/mL (ref 211–911)

## 2023-03-13 LAB — TSH: TSH: 0.85 u[IU]/mL (ref 0.35–5.50)

## 2023-03-13 MED ORDER — NYSTATIN 100000 UNIT/ML MT SUSP: 200000.0000 [IU] | Freq: Four times a day (QID) | OROMUCOSAL | 0 refills | Status: DC

## 2023-03-13 NOTE — Patient Instructions (Addendum)
 We are going to try to get records from Dr. Marquette  For abdominal pain- update CBC and CMP and encourage you to schedule follow up with Dr. Burnette  We have placed a referral for you today to ENT given ringing in ears and vertigo. In some cases you will see # listed below- you can call this if you have not heard within a week. If you do not see # listed- you should receive a mychart message or phone call within a week with the # to call directly- call that as soon as you get it. If you are having issues getting scheduled reach out to us  again.   Nystatin  swish and spit to see if mild case of thrush causing dryness after prednisone  Please stop by lab before you go- see if we can find contributing issues for neuropath If you have mychart- we will send your results within 3 business days of us  receiving them.  If you do not have mychart- we will call you about results within 5 business days of us  receiving them.  *please also note that you will see labs on mychart as soon as they post. I will later go in and write notes on them- will say notes from Dr. Katrinka   Recommended follow up: Return for next already scheduled visit or sooner if needed.

## 2023-03-13 NOTE — Progress Notes (Signed)
 Phone [phone] In person visit   Subjective:   Nicole Campos is a 62 y.o. year old very pleasant female patient who presents for/with See problem oriented charting Chief Complaint  Patient presents with   mouth issues    Medical Management of Chronic Issues    Mouth dryness-feels like something is in mouth since taking prednisone, Constipation still ongoing issue, loud ringing in ears, Difficulty walking painful on land not in pool recent issue on Christmas AM, numbness in both feet feels like snug socks on with occasional burning sensation, eye burning with some streaks of light, Vertigo in treatment for it   Numbness    Past Medical History-  Patient Active Problem List   Diagnosis Date Noted   Multinodular goiter 02/01/2022    Priority: High   Hyperthyroidism 11/10/2021    Priority: High   Elevated LFTs 11/10/2021    Priority: High   Goiter 11/10/2021    Priority: High   Graves disease 07/21/2021    Priority: High   Hyperlipidemia 07/19/2017    Priority: Medium    Hyperglycemia 07/19/2017    Priority: Medium    Palpitations 05/28/2017    Priority: Medium    Vertigo 05/28/2017    Priority: Medium    Hypertension 05/02/2016    Priority: Medium    Diffuse pain 05/02/2016    Priority: Medium    Arthritis     Priority: Medium    Neuropathy 09/25/2018    Priority: Low   Adhesive capsulitis of left shoulder 01/30/2017    Priority: Low   Family history of colon cancer 05/02/2016    Priority: Low   Microscopic hematuria 10/16/2011    Priority: Low    Medications- reviewed and updated Current Outpatient Medications  Medication Sig Dispense Refill   Acetaminophen  Extra Strength 500 MG CAPS Take 2 capsules by mouth every 8 (eight) hours.     diazepam  (VALIUM ) 5 MG tablet Take 1 tablet (5 mg total) by mouth every 12 (twelve) hours as needed for anxiety (do not drive for 12 hours after taking). 11 tablet 0   gabapentin (NEURONTIN) 300 MG capsule Take 600 mg  by mouth at bedtime. Take 2 tablets by mouth daily at bedtime     metoprolol  succinate (TOPROL -XL) 50 MG 24 hr tablet Take 1 tablet (50 mg total) by mouth in the morning and at bedtime. Take with or immediately following a meal. 180 tablet 3   PENNSAID 2 % SOLN Apply 2 Pump topically as needed.     tiZANidine (ZANAFLEX) 4 MG tablet Take 4 mg by mouth once.     traMADol (ULTRAM) 50 MG tablet Take 50 mg by mouth every 6 (six) hours as needed for severe pain (pain score 7-10).     MAGNESIUM GLUCONATE PO Take 2 tablets by mouth daily. (Patient not taking: Reported on 03/13/2023)     Vitamin D , Cholecalciferol, 25 MCG (1000 UT) CAPS Take 1 capsule by mouth daily in the afternoon. (Patient not taking: Reported on 03/13/2023)     No current facility-administered medications for this visit.     Objective:  BP 132/76 (BP Location: Left Arm, Patient Position: Sitting)   Pulse 62   Temp (!) 97.4 F (36.3 C) (Temporal)   Ht 5' 11 (1.803 m)   Wt 226 lb 9.6 oz (102.8 kg)   SpO2 96%   BMI 31.60 kg/m  Gen: NAD, resting comfortably Mild dry tongue - mildly white CV: RRR no murmurs rubs or gallops Lungs: CTAB  no crackles, wheeze, rhonchi Ext: no edema Skin: warm, dry    Assessment and Plan   # Numbness/tingling (neuropathy with prior nerve conduction studies S:has been working with Dr. Marquette on this. Numbness in both feet like has snug socks on with some burning at times on top of feet which is newer- issues with neuropathy long term but now on top of feet and into ankles.  Feels better with pool walking but then getting out of pool difficult to walk- feels stiff and in pain. Tries to walk 20 minutes daily -Dr. Marquette was concerned about possible MS with these issues plus concerns about headache, ataxia, stiffness, trouble walking along with vision issues- had MR brain and cervical spine 02/20/23- brain was normal- did have vascular lesion in bone- possible osseous hemangioma with plan for 3-6 month  follow up  -still on gabapentin 600 mg at night A/P: long term neuropathy this may represent progression and this could contribute to walking dificulties- may make sense to update B12 and thyroid  levels -continue gabapentin for now     # mouth dryness #constipation/abdominal pain.  S:mouth has felt very dry since taking prednisone with Dr. Marquette.- apparently for stomach pain issues- concern for inflammation  - at least 4 episodes since may and may date back further such as with Ct 02/13/22 where she gets significant periumbilical pain. Feels better when doesn't eat anything- has tried to cut down on sugar outside of hoidays. Does smaller meals and doesn't feel hungry but does mkake herself eat- still enjoys foods. Cut out vitamins to see if it helped. Dulcolax nightly and no bowel movement if odoesnt take it -with meal preparation around Christmas felt out of breath- no recurrent issues outside of that time- felt stiff and in pain when got up from dinner like couldntg get legs to work- felt like from her hips. Feels like had been going and going and may have been worked up. Tramadol and went to bed and helpful.  -feels like overall inflamed- wonder if food related- previously tested negative for celiac with Dr. Burnette -testing can be stressful for her A/P: constipation/abdominal pain - reports MRI abdomen and pelvis that was largely reassuring-  I do want her to go ahead and call and see if they need to move up colonoscopy or if they recommend further workup with ongoing abdominal pain issues. No improvement with bowel movement.  Mouth dryness with some whitish discoloration on tongue- maybe mild thrush  #Vertigo- working with DrRONITA Marquette on this and making progress - vestibular rehab in past- they help when she does them -has also noted ringing in ears at night in last few weeks. Not pulsatile. High pitched.  -will refer to ENT with tinnitus and vertigo - though doubt meniere's - want their  opinion  #eyes have also had burning sensation with some streaks of light at times - has seen optho and was told reassuring eye exam- concern was could be migraine related -we may end up depending on how things go doing neurology consult  #Graves' disease/prior hyperthyroidism S: Presented with palpitations-seen by endocrinology Dr. Sam and started on methimazole  5 mg in the past-now off since July 2023 due to LFT elevations Lab Results  Component Value Date   TSH 0.62 10/12/2022    A/P:hopefully stable  without medications- update TSH, t3, t4  #hypertension S: medication: metoprolol  50 mg XR--> 50  mg twice daily In consult with Dr. Marquette to help with overall tension BP Readings from Last 3  Encounters:  03/13/23 132/76  10/12/22 132/78  08/02/22 120/80  A/P: stable- continue current medicines   Recommended follow up: Return for next already scheduled visit or sooner if needed. Future Appointments  Date Time Provider Department Center  04/19/2023 10:00 AM Katrinka Garnette KIDD, MD [facility]  06/08/2023  7:50 AM Shamleffer, Donell Cardinal, MD [facility] None    Lab/Order associations:   ICD-10-CM   1. Tinnitus of both ears  H93.13     2. Graves disease  E05.00     3. Hyperthyroidism  E05.90     4. Primary hypertension  I10     5. Constipation, unspecified constipation type  K59.00     6. Periumbilical abdominal pain  R10.33     7. Neuropathy  G62.9       No orders of the defined types were placed in this encounter.   Return precautions advised.  Garnette Katrinka, MD

## 2023-03-23 ENCOUNTER — Other Ambulatory Visit: Payer: Self-pay | Admitting: Family Medicine

## 2023-03-26 MED ORDER — NYSTATIN 100000 UNIT/ML MT SUSP: 200000.0000 [IU] | Freq: Four times a day (QID) | OROMUCOSAL | 0 refills | Status: AC

## 2023-03-28 ENCOUNTER — Other Ambulatory Visit: Payer: Self-pay | Admitting: Sports Medicine

## 2023-03-28 DIAGNOSIS — R531 Weakness: Secondary | ICD-10-CM

## 2023-03-28 DIAGNOSIS — R262 Difficulty in walking, not elsewhere classified: Secondary | ICD-10-CM

## 2023-03-28 DIAGNOSIS — M25551 Pain in right hip: Secondary | ICD-10-CM

## 2023-03-29 ENCOUNTER — Ambulatory Visit
Admission: RE | Admit: 2023-03-29 | Discharge: 2023-03-29 | Disposition: A | Discharge status: Home / Self Care | Payer: Commercial Managed Care - PPO | Source: Ambulatory Visit | Attending: Sports Medicine | Admitting: Sports Medicine

## 2023-03-29 DIAGNOSIS — R262 Difficulty in walking, not elsewhere classified: Secondary | ICD-10-CM

## 2023-03-29 DIAGNOSIS — M25551 Pain in right hip: Secondary | ICD-10-CM

## 2023-03-29 DIAGNOSIS — R531 Weakness: Secondary | ICD-10-CM

## 2023-04-02 ENCOUNTER — Institutional Professional Consult (permissible substitution) (INDEPENDENT_AMBULATORY_CARE_PROVIDER_SITE_OTHER): Payer: Commercial Managed Care - PPO

## 2023-04-04 ENCOUNTER — Ambulatory Visit
Admission: RE | Admit: 2023-04-04 | Discharge: 2023-04-04 | Disposition: A | Discharge status: Home / Self Care | Payer: Commercial Managed Care - PPO | Source: Ambulatory Visit | Attending: Nurse Practitioner | Admitting: Nurse Practitioner

## 2023-04-04 ENCOUNTER — Other Ambulatory Visit: Payer: Self-pay | Admitting: Nurse Practitioner

## 2023-04-04 DIAGNOSIS — K5900 Constipation, unspecified: Secondary | ICD-10-CM

## 2023-04-12 LAB — HM MAMMOGRAPHY

## 2023-04-18 ENCOUNTER — Telehealth (INDEPENDENT_AMBULATORY_CARE_PROVIDER_SITE_OTHER): Payer: Self-pay | Admitting: Otolaryngology

## 2023-04-18 NOTE — Telephone Encounter (Signed)
 Called and left vm to confirm address and appt for 04/19/2023.

## 2023-04-19 ENCOUNTER — Encounter (INDEPENDENT_AMBULATORY_CARE_PROVIDER_SITE_OTHER): Payer: Self-pay

## 2023-04-19 ENCOUNTER — Encounter: Payer: Self-pay | Admitting: Family Medicine

## 2023-04-19 ENCOUNTER — Ambulatory Visit: Payer: Commercial Managed Care - PPO | Admitting: Family Medicine

## 2023-04-19 ENCOUNTER — Ambulatory Visit (INDEPENDENT_AMBULATORY_CARE_PROVIDER_SITE_OTHER): Payer: Commercial Managed Care - PPO | Admitting: Otolaryngology

## 2023-04-19 VITALS — BP 130/72 | HR 58 | Temp 97.8°F | Ht 71.0 in | Wt 227.2 lb

## 2023-04-19 VITALS — BP 130/80 | HR 55 | Ht 71.0 in | Wt 226.0 lb

## 2023-04-19 DIAGNOSIS — E0500 Thyrotoxicosis with diffuse goiter without thyrotoxic crisis or storm: Secondary | ICD-10-CM | POA: Diagnosis not present

## 2023-04-19 DIAGNOSIS — H9313 Tinnitus, bilateral: Secondary | ICD-10-CM

## 2023-04-19 DIAGNOSIS — E0590 Thyrotoxicosis, unspecified without thyrotoxic crisis or storm: Secondary | ICD-10-CM

## 2023-04-19 DIAGNOSIS — H8110 Benign paroxysmal vertigo, unspecified ear: Secondary | ICD-10-CM

## 2023-04-19 DIAGNOSIS — R262 Difficulty in walking, not elsewhere classified: Secondary | ICD-10-CM | POA: Diagnosis not present

## 2023-04-19 DIAGNOSIS — H6123 Impacted cerumen, bilateral: Secondary | ICD-10-CM

## 2023-04-19 DIAGNOSIS — R42 Dizziness and giddiness: Secondary | ICD-10-CM | POA: Diagnosis not present

## 2023-04-19 DIAGNOSIS — I10 Essential (primary) hypertension: Secondary | ICD-10-CM

## 2023-04-19 DIAGNOSIS — H9190 Unspecified hearing loss, unspecified ear: Secondary | ICD-10-CM

## 2023-04-19 NOTE — Progress Notes (Signed)
 Phone [phone] In person visit   Subjective:   Nicole Campos is a 63 y.o. year old very pleasant female patient who presents for/with See problem oriented charting Chief Complaint  Patient presents with   Medical Management of Chronic Issues    Mammo requested. Wants to discuss being on low dose prednisone for a while.   Hypertension   Past Medical History-  Patient Active Problem List   Diagnosis Date Noted   Multinodular goiter 02/01/2022    Priority: High   Hyperthyroidism 11/10/2021    Priority: High   Elevated LFTs 11/10/2021    Priority: High   Goiter 11/10/2021    Priority: High   Graves disease 07/21/2021    Priority: High   Hyperlipidemia 07/19/2017    Priority: Medium    Hyperglycemia 07/19/2017    Priority: Medium    Palpitations 05/28/2017    Priority: Medium    Vertigo 05/28/2017    Priority: Medium    Hypertension 05/02/2016    Priority: Medium    Diffuse pain 05/02/2016    Priority: Medium    Arthritis     Priority: Medium    Neuropathy 09/25/2018    Priority: Low   Adhesive capsulitis of left shoulder 01/30/2017    Priority: Low   Family history of colon cancer 05/02/2016    Priority: Low   Microscopic hematuria 10/16/2011    Priority: Low    Medications- reviewed and updated Current Outpatient Medications  Medication Sig Dispense Refill   Acetaminophen  Extra Strength 500 MG CAPS Take 2 capsules by mouth every 8 (eight) hours.     gabapentin (NEURONTIN) 300 MG capsule Take 600 mg by mouth at bedtime. Take 2 tablets by mouth daily at bedtime     MAGNESIUM GLUCONATE PO Take 2 tablets by mouth daily.     metoprolol  succinate (TOPROL -XL) 50 MG 24 hr tablet Take 1 tablet (50 mg total) by mouth in the morning and at bedtime. Take with or immediately following a meal. 180 tablet 3   nystatin  (MYCOSTATIN ) 100000 UNIT/ML suspension Take 2 mLs (200,000 Units total) by mouth 4 (four) times daily. Swish and spit out 60 mL 0   PENNSAID 2 % SOLN  Apply 2 Pump topically as needed.     tiZANidine (ZANAFLEX) 4 MG tablet Take 4 mg by mouth once.     traMADol (ULTRAM) 50 MG tablet Take 50 mg by mouth every 6 (six) hours as needed for severe pain (pain score 7-10).     Vitamin D , Cholecalciferol, 25 MCG (1000 UT) CAPS Take 1 capsule by mouth daily in the afternoon.     diazepam  (VALIUM ) 5 MG tablet Take 1 tablet (5 mg total) by mouth every 12 (twelve) hours as needed for anxiety (do not drive for 12 hours after taking). (Patient not taking: Reported on 04/19/2023) 11 tablet 0   No current facility-administered medications for this visit.     Objective:  BP 130/72   Pulse (!) 58   Temp 97.8 F (36.6 C)   Ht 5' 11 (1.803 m)   Wt 227 lb 3.2 oz (103.1 kg)   SpO2 97%   BMI 31.69 kg/m  Gen: NAD, resting comfortably CV: RRR no murmurs rubs or gallops Lungs: CTAB no crackles, wheeze, rhonchi Abdomen: soft/nontender/nondistended/normal bowel sounds. No rebound or guarding.  Ext: trace edema Skin: warm, dry Antalgic gait-grabs at left hip    Assessment and Plan    #constipation- on linzess with gastroenterology- was told no need to  move up colonoscopy - prior abdominal pain issues. Constipation on recent x-ray with stool burden  #vertigo and tinnitus- pending ENT visit later today  #walking difficulties - degeneration in neck and was referred to neurosurgery by Dr. Marquette to see if this could be contributing. Neurosurgery told her on 04/16/23 that it is not neck or low back related- there was concern from him for PMR but sed rate and crp not elevated. ANA elevated. Prednisone had been helpful in past with a couple of rounds. They are ordering a MRI lumbar spine. They recommended rheumatology visit. - History of needing gabapentin 600 mg 4 idiopathic neuropathy of bilateral feet-perhaps we can get further answers with MRI of lumbar spine -after swims (floating/motion) or uses hot tub- pain and stiffness and almost feels like she has  forgotten how to walk. Mild weakness. Doesn't usally unrelated to in shoulders.  -also trochanteric bursitis that apparently was calcified- tramadol 3x a day helping- sleeping in chair at night was somewhat helpful. Has also been on prednisone recently for bursitis- stopped 1.5 weeks ago.  Attempted 2 shots already with the first being helpful, second not helping very much and considering third - with positive ANA and ongoing walking difficulties and possibility of polymyalgia rheumatica although ESR, crp not elevated- discussed rheumatology referral- will consult with Dr. Marquette for his preference on provider-I have reached out to him today and hoping to hear back - she reports neurosurgery mentioned low dose of prednisone. We mentioned trying to see when rheumatology visit would be before deciding on this  #Graves' disease/prior hyperthyroidism S: Presented with palpitations-seen by endocrinology Dr. Sam and started on methimazole  5 mg in the past-now off since July 2023 due to LFT elevations Lab Results  Component Value Date   TSH 0.85 03/13/2023    A/P: Remains controlled off of medication-continue to monitor  #hypertension S: medication: metoprolol  50 mg XR--> 50  mg twice daily In consult with Dr. Marquette to help with overall tension BP Readings from Last 3 Encounters:  04/19/23 130/80  04/19/23 130/72  03/13/23 132/76   A/P: Well-controlled-continue current medication  Recommended follow up: Return in about 6 months (around 10/17/2023) for physical or sooner if needed.Schedule b4 you leave. Future Appointments  Date Time Provider Department Center  05/31/2023  3:00 PM Leroux-Martinez, Rosaline Jansky, AUD [facility] None  05/31/2023  3:45 PM [address] [facility] None  06/08/2023  7:50 AM Shamleffer, Donell Cardinal, MD [facility] None  11/08/2023  9:00 AM Katrinka Garnette KIDD, MD [facility]    Lab/Order associations:   ICD-10-CM   1. Difficulty in walking  R26.2     2.  Graves disease  E05.00     3. Hyperthyroidism  E05.90     4. Primary hypertension  I10       No orders of the defined types were placed in this encounter.   Return precautions advised.  Garnette Katrinka, MD

## 2023-04-19 NOTE — Progress Notes (Signed)
 Dear Dr. Katrinka, Here is my assessment for our mutual patient, Nicole Campos. Thank you for allowing me the opportunity to care for your patient. Please do not hesitate to contact me should you have any other questions. Sincerely, Dr. Eldora Blanch  Otolaryngology Clinic Note Referring provider: Dr. Katrinka HPI:  Nicole Campos is a 63 y.o. female kindly referred by Dr. Katrinka for evaluation of bilateral tinnitus.  Initial visit (04/2023): Patient reports: noted bilateral tinnitus over the past two months, reports that it is always there but notices it when she thinks about it or when she tries to relax. High pitched, Not pulsatile. Hearing - she feels like has declined and has noted this for a while (bilaterally). No recent hearing tests. No antecedent event including URI or trauma etc. Does have some preauricular pain (not inside the ear) but this is rare and resolves by self. Does have history of cervical spine stiffness and pain but doing ok right now.   Does report some vertigo (comes and goes), lasts for seconds -- she does not report room spins but she feels like she is floating -- with head movement or turning from side to side --- she did do vestibular rehab and it helped some. She does have migraines. No roaring tinnitus, no autophony. No sound or pressure induced lightheadedness. Maybe used meclizine prior - helps?. Current episodes are not very frequent and resolve in seconds.  Patient denies: ear pain (rare), fullness, drainage Patient additionally denies: deep pain in ear canal, eustachian tube symptoms such as popping, crackling, sensitive to pressure changes Patient also denies barotrauma, vestibular suppressant use, ototoxic medication use Prior ear surgery: no No general ear problems ----------------------------------------------------------  H&N Surgery: no Personal or FHx of bleeding dz or anesthesia difficulty: no  GLP-1: no AP/AC: no  Tobacco: no. Lives in [place],  [place].  PMHx: HTN, Graves Disease, Neuropathy on Gabapentin, HLD, Shoulder pain  Independent Review of Additional Tests or Records:  Dr. Katrinka (03/13/2023) - FM: Noted numbness/tingling and neuropathy of feet on gabapentin, Vertigo (working with Dr. Marquette) and Tinnitus, ref to ENT for further eval to r/o meniere's; some walking difficulties CBC, CMP, TFTs reviewed 03/13/2023: wnl CMP, CBC wnl, TFT wnl ANA 12/2021: neg Vestibular rehab 10/10/2022 and 11/22/2022 Nicole Campos) - improved during last session, discharged; did not do DH due to shoulder/neck issues  MRI Brain w/w/o 02/20/2023 reviewed independently with respect to ears and sinuses; no mastoid effusion, no obvious large retrocochlear lesions noted; generally clear paranasal sinus; no contrast enhancing masses through [facility] PMH/Meds/All/SocHx/FamHx/ROS:   Past Medical History:  Diagnosis Date   Arthritis    cervical, lumbar, knees, feet. mild hands   Headache(784.0)    Hx: of occasinal Migraine   Heart palpitations    Hx: of   Hypertension    PONV (postoperative nausea and vomiting)    Thyroid  disease    Hyperthyroidism-Graves Disease     Past Surgical History:  Procedure Laterality Date   APPENDECTOMY     CESAREAN SECTION  1990   CHOLECYSTECTOMY N/A 02/28/2013   Procedure: LAPAROSCOPIC CHOLECYSTECTOMY;  Surgeon: Lynda Leos, MD;  Location: [facility] OR;  Service: General;  Laterality: N/A;   COLONOSCOPY W/ POLYPECTOMY     Hx: of   KNEE SURGERY     Left knee - meniscus tear   LAPAROSCOPIC APPENDECTOMY N/A 08/05/2012   Procedure: APPENDECTOMY LAPAROSCOPIC;  Surgeon: Debby LABOR. Cornett, MD;  Location: [facility]S;  Service: General;  Laterality: N/A;   wisdom teeth  Family History  Problem Relation Age of Onset   Uterine cancer Mother        presented with bleeding   Hypertension Mother    Pancreatic cancer Father    Cancer - Colon Brother    Stroke Maternal Grandmother         late 65s   Lung cancer Maternal Grandfather     Heart attack Paternal Grandmother        late 74s   Heart attack Paternal Grandfather        died 19     Social Connections: Unknown (04/19/2023)   Social Connection and Isolation Panel [NHANES]    Frequency of Communication with Friends and Family: Patient declined    Frequency of Social Gatherings with Friends and Family: Patient declined    Attends Religious Services: Patient declined    Database Administrator or Organizations: No    Attends Engineer, Structural: Not on file    Marital Status: Married      Current Outpatient Medications:    Acetaminophen  Extra Strength 500 MG CAPS, Take 2 capsules by mouth every 8 (eight) hours., Disp: , Rfl:    gabapentin (NEURONTIN) 300 MG capsule, Take 600 mg by mouth at bedtime. Take 2 tablets by mouth daily at bedtime, Disp: , Rfl:    MAGNESIUM GLUCONATE PO, Take 2 tablets by mouth daily., Disp: , Rfl:    metoprolol  succinate (TOPROL -XL) 50 MG 24 hr tablet, Take 1 tablet (50 mg total) by mouth in the morning and at bedtime. Take with or immediately following a meal., Disp: 180 tablet, Rfl: 3   nystatin  (MYCOSTATIN ) 100000 UNIT/ML suspension, Take 2 mLs (200,000 Units total) by mouth 4 (four) times daily. Swish and spit out, Disp: 60 mL, Rfl: 0   PENNSAID 2 % SOLN, Apply 2 Pump topically as needed., Disp: , Rfl:    tiZANidine (ZANAFLEX) 4 MG tablet, Take 4 mg by mouth once., Disp: , Rfl:    traMADol (ULTRAM) 50 MG tablet, Take 50 mg by mouth every 6 (six) hours as needed for severe pain (pain score 7-10)., Disp: , Rfl:    Vitamin D , Cholecalciferol, 25 MCG (1000 UT) CAPS, Take 1 capsule by mouth daily in the afternoon., Disp: , Rfl:    diazepam  (VALIUM ) 5 MG tablet, Take 1 tablet (5 mg total) by mouth every 12 (twelve) hours as needed for anxiety (do not drive for 12 hours after taking). (Patient not taking: Reported on 04/19/2023), Disp: 11 tablet, Rfl: 0   Physical Exam:   BP 130/80 (BP Location: Left Arm, Patient Position: Sitting,  Cuff Size: Large)   Pulse (!) 55   Ht 5' 11 (1.803 m)   Wt 226 lb (102.5 kg)   SpO2 98%   BMI 31.52 kg/m   Salient findings:  CN II-XII intact B/l cerumen impaction; Given history and complaints, ear microscopy was indicated and performed for evaluation with findings as below in physical exam section and in procedures; after cerumen clearance, Bilateral EAC clear and TM intact with well pneumatized middle ear spaces Weber 512: mid Rinne 512: AC > BC b/l  Head shake neg; no nystagmus grossly; did not do DH 2/2 neck issues Anterior rhinoscopy: Septum intact; bilateral inferior turbinates without significant hypertrophy No lesions of oral cavity/oropharynx; clear b/l TMJ crepitus No obviously palpable neck masses/lymphadenopathy/thyromegaly No respiratory distress or stridor  Seprately Identifiable Procedures:  Procedure: Bilateral ear microscopy and cerumen removal using microscope (CPT 30789) - Mod 25 Pre-procedure diagnosis:  Cerumen impaction bilateral external ears Post-procedure diagnosis: same Indication: bilateral cerumen impaction; given patient's otologic complaints and history as well as for improved and comprehensive examination of external ear and tympanic membrane, bilateral otologic examination using microscope was performed and impacted cerumen removed  Procedure: Patient was placed semi-recumbent. Both ear canals were examined using the microscope with findings above. Impacted cerumen removed on left and on right using suction and currette with improvement in EAC examination and patency. Patient tolerated the procedure well.  Impression & Plans:  Nicole Campos is a 63 y.o. female with cervical spine pain and neuropathy with:  1. Subjective hearing loss   2. Tinnitus of both ears   3. Lightheadedness   4. BPPV (benign paroxysmal positional vertigo), unspecified laterality   5. Bilateral impacted cerumen    Multiple issues discussed today: - Ceruminosis: advise  mineral oil few drops 1-2x/week - Non-pulsatile Tinnitus: several possible causes, including from somatic cause (c-spine), and hearing loss (given subjective hearing loss); discussed masking and she will try masking device; consider flavonoid trial to see if it helps; also d/w pt tinnitus retraining - Subjective hearing loss: will get audio - f/u 4 weeks with audio - Lightheadedness - does not sound like frank vertigo and if it does end up as this, most likely BPPV as it only lasts few seconds; we discussed multifactorial etiology for this and imbalance including neuropathy; offered repeat rehab v/s formal vest testing; would like to observe for now  See below regarding exact medications prescribed this encounter including dosages and route: No orders of the defined types were placed in this encounter.     Thank you for allowing me the opportunity to care for your patient. Please do not hesitate to contact me should you have any other questions.  Sincerely, Eldora Blanch, MD Otolaryngologist (ENT), [facility] Phone: [phone] Fax: [phone]  04/19/2023, 2:41 PM   MDM:  Level 4 - [phone] Complexity/Problems addressed: mod - multiple chronic problems Data complexity: mod - independent review of notes, labs; independent interpretation of MRI imaging; ordering test - Morbidity: low  - Prescription Drug prescribed or managed: no

## 2023-04-19 NOTE — Patient Instructions (Addendum)
 Health Maintenance Due  Topic Date Due   MAMMOGRAM  01/19/2023  Team requesting a copy  I will reach out to Dr. Marquette about his preference for rheumatology  Recommended follow up: Return in about 6 months (around 10/17/2023) for physical or sooner if needed.Schedule b4 you leave.

## 2023-04-19 NOTE — Patient Instructions (Addendum)
 Try flavonoids for tinnitus/ringing If rining bothers are night, try [employer] white noise machine

## 2023-04-20 ENCOUNTER — Other Ambulatory Visit: Payer: Self-pay | Admitting: Neurological Surgery

## 2023-04-20 DIAGNOSIS — M5416 Radiculopathy, lumbar region: Secondary | ICD-10-CM

## 2023-04-23 ENCOUNTER — Encounter: Payer: Self-pay | Admitting: Neurological Surgery

## 2023-04-25 ENCOUNTER — Encounter: Payer: Self-pay | Admitting: Family Medicine

## 2023-04-25 DIAGNOSIS — R262 Difficulty in walking, not elsewhere classified: Secondary | ICD-10-CM

## 2023-04-25 DIAGNOSIS — R768 Other specified abnormal immunological findings in serum: Secondary | ICD-10-CM

## 2023-04-27 ENCOUNTER — Ambulatory Visit
Admission: RE | Admit: 2023-04-27 | Discharge: 2023-04-27 | Disposition: A | Discharge status: Home / Self Care | Payer: Commercial Managed Care - PPO | Source: Ambulatory Visit | Attending: Neurological Surgery | Admitting: Neurological Surgery

## 2023-04-27 DIAGNOSIS — M5416 Radiculopathy, lumbar region: Secondary | ICD-10-CM

## 2023-05-08 ENCOUNTER — Telehealth: Payer: Self-pay | Admitting: Family Medicine

## 2023-05-08 NOTE — Telephone Encounter (Signed)
 Ok to order ANA lab?

## 2023-05-08 NOTE — Telephone Encounter (Signed)
 Fax [phone]   patient is needing additional blood work the   [employer] needs to be on the lab work for patient referral to the rheumatologist

## 2023-05-08 NOTE — Telephone Encounter (Signed)
 The original ANA we performed was negative but report was from Dr. Janeece Riggers office that it was later positive-please reach out to Dr. Janeece Riggers office and should be able to get a copy of this

## 2023-05-09 NOTE — Telephone Encounter (Signed)
 Message communicated via mychart

## 2023-05-15 ENCOUNTER — Telehealth: Payer: Self-pay

## 2023-05-15 NOTE — Telephone Encounter (Signed)
 Message sent to pt via mychart to reach out to Dr. Berline Chough for these results.  Copied from CRM [id number]. Topic: Clinical - Lab/Test Results >> May 15, 2023  8:02 AM Lennart Pall wrote: Reason for CRM: Boyd Kerbs from [facility] states they haven't received a copy of the pts ANA results - patient wont be scheduled until they receive this.  ATTNAilene Ards Fax # [phone]

## 2023-05-22 ENCOUNTER — Telehealth: Payer: Self-pay

## 2023-05-22 NOTE — Telephone Encounter (Signed)
 Called and lm on referral coordinators office at [facility] medical stating that they would need to reach out to Dr. Janeece Riggers office to get the appropriate labs that they are needing. I also left my number for them to call back if needed. Pt was made aware via mychart also.  Copied from CRM [id number]. Topic: General - Other >> May 22, 2023 10:25 AM Turkey A wrote: Reason for CRM: [facility] calling regarding lab results that are needed to schedule appointment for patient. Please fax results to [phone] Attention [employer]

## 2023-05-30 ENCOUNTER — Telehealth (INDEPENDENT_AMBULATORY_CARE_PROVIDER_SITE_OTHER): Payer: Self-pay | Admitting: Otolaryngology

## 2023-05-30 NOTE — Telephone Encounter (Signed)
 LVM to confirm appt & location [id number] afm

## 2023-05-31 ENCOUNTER — Ambulatory Visit (INDEPENDENT_AMBULATORY_CARE_PROVIDER_SITE_OTHER): Payer: Commercial Managed Care - PPO | Admitting: Audiology

## 2023-05-31 ENCOUNTER — Ambulatory Visit (INDEPENDENT_AMBULATORY_CARE_PROVIDER_SITE_OTHER): Payer: Commercial Managed Care - PPO | Admitting: Otolaryngology

## 2023-05-31 ENCOUNTER — Encounter (INDEPENDENT_AMBULATORY_CARE_PROVIDER_SITE_OTHER): Payer: Self-pay

## 2023-05-31 VITALS — BP 112/70 | HR 58 | Ht 71.0 in | Wt 223.0 lb

## 2023-05-31 DIAGNOSIS — H8110 Benign paroxysmal vertigo, unspecified ear: Secondary | ICD-10-CM | POA: Diagnosis not present

## 2023-05-31 DIAGNOSIS — H903 Sensorineural hearing loss, bilateral: Secondary | ICD-10-CM

## 2023-05-31 DIAGNOSIS — H9313 Tinnitus, bilateral: Secondary | ICD-10-CM | POA: Diagnosis not present

## 2023-05-31 DIAGNOSIS — R42 Dizziness and giddiness: Secondary | ICD-10-CM

## 2023-05-31 NOTE — Progress Notes (Signed)
  [address], [address] [place], [place] [address] [phone]  Audiological Evaluation    Name: Nicole Campos     DOB:   1960/09/01      MRN:   [id number]                                                                                     Service Date: 05/31/2023     Accompanied by: unaccompanied   Patient comes today after Dr. Allena Katz, ENT sent a referral for a hearing evaluation due to concerns with dizziness.   Symptoms Yes Details  Hearing loss  [x]  Maybe some difficulty hearing  Tinnitus  [x]  Both ears, noticed in the last 6 months.  Ear pain/ infections/pressure  []    Balance problems  [x]  Float like sensation when she turns her head quickly - lasts about 5-10 seconds.  Noise exposure history  []    Previous ear surgeries  []    Family history of hearing loss  []    Amplification  []    Other  []      Otoscopy: Right ear: Clear external ear canals and notable landmarks visualized on the tympanic membrane. Left ear:  Clear external ear canals and notable landmarks visualized on the tympanic membrane.  Tympanometry: Right ear: Type A- Normal external ear canal volume with normal middle ear pressure and tympanic membrane compliance Left ear: Type A- Normal external ear canal volume with normal middle ear pressure and tympanic membrane compliance    Pure tone Audiometry: Both ears- Normal hearing from [phone], then mild presumably sensorineural hearing loss at 8000 Hz  .  Speech Audiometry: Right ear- Speech Reception Threshold (SRT) was obtained at 5 dBHL. Left ear-Speech Reception Threshold (SRT) was obtained at 10 dBHL.   Word Recognition Score Tested using NU-6 (MLV) Right ear: 100% was obtained at a presentation level of 60 dBHL with contralateral masking which is deemed as  excellent. Left ear: 96% was obtained at a presentation level of 60 dBHL with contralateral masking which is deemed as  excellent.   The hearing test results were completed under  headphones and results are deemed to be of good reliability. Test technique:  conventional        Recommendations: Follow up with ENT as scheduled for today. Return for a hearing evaluation in 2 years, before if concerns with hearing changes arise or per MD recommendation.   [name], AUD

## 2023-05-31 NOTE — Progress Notes (Signed)
 Dear Dr. Durene Cal, Here is my assessment for our mutual patient, Belissa Kooy. Thank you for allowing me the opportunity to care for your patient. Please do not hesitate to contact me should you have any other questions. Sincerely, Dr. Jovita Kussmaul  Otolaryngology Clinic Note Referring provider: Dr. Durene Cal HPI:  Nicole Campos is a 63 y.o. female kindly referred by Dr. Durene Cal for evaluation of bilateral tinnitus.  Initial visit (04/2023): Patient reports: noted bilateral tinnitus over the past two months, reports that it is always there but notices it when she thinks about it or when she tries to relax. High pitched, Not pulsatile. Hearing - she feels like has declined and has noted this for a while (bilaterally). No recent hearing tests. No antecedent event including URI or trauma etc. Does have some preauricular pain (not inside the ear) but this is rare and resolves by self. Does have history of cervical spine stiffness and pain but doing ok right now.   Does report some vertigo (comes and goes), lasts for seconds -- she does not report room spins but she feels like she is "floating" -- with head movement or turning from side to side --- she did do vestibular rehab and it helped some. She does have migraines. No roaring tinnitus, no autophony. No sound or pressure induced lightheadedness. Maybe used meclizine prior - helps?. Current episodes are not very frequent and resolve in seconds.  Patient denies: ear pain (rare), fullness, drainage Patient additionally denies: deep pain in ear canal, eustachian tube symptoms such as popping, crackling, sensitive to pressure changes Patient also denies barotrauma, vestibular suppressant use, ototoxic medication use Prior ear surgery: no No general ear problems --------------------------------------------------------- 05/31/2023: Tinnitus bilateral, same. Hearing is the same. Sometimes she is not aware of it, but other times it is significantly worse (like in  quiet). She did have an audio today. Has not tried masking device. No vertigo, pain, drainage. ---------------------------------------------  H&N Surgery: no Personal or FHx of bleeding dz or anesthesia difficulty: no  GLP-1: no AP/AC: no  Tobacco: no. Lives in [place], [place].  PMHx: HTN, Graves Disease, Neuropathy on Gabapentin, HLD, Shoulder pain  Independent Review of Additional Tests or Records:  Dr. Durene Cal (03/13/2023) - FM: Noted numbness/tingling and neuropathy of feet on gabapentin, Vertigo (working with Dr. Berline Chough) and Tinnitus, ref to ENT for further eval to r/o meniere's; some walking difficulties CBC, CMP, TFTs reviewed 03/13/2023: wnl CMP, CBC wnl, TFT wnl ANA 12/2021: neg Vestibular rehab 10/10/2022 and 11/22/2022 Namon Cirri) - improved during last session, discharged; did not do DH due to shoulder/neck issues  MRI Brain w/w/o 02/20/2023 reviewed independently with respect to ears and sinuses; no mastoid effusion, no obvious large retrocochlear lesions noted; generally clear paranasal sinus; no contrast enhancing masses through [facility]  05/2023 Audiogram was independently reviewed and interpreted by me and it reveals - normal hearing thresholds and A/A tymps except for mild b/l high frequency SNHL; WRT 100% and 96% AD, AS at 60dB    SNHL= Sensorineural hearing loss  PMH/Meds/All/SocHx/FamHx/ROS:   Past Medical History:  Diagnosis Date   Arthritis    cervical, lumbar, knees, feet. mild hands   Headache(784.0)    Hx: of occasinal Migraine   Heart palpitations    Hx: of   Hypertension    PONV (postoperative nausea and vomiting)    Thyroid disease    Hyperthyroidism-Graves Disease     Past Surgical History:  Procedure Laterality Date   APPENDECTOMY     CESAREAN SECTION  1990  CHOLECYSTECTOMY N/A 02/28/2013   Procedure: LAPAROSCOPIC CHOLECYSTECTOMY;  Surgeon: Axel Filler, MD;  Location: [facility] OR;  Service: General;  Laterality: N/A;   COLONOSCOPY W/ POLYPECTOMY      Hx: of   KNEE SURGERY     Left knee - meniscus tear   LAPAROSCOPIC APPENDECTOMY N/A 08/05/2012   Procedure: APPENDECTOMY LAPAROSCOPIC;  Surgeon: Clovis Pu. Cornett, MD;  Location: [facility] ORS;  Service: General;  Laterality: N/A;   wisdom teeth      Family History  Problem Relation Age of Onset   Uterine cancer Mother        presented with bleeding   Hypertension Mother    Pancreatic cancer Father    Cancer - Colon Brother    Stroke Maternal Grandmother         late 75s   Lung cancer Maternal Grandfather    Heart attack Paternal Grandmother        late 24s   Heart attack Paternal Grandfather        died 58     Social Connections: Unknown (04/19/2023)   Social Connection and Isolation Panel [NHANES]    Frequency of Communication with Friends and Family: Patient declined    Frequency of Social Gatherings with Friends and Family: Patient declined    Attends Religious Services: Patient declined    Database administrator or Organizations: No    Attends Engineer, structural: Not on file    Marital Status: Married      Current Outpatient Medications:    Acetaminophen Extra Strength 500 MG CAPS, Take 2 capsules by mouth every 8 (eight) hours., Disp: , Rfl:    diazepam (VALIUM) 5 MG tablet, Take 1 tablet (5 mg total) by mouth every 12 (twelve) hours as needed for anxiety (do not drive for 12 hours after taking)., Disp: 11 tablet, Rfl: 0   gabapentin (NEURONTIN) 300 MG capsule, Take 600 mg by mouth at bedtime. Take 2 tablets by mouth daily at bedtime, Disp: , Rfl:    MAGNESIUM GLUCONATE PO, Take 2 tablets by mouth daily., Disp: , Rfl:    metoprolol succinate (TOPROL-XL) 50 MG 24 hr tablet, Take 1 tablet (50 mg total) by mouth in the morning and at bedtime. Take with or immediately following a meal., Disp: 180 tablet, Rfl: 3   nystatin (MYCOSTATIN) 100000 UNIT/ML suspension, Take 2 mLs (200,000 Units total) by mouth 4 (four) times daily. Swish and spit out, Disp: 60 mL, Rfl: 0    PENNSAID 2 % SOLN, Apply 2 Pump topically as needed., Disp: , Rfl:    tiZANidine (ZANAFLEX) 4 MG tablet, Take 4 mg by mouth once., Disp: , Rfl:    traMADol (ULTRAM) 50 MG tablet, Take 50 mg by mouth every 6 (six) hours as needed for severe pain (pain score 7-10)., Disp: , Rfl:    Vitamin D, Cholecalciferol, 25 MCG (1000 UT) CAPS, Take 1 capsule by mouth daily in the afternoon., Disp: , Rfl:    Physical Exam:   BP 112/70 (BP Location: Right Arm, Patient Position: Sitting, Cuff Size: Large)   Pulse (!) 58   Ht 5\' 11"  (1.803 m)   Wt 223 lb (101.2 kg)   SpO2 95%   BMI 31.10 kg/m   Salient findings:  CN II-XII intact Bilateral EAC clear and TM intact with well pneumatized middle ear spaces Weber 512: mid Rinne 512: AC > BC b/l  Head shake neg; no nystagmus grossly; did not do DH 2/2 neck issues Anterior rhinoscopy:  Septum intact; bilateral inferior turbinates without significant hypertrophy No lesions of oral cavity/oropharynx; clear b/l TMJ crepitus No obviously palpable neck masses/lymphadenopathy/thyromegaly No respiratory distress or stridor  Seprately Identifiable Procedures:  None  Impression & Plans:  Itzelle Gains is a 62 y.o. female with cervical spine pain and neuropathy with:  1. Tinnitus of both ears   2. Sensorineural hearing loss (SNHL) of both ears   3. Lightheadedness   4. BPPV (benign paroxysmal positional vertigo), unspecified laterality    - Ceruminosis: advise mineral oil few drops 1-2x/week - Non-pulsatile Tinnitus: several possible causes, including from somatic cause (c-spine), or HF SNHL discussed masking and she has not tried it but will try masking device; consider flavonoid trial to see if it helps; also d/w pt tinnitus retraining - Lightheadedness -does not sound like frank vertigo and if it does end up as this, most likely BPPV as it only lasts few seconds; we discussed multifactorial etiology for this and imbalance including neuropathy; she would like to  observe for now  Thank you for allowing me the opportunity to care for your patient. Please do not hesitate to contact me should you have any other questions.  Sincerely, Jovita Kussmaul, MD Otolaryngologist (ENT), [facility] Phone: [phone] Fax: [phone]  05/31/2023, 3:57 PM   MDM:  Level 3 - 99213 Complexity/Problems addressed: mod - multiple chronic problems Data complexity: low - review of testing, notes - Morbidity: low  - Prescription Drug prescribed or managed: no

## 2023-06-08 ENCOUNTER — Ambulatory Visit: Payer: Commercial Managed Care - PPO | Admitting: Internal Medicine

## 2023-06-21 ENCOUNTER — Encounter: Payer: Self-pay | Admitting: Internal Medicine

## 2023-06-21 ENCOUNTER — Ambulatory Visit: Admitting: Internal Medicine

## 2023-06-21 VITALS — BP 120/78 | HR 50 | Ht 71.0 in | Wt 236.0 lb

## 2023-06-21 DIAGNOSIS — E0500 Thyrotoxicosis with diffuse goiter without thyrotoxic crisis or storm: Secondary | ICD-10-CM | POA: Diagnosis not present

## 2023-06-21 LAB — T3, FREE: T3, Free: 3.8 pg/mL (ref 2.3–4.2)

## 2023-06-21 LAB — TSH: TSH: 1.53 m[IU]/L (ref 0.40–4.50)

## 2023-06-21 LAB — T4, FREE: Free T4: 1.2 ng/dL (ref 0.8–1.8)

## 2023-06-21 NOTE — Progress Notes (Unsigned)
 Name: Albertine Lafoy  MRN/ DOB: [id number], May 13, 1960    Age/ Sex: 63 y.o., female    PCP: Shelva Majestic, MD   Reason for Endocrinology Evaluation: Hyperthyroid     Date of Initial Endocrinology Evaluation: 07/01/2021    HPI: Ms. Aminat Shelburne is a 63 y.o. female with a past medical history of HTN, neuropathy. The patient presented for initial endocrinology clinic visit on 07/01/2021 for consultative assistance with her hyperthyroid.   Patient has been diagnosed with hyperthyroidism in March 2023 when she presented to her PCP with tachycardia and palpitations.  She was also noted to have elevated TRAb at 2.45 IU/L  Thyroid ultrasound on 07/01/2021 showed multiple thyroid nodules, none met FNA criteria  No Fh of thyroid disease   She was started on methimazole 5 mg daily, by October 05, 2021 her AST was elevated from 19 to 106 U/L and ALT increased from 22 to 226 U/L with normal alkaline phosphatase at 70.   Abdominal ultrasound revealed a picture consistent with hepatic steatosis  LFTs improved with discontinuation of methimazole in 09/2021  but remained elevated     TFTs normalized by 01/2022       SUBJECTIVE:    Today (06/21/23): Ms. Fetting is here for a follow up on hyperthyroidism and Graves' disease.   She was recently evaluated by ENT for bilateral tinnitus, as well as vertigo  She currently has back pain and calcific hip tendonitis, on prednisone for PMR, follows with rheumatology . Pending MTX  Continues with chronic palpitations, on metoprolol  Denies local neck swelling  Denies tremors Continues with constipation , on linzess   HISTORY:  Past Medical History:  Past Medical History:  Diagnosis Date   Arthritis    cervical, lumbar, knees, feet. mild hands   Headache(784.0)    Hx: of occasinal Migraine   Heart palpitations    Hx: of   Hypertension    PONV (postoperative nausea and vomiting)    Thyroid disease    Hyperthyroidism-Graves  Disease   Past Surgical History:  Past Surgical History:  Procedure Laterality Date   APPENDECTOMY     CESAREAN SECTION  1990   CHOLECYSTECTOMY N/A 02/28/2013   Procedure: LAPAROSCOPIC CHOLECYSTECTOMY;  Surgeon: Axel Filler, MD;  Location: [facility] OR;  Service: General;  Laterality: N/A;   COLONOSCOPY W/ POLYPECTOMY     Hx: of   KNEE SURGERY     Left knee - meniscus tear   LAPAROSCOPIC APPENDECTOMY N/A 08/05/2012   Procedure: APPENDECTOMY LAPAROSCOPIC;  Surgeon: Clovis Pu. Cornett, MD;  Location: [facility];  Service: General;  Laterality: N/A;   wisdom teeth      Social History:  reports that she has never smoked. She has never used smokeless tobacco. She reports current alcohol use of about 4.0 standard drinks of alcohol per week. She reports that she does not use drugs. Family History: family history includes Cancer - Colon in her brother; Heart attack in her paternal grandfather and paternal grandmother; Hypertension in her mother; Lung cancer in her maternal grandfather; Pancreatic cancer in her father; Stroke in her maternal grandmother; Uterine cancer in her mother.   HOME MEDICATIONS: Allergies as of 06/21/2023       Reactions   Meperidine Hcl Nausea And Vomiting   Penicillins Itching   Pt states this may be an error and that she "was given a derivative" of PCN with no reactions at all.        Medication List  Accurate as of June 21, 2023  6:53 AM. If you have any questions, ask your nurse or doctor.          Acetaminophen Extra Strength 500 MG Caps Take 2 capsules by mouth every 8 (eight) hours.   diazepam 5 MG tablet Commonly known as: VALIUM Take 1 tablet (5 mg total) by mouth every 12 (twelve) hours as needed for anxiety (do not drive for 12 hours after taking).   gabapentin 300 MG capsule Commonly known as: NEURONTIN Take 600 mg by mouth at bedtime. Take 2 tablets by mouth daily at bedtime   MAGNESIUM GLUCONATE PO Take 2 tablets by mouth daily.    metoprolol succinate 50 MG 24 hr tablet Commonly known as: TOPROL-XL Take 1 tablet (50 mg total) by mouth in the morning and at bedtime. Take with or immediately following a meal.   nystatin 100000 UNIT/ML suspension Commonly known as: MYCOSTATIN Take 2 mLs (200,000 Units total) by mouth 4 (four) times daily. Swish and spit out   Pennsaid 2 % Soln Generic drug: diclofenac Sodium Apply 2 Pump topically as needed.   tiZANidine 4 MG tablet Commonly known as: ZANAFLEX Take 4 mg by mouth once.   traMADol 50 MG tablet Commonly known as: ULTRAM Take 50 mg by mouth every 6 (six) hours as needed for severe pain (pain score 7-10).   Vitamin D (Cholecalciferol) 25 MCG (1000 UT) Caps Take 1 capsule by mouth daily in the afternoon.          REVIEW OF SYSTEMS: A comprehensive ROS was conducted with the patient and is negative except as per HPI    OBJECTIVE:  VS: There were no vitals taken for this visit.   Wt Readings from Last 3 Encounters:  05/31/23 223 lb (101.2 kg)  04/19/23 226 lb (102.5 kg)  04/19/23 227 lb 3.2 oz (103.1 kg)     EXAM: General: Pt appears well and is in NAD  Eyes: External eye exam normal without stare  Neck: General: Supple without adenopathy. Thyroid: Thyroid size normal.  No goiter or nodules appreciated. .  Lungs: Clear with good BS bilat  Heart: Auscultation: RRR.  Abdomen: soft, nontender  Extremities:  BL LE: No pretibial edema      DATA REVIEWED:  Latest Reference Range & Units 06/21/23 08:03  TSH 0.40 - 4.50 mIU/L 1.53  Triiodothyronine,Free,Serum 2.3 - 4.2 pg/mL 3.8  T4,Free(Direct) 0.8 - 1.8 ng/dL 1.2      Latest Reference Range & Units 04/07/22 09:37  Sodium 135 - 145 mEq/L 142  Potassium 3.5 - 5.1 mEq/L 4.8  Chloride 96 - 112 mEq/L 106  CO2 19 - 32 mEq/L 30  Glucose 70 - 99 mg/dL 77  BUN 6 - 23 mg/dL 17  Creatinine 1.61 - 0.96 mg/dL 0.45  Calcium 8.4 - 40.9 mg/dL 9.9  Alkaline Phosphatase 39 - 117 U/L 64  Albumin 3.5 -  5.2 g/dL 4.1  AST 0 - 37 U/L 36  ALT 0 - 35 U/L 68 (H)  Total Protein 6.0 - 8.3 g/dL 6.3  Total Bilirubin 0.2 - 1.2 mg/dL 0.8  GFR >81.19 mL/min 93.01  TSH 0.35 - 5.50 uIU/mL 0.89  Triiodothyronine,Free,Serum 2.3 - 4.2 pg/mL 2.7  T4,Free(Direct) 0.60 - 1.60 ng/dL 1.47  (H): Data is abnormally high    Latest Reference Range & Units 06/14/21 09:12  TRAB <=2.00 IU/L 2.57 (H)    Thyroid ultrasound 07/01/2021  Estimated total number of nodules >/= 1 cm: 2   Number  of spongiform nodules >/=  2 cm not described below (TR1): 0   Number of mixed cystic and solid nodules >/= 1.5 cm not described below (TR2): 0   _________________________________________________________   Nodule labeled 1 in the right mid thyroid, 1.1 cm. Nodule has spongiform characteristics and does not meet criteria for surveillance.   Nodule labeled 2, right lower thyroid, 8 mm with spongiform characteristics and does not meet criteria for surveillance.   Nodule labeled 3, inferior left thyroid, 9 mm. Nodule has TR 4 characteristics and does not meet criteria for surveillance.   Nodule labeled 4, superior left thyroid, 1.5 cm, with TR 2 characteristics and does not meet criteria for surveillance.   Nodule labeled 5, mid left thyroid, 9 mm. Nodule has TR 2 characteristics and does not meet criteria for surveillance   No adenopathy.   Recommendations follow those established by the new ACR TI-RADS criteria (J Am Coll Radiol 2017;14:587-595).   IMPRESSION: Heterogeneous multinodular thyroid, potentially representing medical thyroid disease, as above.    ASSESSMENT/PLAN/RECOMMENDATIONS:   Hyperthyroidism:  -Resolved -She has been off methimazole therapy since July, 2023 due to elevated LFTs -Repeat TFTs are normal today    2.  Graves' disease:  -Slight elevation in TRAb -No extrathyroidal manifestation of Graves' disease   3. MNG:  -No local neck symptoms  -None of the nodules met criteria  for surveillance      Follow-up in 1 yr    Signed electronically by: Lyndle Herrlich, MD  [facility]  [facility] [address] [place] [place], [place] [address] Phone: [phone] FAX: [phone]   CC: Shelva Majestic, MD [address] [place] [address] Phone: [phone] Fax: [phone]   Return to Endocrinology clinic as below: Future Appointments  Date Time Provider Department Center  06/21/2023  7:30 AM Itzae Mccurdy, Konrad Dolores, MD [facility] None  11/08/2023  9:00 AM Shelva Majestic, MD [facility]

## 2023-06-22 ENCOUNTER — Encounter: Payer: Self-pay | Admitting: Internal Medicine

## 2023-08-07 ENCOUNTER — Encounter: Payer: Self-pay | Admitting: Audiology

## 2023-08-11 ENCOUNTER — Other Ambulatory Visit: Payer: Self-pay | Admitting: Family Medicine

## 2023-11-08 ENCOUNTER — Ambulatory Visit (INDEPENDENT_AMBULATORY_CARE_PROVIDER_SITE_OTHER): Payer: Commercial Managed Care - PPO | Admitting: Family Medicine

## 2023-11-08 ENCOUNTER — Ambulatory Visit: Payer: Self-pay | Admitting: Family Medicine

## 2023-11-08 ENCOUNTER — Encounter: Payer: Self-pay | Admitting: Family Medicine

## 2023-11-08 VITALS — BP 120/60 | HR 53 | Temp 97.2°F | Ht 71.0 in | Wt 235.2 lb

## 2023-11-08 DIAGNOSIS — E0500 Thyrotoxicosis with diffuse goiter without thyrotoxic crisis or storm: Secondary | ICD-10-CM | POA: Diagnosis not present

## 2023-11-08 DIAGNOSIS — I10 Essential (primary) hypertension: Secondary | ICD-10-CM

## 2023-11-08 DIAGNOSIS — R739 Hyperglycemia, unspecified: Secondary | ICD-10-CM | POA: Diagnosis not present

## 2023-11-08 DIAGNOSIS — Z131 Encounter for screening for diabetes mellitus: Secondary | ICD-10-CM

## 2023-11-08 DIAGNOSIS — E0590 Thyrotoxicosis, unspecified without thyrotoxic crisis or storm: Secondary | ICD-10-CM

## 2023-11-08 DIAGNOSIS — Z0000 Encounter for general adult medical examination without abnormal findings: Secondary | ICD-10-CM

## 2023-11-08 DIAGNOSIS — E785 Hyperlipidemia, unspecified: Secondary | ICD-10-CM

## 2023-11-08 LAB — LIPID PANEL
Cholesterol: 258 mg/dL — ABNORMAL HIGH (ref 0–200)
HDL: 68.1 mg/dL (ref >39.00)
LDL Cholesterol: 167 mg/dL — ABNORMAL HIGH (ref 0–99)
NonHDL: 190.39
Total CHOL/HDL Ratio: 4
Triglycerides: 116 mg/dL (ref 0.0–149.0)
VLDL: 23.2 mg/dL (ref 0.0–40.0)

## 2023-11-08 LAB — COMPREHENSIVE METABOLIC PANEL WITH GFR
ALT: 19 U/L (ref 0–35)
AST: 18 U/L (ref 0–37)
Albumin: 4.3 g/dL (ref 3.5–5.2)
Alkaline Phosphatase: 66 U/L (ref 39–117)
BUN: 17 mg/dL (ref 6–23)
CO2: 31 meq/L (ref 19–32)
Calcium: 9.9 mg/dL (ref 8.4–10.5)
Chloride: 103 meq/L (ref 96–112)
Creatinine, Ser: 0.78 mg/dL (ref 0.40–1.20)
GFR: 79.64 mL/min (ref >60.00)
Glucose, Bld: 86 mg/dL (ref 70–99)
Potassium: 5.1 meq/L (ref 3.5–5.1)
Sodium: 140 meq/L (ref 135–145)
Total Bilirubin: 0.9 mg/dL (ref 0.2–1.2)
Total Protein: 6.7 g/dL (ref 6.0–8.3)

## 2023-11-08 LAB — CBC WITH DIFFERENTIAL/PLATELET
Basophils Absolute: 0 K/uL (ref 0.0–0.1)
Basophils Relative: 0.5 % (ref 0.0–3.0)
Eosinophils Absolute: 0.1 K/uL (ref 0.0–0.7)
Eosinophils Relative: 2.5 % (ref 0.0–5.0)
HCT: 42.2 % (ref 36.0–46.0)
Hemoglobin: 14.3 g/dL (ref 12.0–15.0)
Lymphocytes Relative: 26.9 % (ref 12.0–46.0)
Lymphs Abs: 1.4 K/uL (ref 0.7–4.0)
MCHC: 33.9 g/dL (ref 30.0–36.0)
MCV: 97.5 fl (ref 78.0–100.0)
Monocytes Absolute: 0.5 K/uL (ref 0.1–1.0)
Monocytes Relative: 9.3 % (ref 3.0–12.0)
Neutro Abs: 3.2 K/uL (ref 1.4–7.7)
Neutrophils Relative %: 60.8 % (ref 43.0–77.0)
Platelets: 290 K/uL (ref 150.0–400.0)
RBC: 4.33 Mil/uL (ref 3.87–5.11)
RDW: 13.6 % (ref 11.5–15.5)
WBC: 5.2 K/uL (ref 4.0–10.5)

## 2023-11-08 LAB — TSH: TSH: 0.97 u[IU]/mL (ref 0.35–5.50)

## 2023-11-08 LAB — HEMOGLOBIN A1C: Hgb A1c MFr Bld: 5.9 % (ref 4.6–6.5)

## 2023-11-08 LAB — T4, FREE: Free T4: 0.87 ng/dL (ref 0.60–1.60)

## 2023-11-08 LAB — T3, FREE: T3, Free: 3.2 pg/mL (ref 2.3–4.2)

## 2023-11-08 NOTE — Patient Instructions (Addendum)
 We will call you within two weeks about your referral for CT calcium scoring through [facility].  Their phone number is [phone].  Please call them if you have not heard in 1-2 weeks  Please stop by lab before you go If you have mychart- we will send your results within 3 business days of us  receiving them.  If you do not have mychart- we will call you about results within 5 business days of us  receiving them.  *please also note that you will see labs on mychart as soon as they post. I will later go in and write notes on them- will say notes from Dr. Katrinka   Recommended follow up: Return in about 6 months (around 05/10/2024) for followup or sooner if needed.Schedule b4 you leave.

## 2023-11-08 NOTE — Progress Notes (Signed)
 Phone [phone]   Subjective:  Patient presents today for their annual physical. Chief complaint-noted.   See problem oriented charting- ROS- full  review of systems was completed and negative except for topics noted under acute/chronic concerns  The following were reviewed and entered/updated in epic: Past Medical History:  Diagnosis Date   Arthritis    cervical, lumbar, knees, feet. mild hands   Headache(784.0)    Hx: of occasinal Migraine   Heart palpitations    Hx: of   Hypertension    PONV (postoperative nausea and vomiting)    Thyroid  disease    Hyperthyroidism-Graves Disease   Patient Active Problem List   Diagnosis Date Noted   Multinodular goiter 02/01/2022    Priority: High   Hyperthyroidism 11/10/2021    Priority: High   Elevated LFTs 11/10/2021    Priority: High   Goiter 11/10/2021    Priority: High   Graves disease 07/21/2021    Priority: High   Hyperlipidemia 07/19/2017    Priority: Medium    Hyperglycemia 07/19/2017    Priority: Medium    Palpitations 05/28/2017    Priority: Medium    Vertigo 05/28/2017    Priority: Medium    Hypertension 05/02/2016    Priority: Medium    Diffuse pain 05/02/2016    Priority: Medium    Arthritis     Priority: Medium    Neuropathy 09/25/2018    Priority: Low   Adhesive capsulitis of left shoulder 01/30/2017    Priority: Low   Family history of colon cancer 05/02/2016    Priority: Low   Microscopic hematuria 10/16/2011    Priority: Low   Past Surgical History:  Procedure Laterality Date   APPENDECTOMY     CESAREAN SECTION  1990   CHOLECYSTECTOMY N/A 02/28/2013   Procedure: LAPAROSCOPIC CHOLECYSTECTOMY;  Surgeon: Lynda Leos, MD;  Location: [facility] OR;  Service: General;  Laterality: N/A;   COLONOSCOPY W/ POLYPECTOMY     Hx: of   KNEE SURGERY     Left knee - meniscus tear   LAPAROSCOPIC APPENDECTOMY N/A 08/05/2012   Procedure: APPENDECTOMY LAPAROSCOPIC;  Surgeon: Debby LABOR. Cornett, MD;  Location: [facility]  ORS;  Service: General;  Laterality: N/A;   wisdom teeth      Family History  Problem Relation Age of Onset   Uterine cancer Mother        presented with bleeding   Hypertension Mother    Pancreatic cancer Father    Cancer - Colon Brother    Stroke Maternal Grandmother         late 5s   Lung cancer Maternal Grandfather    Heart attack Paternal Grandmother        late 76s   Heart attack Paternal Grandfather        died 24    Medications- reviewed and updated Current Outpatient Medications  Medication Sig Dispense Refill   calcium-vitamin D  (OSCAL WITH D) 500-5 MG-MCG tablet Take 1 tablet by mouth daily with breakfast.     diazepam  (VALIUM ) 5 MG tablet Take 1 tablet (5 mg total) by mouth every 12 (twelve) hours as needed for anxiety (do not drive for 12 hours after taking). 11 tablet 0   FLUoxetine (PROZAC) 20 MG capsule Take 20 mg by mouth daily.     folic acid (FOLVITE) 1 MG tablet Take 1 mg by mouth daily.     gabapentin (NEURONTIN) 300 MG capsule Take 600 mg by mouth at bedtime. Take 2 tablets by mouth daily at  bedtime     LINZESS 290 MCG CAPS capsule Take 290 mcg by mouth every morning.     MAGNESIUM GLUCONATE PO Take 2 tablets by mouth daily.     meclizine (ANTIVERT) 12.5 MG tablet Take 12.5 mg by mouth 2 (two) times daily as needed.     methotrexate (RHEUMATREX) 2.5 MG tablet Take 10 mg by mouth once a week. Pt takes 8 tablets once a week     metoprolol  succinate (TOPROL -XL) 50 MG 24 hr tablet TAKE 1 TABLET(50 MG) BY MOUTH IN THE MORNING AND AT BEDTIME WITH OR IMMEDIATELY FOLLOWING A MEAL 180 tablet 3   nystatin  (MYCOSTATIN ) 100000 UNIT/ML suspension Take 2 mLs (200,000 Units total) by mouth 4 (four) times daily. Swish and spit out 60 mL 0   tiZANidine (ZANAFLEX) 4 MG tablet Take 4 mg by mouth once.     traMADol (ULTRAM) 50 MG tablet Take 50 mg by mouth every 6 (six) hours as needed for severe pain (pain score 7-10).     vitamin B-12 (CYANOCOBALAMIN ) 100 MCG tablet Take 100  mcg by mouth daily.     Vitamin D , Cholecalciferol, 25 MCG (1000 UT) CAPS Take 1 capsule by mouth daily in the afternoon.     No current facility-administered medications for this visit.    Allergies-reviewed and updated Allergies  Allergen Reactions   Meperidine Hcl Nausea And Vomiting   Penicillins Itching    Pt states this may be an error and that she was given a derivative of PCN with no reactions at all.    Social History   Social History Narrative   Married 1980. Debby is husband Marketing executive).3 kids Debby Raddle. Works at [employer] lives with parents, Ronal Craven single living with parents works at [employer], Psychologist, counselling works at [employer] and in school in [place] living with parents.       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 Oropharynx normal Neck: mild thyromegaly CV: RRR no murmurs rubs or gallops Lungs: CTAB no crackles, wheeze, rhonchi Abdomen: soft/nontender/nondistended/normal bowel sounds. No rebound or guarding.  Ext: minila edema Skin: warm, dry Neuro: grossly normal, moves all extremities, PERRLA, moves at much slower pace compared to last year phsyical   Assessment and Plan   63 y.o. female presenting for annual physical.  Health Maintenance counseling: 1. Anticipatory guidance: Patient counseled regarding regular dental exams -q6 months, eye exams ,  avoiding smoking and second hand smoke , limiting alcohol to 1 beverage per day- maybe 2 a week or less , no illicit drugs .   2. Risk factor reduction:  Advised patient of need for regular exercise and diet rich and  fruits and vegetables to reduce risk of heart attack and stroke.  Exercise- due to joints walking hard, has been told basically to float in pool as overdoes it and when gets out everything compresses.  Diet/weight management-weight within 3 pounds of last year- focusing on getting body healthy and then can focus more on this. Plus not being able to walk has been  a barrier.  Wt Readings from Last 3 Encounters:  11/08/23 235 lb 3.2 oz (106.7 kg)  06/21/23 236 lb (107 kg)  05/31/23 223 lb (101.2 kg)  3. Immunizations/screenings/ancillary studies- recommend fall flu shot- she's a little hesitant, hold off on shingrix, Prevnar 20 - wants to hold for now. Opts out on COVID 19 Immunization History  Administered Date(s) Administered   Influenza,inj,Quad PF,6+ Mos 05/02/2016, 12/25/2020   Influenza-Unspecified 02/13/2019   Janssen (J&J) SARS-COV-2 Vaccination 06/19/2019   PFIZER(Purple Top)SARS-COV-2 Vaccination 03/17/2020   Tdap 05/02/2016   4. Cervical cancer screening- follows with Dr. Sarrah. Pap 04/05/22 with 5 year repeat as human papilloma virus negative and normal 5. Breast cancer screening-  breast exam with GYN and mammogram April 12 2023 up to date  6. Colon cancer screening - October 2020 and should get recall for October of this year as 5 years out 7. Skin cancer screening- prior skin surgery center- she can call to schedule with new provider. advised regular sunscreen use. Denies worrisome, changing, or new skin lesions.  8. Birth control/STD check- monogamous and postmenopausal 9. Osteoporosis screening at 55- had with GYN in past and told good readings. Osteopenia with Dr. Pennelope 10. Smoking associated screening - never smoker  Status of chronic or acute concerns     # Rheumatoid arthritis-seronegative.  ANA positive # FIbromyalgia history S: Diagnosed by Dr. Curt. Medication: Improving on methotrexate 8 tablets once weekly with folic acid daily -doing physical therapy and  helpful  - may journal symptoms  -reports more frequent migraines on methotrexate- was more sparing before  -mobility issues frustrating -has tramadol available  A/P: seems to be making progress but we discussed this can be a slower process and sometimes even requires change in medications- she's fully aware- continue methotrexate and conservative management as well   #also working with spine specialist- trying to see if methotrexate will help her. Upcoming nerve conduction study September 22. - hoping for more information  #Graves' disease/prior hyperthyroidism S: Presented with palpitations-seen by endocrinology Dr. Sam and started on methimazole  5 mg in the past-now off since July 2023 with endocrine input due to LFT elevations but LFTs have stabilized and thyroid  has remained in range A/P:hopefully stable- update TSH, t3, t4 today. Continue without medications  -also check LFTs with prior bump  #hypertension S: medication: metoprolol  50 mg XR--> 50  mg twice daily In consult with Dr. Marquette to help with overall tension A/P: stable- continue current medicines   #hyperlipidemia S: Medication:None The 10-year ASCVD risk score (Arnett DK, et al., 2019) is: 6.4%. PGF 55 heart disease  A/P: lipids with ascivd risk above 5% and some early family history heart disease- she prefers CT calcium- hold on medications until we get more info  #Idiopathic neuropathy in her feet-history of nerve conduction studies in the past with unclear cause but started after knee surgery. S: Medication: 600mg  at nighttrial for feet helped this some   -prior amitriptyline  -also will get a burning sensation in her body at times- resolved on gabapentin -Salicylic acid 6% solution for calluses was actually helpful A/P: some worsening over time- continue to monitor - hoping maybe more answers with nerve conduction study   #Vertigo-years and years of issues.  Therapy 2024 referred by Dr. Marquette (not benign  paroxysmal positional vertigo (BPPV))- has exercises. Good days and bad days. Does have some meclizine  #Constipation-has done better with healthier diet.  In the past has used an apple all day diet perhaps once  a week and that was helpful -linzess from gastroenterology  helpful   # Hyperglycemia/insulin resistance/prediabetes- a1c not elevated 2020 though at 5.1 intermittently has CBG elevations -Continue to trend CBGs yearly as well as a1c  Lab Results  Component Value Date   HGBA1C 5.5 10/12/2022   HGBA1C 5.1 09/25/2018   Recommended follow up: Return in about 6 months (around 05/10/2024) for followup or sooner if needed.Schedule b4 you leave.  Lab/Order associations: fasting   ICD-10-CM   1. Preventative health care  Z00.00     2. Graves disease  E05.00 TSH    T4, free    T3, free    3. Hyperthyroidism  E05.90 TSH    T4, free    T3, free    4. Primary hypertension  I10     5. Hyperlipidemia, unspecified hyperlipidemia type  E78.5 Comprehensive metabolic panel with GFR    CBC with Differential/Platelet    Lipid panel    CT CARDIAC SCORING ([facility] LOCATIONS ONLY)    6. Hyperglycemia  R73.9 Hemoglobin A1c    7. Screening for diabetes mellitus  Z13.1 Hemoglobin A1c      No orders of the defined types were placed in this encounter.   Return precautions advised.  Garnette Lukes, MD

## 2023-11-15 ENCOUNTER — Ambulatory Visit
Admission: RE | Admit: 2023-11-15 | Discharge: 2023-11-15 | Disposition: A | Discharge status: Home / Self Care | Source: Ambulatory Visit | Attending: Family Medicine | Admitting: Family Medicine

## 2023-11-15 DIAGNOSIS — E785 Hyperlipidemia, unspecified: Secondary | ICD-10-CM

## 2023-12-24 ENCOUNTER — Other Ambulatory Visit: Payer: Self-pay

## 2023-12-24 DIAGNOSIS — M25552 Pain in left hip: Secondary | ICD-10-CM

## 2024-01-01 ENCOUNTER — Other Ambulatory Visit

## 2024-01-11 ENCOUNTER — Ambulatory Visit
Admission: RE | Admit: 2024-01-11 | Discharge: 2024-01-11 | Disposition: A | Discharge status: Home / Self Care | Source: Ambulatory Visit

## 2024-01-11 DIAGNOSIS — M25552 Pain in left hip: Secondary | ICD-10-CM

## 2024-02-21 LAB — HM COLONOSCOPY

## 2024-05-15 ENCOUNTER — Ambulatory Visit: Admitting: Family Medicine

## 2024-11-13 ENCOUNTER — Encounter: Admitting: Family Medicine
